# Patient Record
Sex: Female | Born: 1940 | Race: White | Hispanic: No | Smoking: Former smoker
Health system: Southern US, Community
[De-identification: ages and names within clinical notes are randomized; demographics above are authoritative.]

## PROBLEM LIST (undated history)

## (undated) DIAGNOSIS — M1990 Unspecified osteoarthritis, unspecified site: Secondary | ICD-10-CM

## (undated) DIAGNOSIS — E119 Type 2 diabetes mellitus without complications: Secondary | ICD-10-CM

## (undated) DIAGNOSIS — I071 Rheumatic tricuspid insufficiency: Secondary | ICD-10-CM

## (undated) DIAGNOSIS — I509 Heart failure, unspecified: Secondary | ICD-10-CM

## (undated) DIAGNOSIS — I272 Pulmonary hypertension, unspecified: Secondary | ICD-10-CM

## (undated) DIAGNOSIS — J449 Chronic obstructive pulmonary disease, unspecified: Secondary | ICD-10-CM

## (undated) HISTORY — PX: CARDIAC SURGERY: SHX584

---

## 2019-08-10 ENCOUNTER — Other Ambulatory Visit: Payer: Self-pay

## 2019-08-10 ENCOUNTER — Inpatient Hospital Stay: Payer: Medicare Other

## 2019-08-10 ENCOUNTER — Emergency Department: Payer: Medicare Other

## 2019-08-10 ENCOUNTER — Inpatient Hospital Stay
Admission: EM | Admit: 2019-08-10 | Discharge: 2019-08-14 | DRG: 481 | Disposition: A | Discharge status: Skilled Nursing Facility | Payer: Medicare Other | Attending: Internal Medicine | Admitting: Internal Medicine

## 2019-08-10 DIAGNOSIS — Z86718 Personal history of other venous thrombosis and embolism: Secondary | ICD-10-CM

## 2019-08-10 DIAGNOSIS — S72009A Fracture of unspecified part of neck of unspecified femur, initial encounter for closed fracture: Secondary | ICD-10-CM | POA: Diagnosis present

## 2019-08-10 DIAGNOSIS — I071 Rheumatic tricuspid insufficiency: Secondary | ICD-10-CM | POA: Diagnosis present

## 2019-08-10 DIAGNOSIS — R55 Syncope and collapse: Secondary | ICD-10-CM | POA: Diagnosis present

## 2019-08-10 DIAGNOSIS — I5032 Chronic diastolic (congestive) heart failure: Secondary | ICD-10-CM | POA: Diagnosis present

## 2019-08-10 DIAGNOSIS — E119 Type 2 diabetes mellitus without complications: Secondary | ICD-10-CM | POA: Diagnosis present

## 2019-08-10 DIAGNOSIS — Z885 Allergy status to narcotic agent status: Secondary | ICD-10-CM | POA: Diagnosis not present

## 2019-08-10 DIAGNOSIS — Z8249 Family history of ischemic heart disease and other diseases of the circulatory system: Secondary | ICD-10-CM

## 2019-08-10 DIAGNOSIS — T402X5A Adverse effect of other opioids, initial encounter: Secondary | ICD-10-CM | POA: Diagnosis present

## 2019-08-10 DIAGNOSIS — Z7902 Long term (current) use of antithrombotics/antiplatelets: Secondary | ICD-10-CM

## 2019-08-10 DIAGNOSIS — I110 Hypertensive heart disease with heart failure: Secondary | ICD-10-CM | POA: Diagnosis present

## 2019-08-10 DIAGNOSIS — J9610 Chronic respiratory failure, unspecified whether with hypoxia or hypercapnia: Secondary | ICD-10-CM | POA: Diagnosis present

## 2019-08-10 DIAGNOSIS — Z7951 Long term (current) use of inhaled steroids: Secondary | ICD-10-CM

## 2019-08-10 DIAGNOSIS — S72002A Fracture of unspecified part of neck of left femur, initial encounter for closed fracture: Secondary | ICD-10-CM

## 2019-08-10 DIAGNOSIS — Z87891 Personal history of nicotine dependence: Secondary | ICD-10-CM

## 2019-08-10 DIAGNOSIS — J449 Chronic obstructive pulmonary disease, unspecified: Secondary | ICD-10-CM | POA: Diagnosis present

## 2019-08-10 DIAGNOSIS — I272 Pulmonary hypertension, unspecified: Secondary | ICD-10-CM | POA: Diagnosis present

## 2019-08-10 DIAGNOSIS — Z888 Allergy status to other drugs, medicaments and biological substances status: Secondary | ICD-10-CM

## 2019-08-10 DIAGNOSIS — W010XXA Fall on same level from slipping, tripping and stumbling without subsequent striking against object, initial encounter: Secondary | ICD-10-CM | POA: Diagnosis present

## 2019-08-10 DIAGNOSIS — S72142A Displaced intertrochanteric fracture of left femur, initial encounter for closed fracture: Secondary | ICD-10-CM | POA: Diagnosis present

## 2019-08-10 DIAGNOSIS — S7222XA Displaced subtrochanteric fracture of left femur, initial encounter for closed fracture: Secondary | ICD-10-CM | POA: Diagnosis present

## 2019-08-10 DIAGNOSIS — Z6839 Body mass index (BMI) 39.0-39.9, adult: Secondary | ICD-10-CM | POA: Diagnosis not present

## 2019-08-10 DIAGNOSIS — Z20828 Contact with and (suspected) exposure to other viral communicable diseases: Secondary | ICD-10-CM | POA: Diagnosis present

## 2019-08-10 DIAGNOSIS — L299 Pruritus, unspecified: Secondary | ICD-10-CM | POA: Diagnosis present

## 2019-08-10 DIAGNOSIS — Z79899 Other long term (current) drug therapy: Secondary | ICD-10-CM

## 2019-08-10 DIAGNOSIS — Z794 Long term (current) use of insulin: Secondary | ICD-10-CM | POA: Diagnosis not present

## 2019-08-10 HISTORY — DX: Heart failure, unspecified: I50.9

## 2019-08-10 HISTORY — DX: Rheumatic tricuspid insufficiency: I07.1

## 2019-08-10 HISTORY — DX: Chronic obstructive pulmonary disease, unspecified: J44.9

## 2019-08-10 HISTORY — DX: Pulmonary hypertension, unspecified: I27.20

## 2019-08-10 HISTORY — DX: Unspecified osteoarthritis, unspecified site: M19.90

## 2019-08-10 HISTORY — DX: Type 2 diabetes mellitus without complications: E11.9

## 2019-08-10 LAB — CBC WITH DIFFERENTIAL/PLATELET
Abs Immature Granulocytes: 0.08 10*3/uL — ABNORMAL HIGH (ref 0.00–0.07)
Basophils Absolute: 0 10*3/uL (ref 0.0–0.1)
Basophils Relative: 0 %
Eosinophils Absolute: 0.2 10*3/uL (ref 0.0–0.5)
Eosinophils Relative: 2 %
HCT: 34.1 % — ABNORMAL LOW (ref 36.0–46.0)
Hemoglobin: 10.2 g/dL — ABNORMAL LOW (ref 12.0–15.0)
Immature Granulocytes: 1 %
Lymphocytes Relative: 15 %
Lymphs Abs: 1.6 10*3/uL (ref 0.7–4.0)
MCH: 24.8 pg — ABNORMAL LOW (ref 26.0–34.0)
MCHC: 29.9 g/dL — ABNORMAL LOW (ref 30.0–36.0)
MCV: 83 fL (ref 80.0–100.0)
Monocytes Absolute: 0.9 10*3/uL (ref 0.1–1.0)
Monocytes Relative: 8 %
Neutro Abs: 8 10*3/uL — ABNORMAL HIGH (ref 1.7–7.7)
Neutrophils Relative %: 74 %
Platelets: 220 10*3/uL (ref 150–400)
RBC: 4.11 MIL/uL (ref 3.87–5.11)
RDW: 16.9 % — ABNORMAL HIGH (ref 11.5–15.5)
WBC: 10.7 10*3/uL — ABNORMAL HIGH (ref 4.0–10.5)
nRBC: 0 % (ref 0.0–0.2)

## 2019-08-10 LAB — APTT: aPTT: 33 seconds (ref 24–36)

## 2019-08-10 LAB — BASIC METABOLIC PANEL
Anion gap: 11 (ref 5–15)
BUN: 20 mg/dL (ref 8–23)
CO2: 26 mmol/L (ref 22–32)
Calcium: 8.9 mg/dL (ref 8.9–10.3)
Chloride: 101 mmol/L (ref 98–111)
Creatinine, Ser: 1.35 mg/dL — ABNORMAL HIGH (ref 0.44–1.00)
GFR calc Af Amer: 43 mL/min — ABNORMAL LOW (ref >60)
GFR calc non Af Amer: 37 mL/min — ABNORMAL LOW (ref >60)
Glucose, Bld: 188 mg/dL — ABNORMAL HIGH (ref 70–99)
Potassium: 4.4 mmol/L (ref 3.5–5.1)
Sodium: 138 mmol/L (ref 135–145)

## 2019-08-10 LAB — SURGICAL PCR SCREEN
MRSA, PCR: NEGATIVE
Staphylococcus aureus: POSITIVE — AB

## 2019-08-10 LAB — TYPE AND SCREEN
ABO/RH(D): O POS
Antibody Screen: NEGATIVE

## 2019-08-10 LAB — TROPONIN I (HIGH SENSITIVITY): Troponin I (High Sensitivity): 14 ng/L (ref <18)

## 2019-08-10 LAB — SARS CORONAVIRUS 2 BY RT PCR (HOSPITAL ORDER, PERFORMED IN ~~LOC~~ HOSPITAL LAB): SARS Coronavirus 2: NEGATIVE

## 2019-08-10 LAB — PROTIME-INR
INR: 1.1 (ref 0.8–1.2)
Prothrombin Time: 14.4 seconds (ref 11.4–15.2)

## 2019-08-10 LAB — HEMOGLOBIN A1C
Hgb A1c MFr Bld: 8.3 % — ABNORMAL HIGH (ref 4.8–5.6)
Mean Plasma Glucose: 191.51 mg/dL

## 2019-08-10 LAB — GLUCOSE, CAPILLARY: Glucose-Capillary: 172 mg/dL — ABNORMAL HIGH (ref 70–99)

## 2019-08-10 MED ORDER — MORPHINE SULFATE (PF) 4 MG/ML IV SOLN
4.0000 mg | INTRAVENOUS | Status: DC | PRN
  Administered 2019-08-10: 4 mg via INTRAVENOUS
  Filled 2019-08-10: qty 1

## 2019-08-10 MED ORDER — CHLORHEXIDINE GLUCONATE CLOTH 2 % EX PADS
6.0000 | MEDICATED_PAD | Freq: Every day | CUTANEOUS | Status: DC
  Administered 2019-08-11 – 2019-08-14 (×2): 6 via TOPICAL

## 2019-08-10 MED ORDER — ONDANSETRON HCL 4 MG PO TABS: 4.0000 mg | ORAL_TABLET | Freq: Four times a day (QID) | ORAL | Status: DC | PRN

## 2019-08-10 MED ORDER — MORPHINE SULFATE (PF) 2 MG/ML IV SOLN
2.0000 mg | INTRAVENOUS | Status: DC | PRN
  Administered 2019-08-10: 2 mg via INTRAVENOUS
  Filled 2019-08-10: qty 1

## 2019-08-10 MED ORDER — INSULIN GLARGINE 100 UNIT/ML ~~LOC~~ SOLN
20.0000 [IU] | Freq: Every day | SUBCUTANEOUS | Status: DC
  Administered 2019-08-11: 20 [IU] via SUBCUTANEOUS
  Filled 2019-08-10 (×3): qty 0.2

## 2019-08-10 MED ORDER — MORPHINE SULFATE (PF) 2 MG/ML IV SOLN
2.0000 mg | INTRAVENOUS | Status: DC | PRN
  Administered 2019-08-10: 4 mg via INTRAVENOUS
  Administered 2019-08-11: 2 mg via INTRAVENOUS
  Filled 2019-08-10: qty 1
  Filled 2019-08-10: qty 2

## 2019-08-10 MED ORDER — OXYCODONE HCL 5 MG PO TABS
10.0000 mg | ORAL_TABLET | ORAL | Status: DC | PRN
  Administered 2019-08-10 – 2019-08-11 (×2): 10 mg via ORAL
  Filled 2019-08-10 (×2): qty 2

## 2019-08-10 MED ORDER — MUPIROCIN 2 % EX OINT
1.0000 "application " | TOPICAL_OINTMENT | Freq: Two times a day (BID) | CUTANEOUS | Status: DC
  Administered 2019-08-11 – 2019-08-14 (×5): 1 via NASAL
  Filled 2019-08-10: qty 22

## 2019-08-10 MED ORDER — BISACODYL 10 MG RE SUPP
10.0000 mg | Freq: Every day | RECTAL | Status: DC | PRN
  Administered 2019-08-14: 10 mg via RECTAL
  Filled 2019-08-10 (×2): qty 1

## 2019-08-10 MED ORDER — ACETAMINOPHEN 325 MG PO TABS
650.0000 mg | ORAL_TABLET | Freq: Four times a day (QID) | ORAL | Status: DC | PRN
  Administered 2019-08-12 – 2019-08-14 (×4): 650 mg via ORAL
  Filled 2019-08-10 (×4): qty 2

## 2019-08-10 MED ORDER — FENTANYL CITRATE (PF) 100 MCG/2ML IJ SOLN
50.0000 ug | INTRAMUSCULAR | Status: DC | PRN
  Administered 2019-08-10: 50 ug via INTRAVENOUS
  Filled 2019-08-10: qty 2

## 2019-08-10 MED ORDER — ACETAMINOPHEN 650 MG RE SUPP: 650.0000 mg | Freq: Four times a day (QID) | RECTAL | Status: DC | PRN

## 2019-08-10 MED ORDER — CEFAZOLIN SODIUM-DEXTROSE 2-4 GM/100ML-% IV SOLN
2.0000 g | Freq: Once | INTRAVENOUS | Status: AC
  Administered 2019-08-11: 08:00:00 2 g via INTRAVENOUS
  Filled 2019-08-10 (×2): qty 100

## 2019-08-10 MED ORDER — ONDANSETRON HCL 4 MG/2ML IJ SOLN
4.0000 mg | Freq: Four times a day (QID) | INTRAMUSCULAR | Status: DC | PRN
  Administered 2019-08-14: 4 mg via INTRAVENOUS

## 2019-08-10 MED ORDER — DOCUSATE SODIUM 100 MG PO CAPS
100.0000 mg | ORAL_CAPSULE | Freq: Two times a day (BID) | ORAL | Status: DC
  Administered 2019-08-10: 100 mg via ORAL
  Filled 2019-08-10 (×3): qty 1

## 2019-08-10 MED ORDER — ALBUTEROL SULFATE (2.5 MG/3ML) 0.083% IN NEBU: 2.5000 mg | INHALATION_SOLUTION | RESPIRATORY_TRACT | Status: DC | PRN

## 2019-08-10 MED ORDER — POLYETHYLENE GLYCOL 3350 17 G PO PACK
17.0000 g | PACK | Freq: Every day | ORAL | Status: DC | PRN
  Administered 2019-08-14: 17 g via ORAL
  Filled 2019-08-10: qty 1

## 2019-08-10 MED ORDER — INSULIN ASPART 100 UNIT/ML ~~LOC~~ SOLN
0.0000 [IU] | Freq: Three times a day (TID) | SUBCUTANEOUS | Status: DC
  Administered 2019-08-11: 3 [IU] via SUBCUTANEOUS
  Administered 2019-08-11: 7 [IU] via SUBCUTANEOUS
  Administered 2019-08-11: 5 [IU] via SUBCUTANEOUS
  Administered 2019-08-12 (×2): 7 [IU] via SUBCUTANEOUS
  Administered 2019-08-12: 5 [IU] via SUBCUTANEOUS
  Administered 2019-08-13 (×3): 2 [IU] via SUBCUTANEOUS
  Administered 2019-08-14: 3 [IU] via SUBCUTANEOUS
  Filled 2019-08-10 (×9): qty 1

## 2019-08-10 MED ORDER — ONDANSETRON HCL 4 MG/2ML IJ SOLN
4.0000 mg | INTRAMUSCULAR | Status: DC | PRN
  Administered 2019-08-10: 4 mg via INTRAVENOUS
  Filled 2019-08-10 (×2): qty 2

## 2019-08-10 MED ORDER — OXYCODONE HCL 5 MG PO TABS
5.0000 mg | ORAL_TABLET | ORAL | Status: DC | PRN
  Filled 2019-08-10: qty 1

## 2019-08-10 MED ORDER — INSULIN ASPART 100 UNIT/ML ~~LOC~~ SOLN
0.0000 [IU] | Freq: Every day | SUBCUTANEOUS | Status: DC
  Administered 2019-08-12: 3 [IU] via SUBCUTANEOUS
  Filled 2019-08-10 (×2): qty 1

## 2019-08-10 NOTE — ED Notes (Signed)
Pharmacy tech at bedside 

## 2019-08-10 NOTE — ED Notes (Signed)
Pt made aware bed available upstairs. Pt given graham crackers at bedside. Pt states "I got to sleep for a little bit" and made comfortable with blankets.

## 2019-08-10 NOTE — ED Notes (Signed)
Spoke with pt significant other on phone to confirm he is coming to hospital to visit pt per pt request.

## 2019-08-10 NOTE — ED Notes (Signed)
Rolling call given to 1A.

## 2019-08-10 NOTE — Consult Note (Addendum)
Reason for Consult: Left comminuted hip fracture Referring Physician: ER staff  Destiny Zuniga is an 78 y.o. female.  HPI: Patient is a 78 year old with multiple medical problems who was traveling from [facility] meeting up with daughter who had recent surgery.  She went to the bathroom and had a syncopal episode and was unable to bear weight.  She brought to the emergency room and found to have comminuted left reverse obliquity subtrochanteric hip fracture.  She reports that she normally is a minimal community ambulator she does not use a walker or cane but when she goes encouraged her she leans on the cart to ambulate.  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  She is on Plavix as well.  Past Medical History:  Diagnosis Date  . Arthritis   . CHF (congestive heart failure) ([place])   . COPD (chronic obstructive pulmonary disease) ([place])   . Diabetes mellitus without complication [facility])     Past Surgical History:  Procedure Laterality Date  . CARDIAC SURGERY      No family history on file.  Social History:  reports that she has quit smoking. She has never used smokeless tobacco. She reports previous alcohol use. She reports that she does not use drugs.  Allergies: Not on File  Medications: I have reviewed the patient's current medications.  Results for orders placed or performed during the hospital encounter of 08/10/19 (from the past 48 hour(s))  Basic metabolic panel     Status: Abnormal   Collection Time: 08/10/19 11:40 AM  Result Value Ref Range   Sodium 138 135 - 145 mmol/L   Potassium 4.4 3.5 - 5.1 mmol/L   Chloride 101 98 - 111 mmol/L   CO2 26 22 - 32 mmol/L   Glucose, Bld 188 (H) 70 - 99 mg/dL   BUN 20 8 - 23 mg/dL   Creatinine, Ser 1.35 (H) 0.44 - 1.00 mg/dL   Calcium 8.9 8.9 - 10.3 mg/dL   GFR calc non Af Amer 37 (L) >60 mL/min   GFR calc Af Amer 43 (L) >60 mL/min   Anion gap 11 5 - 15    Comment:  Performed at [facility], [address] [place], [place] [address]  CBC WITH DIFFERENTIAL     Status: Abnormal   Collection Time: 08/10/19 11:40 AM  Result Value Ref Range   WBC 10.7 (H) 4.0 - 10.5 K/uL   RBC 4.11 3.87 - 5.11 MIL/uL   Hemoglobin 10.2 (L) 12.0 - 15.0 g/dL   HCT 34.1 (L) 36.0 - 46.0 %   MCV 83.0 80.0 - 100.0 fL   MCH 24.8 (L) 26.0 - 34.0 pg   MCHC 29.9 (L) 30.0 - 36.0 g/dL   RDW 16.9 (H) 11.5 - 15.5 %   Platelets 220 150 - 400 K/uL   nRBC 0.0 0.0 - 0.2 %   Neutrophils Relative % 74 %   Neutro Abs 8.0 (H) 1.7 - 7.7 K/uL   Lymphocytes Relative 15 %   Lymphs Abs 1.6 0.7 - 4.0 K/uL   Monocytes Relative 8 %   Monocytes Absolute 0.9 0.1 - 1.0 K/uL   Eosinophils Relative 2 %   Eosinophils Absolute 0.2 0.0 - 0.5 K/uL   Basophils Relative 0 %   Basophils Absolute 0.0 0.0 - 0.1 K/uL   Immature Granulocytes 1 %   Abs Immature Granulocytes 0.08 (H) 0.00 - 0.07 K/uL    Comment: Performed  at [facility], [address] [place], [place] [address]  Protime-INR     Status: None   Collection Time: 08/10/19 11:40 AM  Result Value Ref Range   Prothrombin Time 14.4 11.4 - 15.2 seconds   INR 1.1 0.8 - 1.2    Comment: (NOTE) INR goal varies based on device and disease states. Performed at [facility], [address] [place], [place] [address]   Type and screen [facility]     Status: None   Collection Time: 08/10/19 11:40 AM  Result Value Ref Range   ABO/RH(D) O POS    Antibody Screen NEG    Sample Expiration      08/13/2019,2359 Performed at [facility], [address] [place], [place] [address]   APTT     Status: None   Collection Time: 08/10/19 11:40 AM  Result Value Ref Range   aPTT 33 24 - 36 seconds    Comment: Performed at [facility], [address] [place], [place] [address]    Dg Hip Unilat With Pelvis 2-3 Views Left  Result Date: 08/10/2019 CLINICAL DATA:  Left hip pain you a  fall while trying to open a door today. Initial encounter. EXAM: DG HIP (WITH OR WITHOUT PELVIS) 2-3V LEFT COMPARISON:  None. FINDINGS: The patient has an intertrochanteric fracture on the left with subtrochanteric extension. The left femoral head is located. No other acute abnormality seen. IMPRESSION: Acute left intertrochanteric fracture with subtrochanteric extension. Electronically Signed   By: Drusilla Kanner M.D.   On: 08/10/2019 12:28    ROS Blood pressure (!) 143/68, pulse (!) 54, temperature (!) 97.5 F (36.4 C), temperature source Oral, resp. rate 14, height 5\' 2"  (1.575 m), weight 98 kg, SpO2 99 %. Physical Exam  Left leg is shortened and externally rotated with 2+ pitting edema in both lower extremities.  Trace dorsalis pedis pulse nonpalpable posterior tib pulse.  Skin is intact around the hip.  No ecchymosis noted.  She is able to flex extend the toes and has intact sensation to the foot. Radiographs show comminuted reverse obliquity subtrochanteric troches fracture with lesser trochanter also fractured and displaced.  Assessment/Plan: Unstable fracture hip fracture pattern.  I reviewed the x-rays with the patient and discussed surgical intervention that this is a fracture that is slow in healing and has potential for displacement will plan on ORIF tomorrow morning with intramedullary device and probably cerclage wire.  Risk of surgery including blood clot were discussed and she does have a history of blood clots in the past. Site marked, left proximal femur.  08/10/2019, 12:55 PM

## 2019-08-10 NOTE — H&P (Signed)
[place] [facility] [place] NAME: Destiny Zuniga    MR#:  [id number]  DATE OF BIRTH:  02-May-1941  DATE OF ADMISSION:  08/10/2019  PRIMARY CARE PHYSICIAN: System, Pcp Not In   REQUESTING/REFERRING PHYSICIAN: Dr. Charna Archer  CHIEF COMPLAINT:   Chief Complaint  Patient presents with  . Fall    HISTORY OF PRESENT ILLNESS:  Destiny Zuniga  is a 78 y.o. female with a known history of diabetes mellitus, COPD, chronic respiratory failure, diastolic CHF, tricuspid regurgitation, moderate to severe pulmonary hypertension, chronic shortness of breath and dizziness presents to the emergency room after she fell landing on her left hip.  Patient was stepping over the curb and reaching for the door got lightheaded and fell.  Loss consciousness.  Unclear if she hit her head.  Presently feels back to normal.  Found to have left hip fracture.  Surgery scheduled for tomorrow with orthopedics.  Patient has had chronic shortness of breath on exertion and lightheadedness.  She follows at [facility] with cardiology and pulmonary.  Symptoms were thought to be due to tricuspid regurgitation and pulmonary hypertension with chronic shortness of breath.  PAST MEDICAL HISTORY:   Past Medical History:  Diagnosis Date  . Arthritis   . CHF (congestive heart failure) ([place])   . COPD (chronic obstructive pulmonary disease) ([place])   . Diabetes mellitus without complication ([place])   . Pulmonary hypertension ([place])   . Tricuspid regurgitation     PAST SURGICAL HISTORY:   Past Surgical History:  Procedure Laterality Date  . CARDIAC SURGERY      SOCIAL HISTORY:   Social History   Tobacco Use  . Smoking status: Former Research scientist (life sciences)  . Smokeless tobacco: Never Used  Substance Use Topics  . Alcohol use: Not Currently    FAMILY HISTORY:  History reviewed. No pertinent family history. Mother - heart disease DRUG ALLERGIES:  Not on File  REVIEW OF SYSTEMS:   Review of Systems   Constitutional: Positive for malaise/fatigue. Negative for chills, fever and weight loss.  HENT: Negative for hearing loss and nosebleeds.   Eyes: Negative for blurred vision, double vision and pain.  Respiratory: Positive for shortness of breath (chronic). Negative for cough, hemoptysis, sputum production and wheezing.   Cardiovascular: Negative for chest pain, palpitations, orthopnea and leg swelling.  Gastrointestinal: Negative for abdominal pain, constipation, diarrhea, nausea and vomiting.  Genitourinary: Negative for dysuria and hematuria.  Musculoskeletal: Positive for falls and joint pain. Negative for back pain and myalgias.  Skin: Negative for rash.  Neurological: Negative for dizziness, tremors, sensory change, speech change, focal weakness, seizures and headaches.  Endo/Heme/Allergies: Does not bruise/bleed easily.  Psychiatric/Behavioral: Negative for depression and memory loss. The patient is not nervous/anxious.     MEDICATIONS AT HOME:   Prior to Admission medications   Not on File     VITAL SIGNS:  Blood pressure (!) 143/68, pulse (!) 54, temperature (!) 97.5 F (36.4 C), temperature source Oral, resp. rate 14, height 5\' 2"  (1.575 m), weight 98 kg, SpO2 99 %.  PHYSICAL EXAMINATION:  Physical Exam  GENERAL:  78 y.o.-year-old patient lying in the bed with no acute distress.  Obese EYES: Pupils equal, round, reactive to light and accommodation. No scleral icterus. Extraocular muscles intact.  HEENT: Head atraumatic, normocephalic. Oropharynx and nasopharynx clear. No oropharyngeal erythema, moist oral mucosa  NECK:  Supple, no jugular venous distention. No thyroid enlargement, no tenderness.  LUNGS: Normal breath sounds bilaterally, no wheezing, rales,  rhonchi. No use of accessory muscles of respiration.  CARDIOVASCULAR: S1, S2 normal. No murmurs, rubs, or gallops.  ABDOMEN: Soft, nontender, nondistended. Bowel sounds present. No organomegaly or mass.  EXTREMITIES:  No pedal edema, cyanosis, or clubbing. + 2 pedal & radial pulses b/l.   NEUROLOGIC: Cranial nerves II through XII are intact. No focal Motor or sensory deficits appreciated b/l PSYCHIATRIC: The patient is alert and oriented x 3. Good affect.  SKIN: No obvious rash, lesion, or ulcer.   LABORATORY PANEL:   CBC Recent Labs  Lab 08/10/19 1140  WBC 10.7*  HGB 10.2*  HCT 34.1*  PLT 220   ------------------------------------------------------------------------------------------------------------------  Chemistries  Recent Labs  Lab 08/10/19 1140  NA 138  K 4.4  CL 101  CO2 26  GLUCOSE 188*  BUN 20  CREATININE 1.35*  CALCIUM 8.9   ------------------------------------------------------------------------------------------------------------------  Cardiac Enzymes No results for input(s): TROPONINI in the last 168 hours. ------------------------------------------------------------------------------------------------------------------  RADIOLOGY:  Dg Hip Unilat With Pelvis 2-3 Views Left  Result Date: 08/10/2019 CLINICAL DATA:  Left hip pain you a fall while trying to open a door today. Initial encounter. EXAM: DG HIP (WITH OR WITHOUT PELVIS) 2-3V LEFT COMPARISON:  None. FINDINGS: The patient has an intertrochanteric fracture on the left with subtrochanteric extension. The left femoral head is located. No other acute abnormality seen. IMPRESSION: Acute left intertrochanteric fracture with subtrochanteric extension. Electronically Signed   By: Drusilla Kanner M.D.   On: 08/10/2019 12:28     IMPRESSION AND PLAN:   * Acute left intertrochanteric fracture with subtrochanteric extension. Discussed with Dr. Rosita Kea of orthopedics.  Surgery scheduled for tomorrow 8 AM. No contraindications for surgery.  Would be moderate risk secondary to her moderate to severe pulmonary hypertension and chronic respiratory failure with COPD. Pain medications as needed Physical therapy after surgery SNF  at discharge SCDs for DVT prophylaxis at this time.  Lovenox or heparin per orthopedic team after surgery  *Syncope.  Patient has chronic lightheadedness and presyncope.  Today she fell and lost consciousness.  Will check CT head as it is unclear if she hit her head or not.  Will need outpatient follow-up with cardiology to finish work-up.  *Diabetes mellitus.  Sliding scale insulin added.  Diabetic diet.  We will continue Lantus from home.  *Hypertension.  Continue home medications  *Chronic diastolic CHF.  No signs of fluid overload.  Continue home medications when available  *COPD with chronic respiratory failure.  Continue oxygen, nebulizers and inhalers  All the records are reviewed and case discussed with ED provider. Management plans discussed with the patient, family and they are in agreement.  CODE STATUS: Full code  TOTAL TIME TAKING CARE OF THIS PATIENT: 40 minutes.   Molinda Bailiff Tereasa Yilmaz M.D on 08/10/2019 at 1:13 PM  Between 7am to 6pm - Pager - [phone]  After 6pm go to [email or web address] - password [email or web address]  [facility]  Office  [phone]  CC: Primary care physician; System, Pcp Not In  Note: This dictation was prepared with Dragon dictation along with smaller phrase technology. Any transcriptional errors that result from this process are unintentional.

## 2019-08-10 NOTE — ED Notes (Signed)
Attempted to call floor to give report. Nurse went in to change PICC dressing per secretary Helene Kelp and will call back when done. This RN's phone number given to [place].

## 2019-08-10 NOTE — ED Notes (Signed)
Admitting MD and Orthopedic surgeon at bedside at this time.

## 2019-08-10 NOTE — Progress Notes (Signed)
Advance care planning  Purpose of Encounter Left hip fracture  Parties in Attendance Patient  Patients Decisional capacity Alert and oriented.  Able to make medical decisions.  Documented healthcare power of attorney is her significant other Destiny Zuniga.  Discussed in detail regarding left hip fracture, complications.  Treatment plan , prognosis discussed.  All questions answered  CODE STATUS discussed.  Patient wants to wait and discuss discuss further with her significant other and think about it prior to making a decision.  Orders entered and full CODE STATUS ordered.  FULL CODE  Time spent - 17 minutes

## 2019-08-10 NOTE — ED Notes (Signed)
Patient transported to X-ray 

## 2019-08-10 NOTE — ED Triage Notes (Signed)
Pt arrives via EMS with cc of fall. Witnessed by boyfriend. Pt reports falling while trying to open the door but "doesn't remember between just came to on the ground". Witness reports pt fell while stepping over curb and opening door.  BP 145/69  BS 133  O2 98% on 2L pt uses oxygen at home P 66 T 98.6 oral  Pt reports taking 100mg  tramadol at 0800. Pt takes this medication morning and night.

## 2019-08-10 NOTE — ED Notes (Signed)
Admitting MD made aware of pt c/o 10/10 pain after IV morphine. This RN requested possible change in dose/frequency of IV medication to treat severe pain. Pt made aware of reason for delay in receiving pain meds.

## 2019-08-10 NOTE — ED Provider Notes (Signed)
[facility] Emergency Department Provider Note   ____________________________________________   First MD Initiated Contact with Patient 08/10/19 1202     (approximate)  I have reviewed the triage vital signs and the nursing notes.   HISTORY  Chief Complaint Fall    HPI Destiny Zuniga is a 78 y.o. female with past medical history of CHF, COPD on 2 L nasal cannula, and diabetes presents to the ED following syncopal episode and fall.  Patient reports she has had intermittent episodes of lightheadedness and near syncope for about the past 2 months.  She again had an episode today [facility], attempted to steady herself on a door but slipped and fell to the ground.  She reports hitting her left hip and complains of severe pain there, but denies hitting her head.  She does not believe she fully lost consciousness.  She denies any headache, neck pain, upper extremity pain, chest pain, or abdominal pain.  She has been unable to bear weight on her left leg since the fall and was transported to the ED via EMS.        Past Medical History:  Diagnosis Date  . Arthritis   . CHF (congestive heart failure) (HCC)   . COPD (chronic obstructive pulmonary disease) (HCC)   . Diabetes mellitus without complication (HCC)   . Pulmonary hypertension (HCC)   . Tricuspid regurgitation     Patient Active Problem List   Diagnosis Date Noted  . Hip fracture (HCC) 08/10/2019    Past Surgical History:  Procedure Laterality Date  . CARDIAC SURGERY      Prior to Admission medications   Medication Sig Start Date End Date Taking? Authorizing Provider  albuterol (VENTOLIN HFA) 108 (90 Base) MCG/ACT inhaler Inhale 2 puffs into the lungs every 6 (six) hours as needed for wheezing or shortness of breath. 05/15/19  Yes [provider]  carvedilol (COREG) 3.125 MG tablet Take 3.125 mg by mouth 2 (two) times daily. 12/25/18 12/25/19 Yes [provider]   cetirizine (ZYRTEC) 10 MG tablet Take 10 mg by mouth daily as needed for allergies.    Yes [provider]  clopidogrel (PLAVIX) 75 MG tablet Take 75 mg by mouth daily. 02/10/19  Yes [provider]  diclofenac sodium (VOLTAREN) 1 % GEL Apply 4 g topically 3 (three) times daily as needed (joint pain). (apply to hands and knees) 04/04/19  Yes [provider]  fluticasone (FLONASE) 50 MCG/ACT nasal spray Place 2 sprays into both nostrils daily. 12/06/18  Yes [provider]  Fluticasone-Umeclidin-Vilant (TRELEGY ELLIPTA) 100-62.5-25 MCG/INH AEPB Inhale 1 puff into the lungs daily.   Yes [provider]  furosemide (LASIX) 20 MG tablet Take 40 mg by mouth 2 (two) times daily.  01/08/19  Yes [provider]  gabapentin (NEURONTIN) 600 MG tablet Take 1,200-1,800 mg by mouth See admin instructions. Take 2 tablets (1200mg ) by mouth every morning and take 3 tablets (1800mg ) by mouth every night 11/14/18  Yes [provider]  insulin aspart (NOVOLOG) 100 UNIT/ML injection Inject 20-30 Units into the skin 3 (three) times daily with meals. (plus adjustments as directed) 03/23/19  Yes [provider]  Insulin Detemir (LEVEMIR) 100 UNIT/ML Pen Inject 25 Units into the skin daily with supper. 07/12/19  Yes [provider]  isosorbide mononitrate (IMDUR) 30 MG 24 hr tablet Take 30 mg by mouth 2 (two) times daily.  03/06/19  Yes [provider]  levothyroxine (SYNTHROID) 125 MCG  tablet Take 125 mcg by mouth daily. 02/19/19  Yes [provider]  losartan (COZAAR) 100 MG tablet Take 100 mg by mouth daily. 12/15/18  Yes [provider]  nitroGLYCERIN (NITROLINGUAL) 0.4 MG/SPRAY spray Place 1 spray under the tongue every 5 (five) minutes as needed for chest pain. 12/20/18  Yes [provider]  omeprazole (PRILOSEC) 20 MG capsule Take 20 mg by mouth daily. 12/15/18  Yes [provider]  potassium chloride  (KLOR-CON) 10 MEQ tablet Take 10 mEq by mouth daily. 04/03/19  Yes [provider]  simvastatin (ZOCOR) 20 MG tablet Take 20 mg by mouth daily. 01/21/19  Yes [provider]  traMADol (ULTRAM) 50 MG tablet Take 50-100 mg by mouth every 6 (six) hours as needed for moderate pain.  07/27/19 08/26/19 Yes [provider]    Allergies Codeine, Celecoxib, Midazolam, and Lisinopril  Family History  Problem Relation Age of Onset  . Heart disease Mother     Social History Social History   Tobacco Use  . Smoking status: Former Games developer  . Smokeless tobacco: Never Used  Substance Use Topics  . Alcohol use: Not Currently  . Drug use: Never    Review of Systems  Constitutional: No fever/chills Eyes: No visual changes. ENT: No sore throat. Cardiovascular: Denies chest pain.  Positive for syncope. Respiratory: Denies shortness of breath. Gastrointestinal: No abdominal pain.  No nausea, no vomiting.  No diarrhea.  No constipation. Genitourinary: Negative for dysuria. Musculoskeletal: Negative for back pain.  Positive for left hip pain. Skin: Negative for rash. Neurological: Negative for headaches, focal weakness or numbness.  ____________________________________________   PHYSICAL EXAM:  VITAL SIGNS: ED Triage Vitals  Enc Vitals Group     BP 08/10/19 1138 (!) 143/68     Pulse Rate 08/10/19 1138 (!) 54     Resp 08/10/19 1138 14     Temp 08/10/19 1138 (!) 97.5 F (36.4 C)     Temp Source 08/10/19 1138 Oral     SpO2 08/10/19 1138 99 %     Weight 08/10/19 1140 216 lb (98 kg)     Height 08/10/19 1140 5\' 2"  (1.575 m)     Head Circumference --      Peak Flow --      Pain Score 08/10/19 1139 10     Pain Loc --      Pain Edu? --      Excl. in GC? --     Constitutional: Alert and oriented. Eyes: Conjunctivae are normal. Head: Atraumatic. Nose: No congestion/rhinnorhea. Mouth/Throat: Mucous membranes are moist. Neck: Normal ROM, no midline cervical spine  tenderness. Cardiovascular: Normal rate, regular rhythm. Grossly normal heart sounds. Respiratory: Normal respiratory effort.  No retractions. Lungs CTAB. Gastrointestinal: Soft and nontender. No distention. Genitourinary: deferred Musculoskeletal: Range of motion at left hip limited secondary to pain.  Left lower extremity is shortened and externally rotated.  2+ DP pulses bilaterally with sensation intact to bilateral lower extremities. Neurologic:  Normal speech and language. No gross focal neurologic deficits are appreciated. Skin:  Skin is warm, dry and intact. No rash noted. Psychiatric: Mood and affect are normal. Speech and behavior are normal.  ____________________________________________   LABS (all labs ordered are listed, but only abnormal results are displayed)  Labs Reviewed  BASIC METABOLIC PANEL - Abnormal; Notable for the following components:      Result Value   Glucose, Bld 188 (*)    Creatinine, Ser 1.35 (*)    GFR calc non  Af Amer 37 (*)    GFR calc Af Amer 43 (*)    All other components within normal limits  CBC WITH DIFFERENTIAL/PLATELET - Abnormal; Notable for the following components:   WBC 10.7 (*)    Hemoglobin 10.2 (*)    HCT 34.1 (*)    MCH 24.8 (*)    MCHC 29.9 (*)    RDW 16.9 (*)    Neutro Abs 8.0 (*)    Abs Immature Granulocytes 0.08 (*)    All other components within normal limits  SARS CORONAVIRUS 2 BY RT PCR (HOSPITAL ORDER, [facility])  PROTIME-INR  APTT  HEMOGLOBIN A1C  TYPE AND SCREEN  TROPONIN I (HIGH SENSITIVITY)   ____________________________________________  EKG  ED ECG REPORT I, Blake Divine, the attending physician, personally viewed and interpreted this ECG.   Date: 08/10/2019  EKG Time: 11:33  Rate: 61  Rhythm: normal sinus rhythm  Axis: RAD  Intervals:none  ST&T Change: None   PROCEDURES  Procedure(s) performed (including Critical Care):  Procedures    ____________________________________________   INITIAL IMPRESSION / ASSESSMENT AND PLAN / ED COURSE       78 year old female with history of COPD on 2 L nasal cannula presents to the ED following near syncopal episode and fall to the ground.  She states she did not hit her head or fully lose consciousness and she has a benign and nonfocal neurologic exam here, doubt intracranial process.  No cervical spine tenderness or limitation in range of motion to suggest cervical spine injury.  She does have obvious deformity to left hip with x-ray showing intertrochanteric fracture.  She is neurovascularly intact to her left lower extremity.  Case discussed with Dr. Rudene Christians of orthopedic surgery, who will tentatively plan on operative repair tomorrow morning.  For her syncopal work-up, EKG is unremarkable and I am not able to appreciate a murmur on exam.  Troponin is within normal limits.  Patient reports she has had similar episodes intermittently over the past couple of months, no echo in our system or care everywhere to review.  Case discussed with hospitalist, who accepts patient for admission.      ____________________________________________   FINAL CLINICAL IMPRESSION(S) / ED DIAGNOSES  Final diagnoses:  Closed fracture of left hip, initial encounter ([place])  Syncope, unspecified syncope type     ED Discharge Orders    None       Note:  This document was prepared using Dragon voice recognition software and may include unintentional dictation errors.   Blake Divine, MD 08/10/19 1401

## 2019-08-10 NOTE — ED Notes (Signed)
This RN spoke with Dr. Darvin Neighbours via secure chat. Per Dr. Darvin Neighbours increase IV morphine to 4mg  q4hrs. Per Dr. Darvin Neighbours give repeat dose of Morphine now to get patient's pain under control.

## 2019-08-10 NOTE — ED Notes (Signed)
Per Dr. Charna Archer, okay to discontinue repeat troponin.

## 2019-08-10 NOTE — ED Notes (Signed)
This RN to room, lights remain dimmed for patient comfort. Pt resting in bed with NAD noted. VSS. Will continue to monitor for further patient needs.

## 2019-08-11 ENCOUNTER — Inpatient Hospital Stay: Payer: Medicare Other | Admitting: Anesthesiology

## 2019-08-11 ENCOUNTER — Encounter: Payer: Self-pay | Admitting: Anesthesiology

## 2019-08-11 ENCOUNTER — Encounter
Admission: EM | Disposition: A | Discharge status: Skilled Nursing Facility | Payer: Self-pay | Source: Home / Self Care | Attending: Internal Medicine

## 2019-08-11 ENCOUNTER — Inpatient Hospital Stay: Payer: Medicare Other

## 2019-08-11 HISTORY — PX: INTRAMEDULLARY (IM) NAIL INTERTROCHANTERIC: SHX5875

## 2019-08-11 LAB — BASIC METABOLIC PANEL
Anion gap: 12 (ref 5–15)
BUN: 20 mg/dL (ref 8–23)
CO2: 26 mmol/L (ref 22–32)
Calcium: 8.9 mg/dL (ref 8.9–10.3)
Chloride: 100 mmol/L (ref 98–111)
Creatinine, Ser: 1.24 mg/dL — ABNORMAL HIGH (ref 0.44–1.00)
GFR calc Af Amer: 48 mL/min — ABNORMAL LOW (ref >60)
GFR calc non Af Amer: 41 mL/min — ABNORMAL LOW (ref >60)
Glucose, Bld: 233 mg/dL — ABNORMAL HIGH (ref 70–99)
Potassium: 4.7 mmol/L (ref 3.5–5.1)
Sodium: 138 mmol/L (ref 135–145)

## 2019-08-11 LAB — GLUCOSE, CAPILLARY
Glucose-Capillary: 193 mg/dL — ABNORMAL HIGH (ref 70–99)
Glucose-Capillary: 224 mg/dL — ABNORMAL HIGH (ref 70–99)
Glucose-Capillary: 325 mg/dL — ABNORMAL HIGH (ref 70–99)
Glucose-Capillary: 397 mg/dL — ABNORMAL HIGH (ref 70–99)

## 2019-08-11 LAB — CBC
HCT: 33 % — ABNORMAL LOW (ref 36.0–46.0)
Hemoglobin: 9.9 g/dL — ABNORMAL LOW (ref 12.0–15.0)
MCH: 24.8 pg — ABNORMAL LOW (ref 26.0–34.0)
MCHC: 30 g/dL (ref 30.0–36.0)
MCV: 82.5 fL (ref 80.0–100.0)
Platelets: 215 10*3/uL (ref 150–400)
RBC: 4 MIL/uL (ref 3.87–5.11)
RDW: 16.8 % — ABNORMAL HIGH (ref 11.5–15.5)
WBC: 12.6 10*3/uL — ABNORMAL HIGH (ref 4.0–10.5)
nRBC: 0 % (ref 0.0–0.2)

## 2019-08-11 SURGERY — FIXATION, FRACTURE, INTERTROCHANTERIC, WITH INTRAMEDULLARY ROD
Anesthesia: General | Laterality: Left

## 2019-08-11 MED ORDER — ROCURONIUM BROMIDE 50 MG/5ML IV SOLN
INTRAVENOUS | Status: AC
  Filled 2019-08-11: qty 1

## 2019-08-11 MED ORDER — SIMVASTATIN 20 MG PO TABS
20.0000 mg | ORAL_TABLET | Freq: Every day | ORAL | Status: DC
  Administered 2019-08-11 – 2019-08-13 (×3): 20 mg via ORAL
  Filled 2019-08-11 (×3): qty 1

## 2019-08-11 MED ORDER — SUCCINYLCHOLINE CHLORIDE 20 MG/ML IJ SOLN
INTRAMUSCULAR | Status: DC | PRN
  Administered 2019-08-11: 100 mg via INTRAVENOUS

## 2019-08-11 MED ORDER — NEOMYCIN-POLYMYXIN B GU 40-200000 IR SOLN
Status: AC
  Filled 2019-08-11: qty 2

## 2019-08-11 MED ORDER — POTASSIUM CHLORIDE CRYS ER 10 MEQ PO TBCR: 10.0000 meq | EXTENDED_RELEASE_TABLET | Freq: Every day | ORAL | Status: DC

## 2019-08-11 MED ORDER — DOCUSATE SODIUM 100 MG PO CAPS
100.0000 mg | ORAL_CAPSULE | Freq: Two times a day (BID) | ORAL | Status: DC
  Administered 2019-08-12 – 2019-08-14 (×4): 100 mg via ORAL
  Filled 2019-08-11 (×5): qty 1

## 2019-08-11 MED ORDER — PHENYLEPHRINE HCL (PRESSORS) 10 MG/ML IV SOLN
INTRAVENOUS | Status: DC | PRN
  Administered 2019-08-11: 100 ug via INTRAVENOUS
  Administered 2019-08-11: 50 ug via INTRAVENOUS
  Administered 2019-08-11 (×2): 100 ug via INTRAVENOUS

## 2019-08-11 MED ORDER — HYDROCODONE-ACETAMINOPHEN 7.5-325 MG PO TABS: 1.0000 | ORAL_TABLET | Freq: Four times a day (QID) | ORAL | Status: DC | PRN

## 2019-08-11 MED ORDER — ACETAMINOPHEN 10 MG/ML IV SOLN
INTRAVENOUS | Status: AC
  Filled 2019-08-11: qty 100

## 2019-08-11 MED ORDER — EPHEDRINE SULFATE 50 MG/ML IJ SOLN
INTRAMUSCULAR | Status: DC | PRN
  Administered 2019-08-11 (×2): 10 mg via INTRAVENOUS
  Administered 2019-08-11: 15 mg via INTRAVENOUS

## 2019-08-11 MED ORDER — SUGAMMADEX SODIUM 200 MG/2ML IV SOLN
INTRAVENOUS | Status: DC | PRN
  Administered 2019-08-11 (×2): 200 mg via INTRAVENOUS

## 2019-08-11 MED ORDER — ASPIRIN EC 325 MG PO TBEC
325.0000 mg | DELAYED_RELEASE_TABLET | Freq: Every day | ORAL | Status: DC
  Administered 2019-08-12 – 2019-08-14 (×3): 325 mg via ORAL
  Filled 2019-08-11 (×3): qty 1

## 2019-08-11 MED ORDER — ZOLPIDEM TARTRATE 5 MG PO TABS: 5.0000 mg | ORAL_TABLET | Freq: Every evening | ORAL | Status: DC | PRN

## 2019-08-11 MED ORDER — MORPHINE SULFATE (PF) 2 MG/ML IV SOLN: 2.0000 mg | INTRAVENOUS | Status: DC | PRN

## 2019-08-11 MED ORDER — LEVOTHYROXINE SODIUM 125 MCG PO TABS
125.0000 ug | ORAL_TABLET | Freq: Every day | ORAL | Status: DC
  Administered 2019-08-12 – 2019-08-14 (×3): 125 ug via ORAL
  Filled 2019-08-11 (×3): qty 1

## 2019-08-11 MED ORDER — MAGNESIUM CITRATE PO SOLN
1.0000 | Freq: Once | ORAL | Status: DC | PRN
  Filled 2019-08-11: qty 296

## 2019-08-11 MED ORDER — FENTANYL CITRATE (PF) 100 MCG/2ML IJ SOLN
25.0000 ug | INTRAMUSCULAR | Status: DC | PRN
  Administered 2019-08-11 (×3): 25 ug via INTRAVENOUS

## 2019-08-11 MED ORDER — FUROSEMIDE 40 MG PO TABS
40.0000 mg | ORAL_TABLET | Freq: Two times a day (BID) | ORAL | Status: DC
  Administered 2019-08-11 – 2019-08-14 (×6): 40 mg via ORAL
  Filled 2019-08-11 (×6): qty 1

## 2019-08-11 MED ORDER — PANTOPRAZOLE SODIUM 40 MG PO TBEC
40.0000 mg | DELAYED_RELEASE_TABLET | Freq: Every day | ORAL | Status: DC
  Administered 2019-08-11 – 2019-08-14 (×4): 40 mg via ORAL
  Filled 2019-08-11 (×4): qty 1

## 2019-08-11 MED ORDER — MORPHINE SULFATE (PF) 2 MG/ML IV SOLN
2.0000 mg | INTRAVENOUS | Status: DC | PRN
  Administered 2019-08-14: 2 mg via INTRAVENOUS
  Filled 2019-08-11: qty 1

## 2019-08-11 MED ORDER — PROPOFOL 10 MG/ML IV BOLUS
INTRAVENOUS | Status: DC | PRN
  Administered 2019-08-11: 140 mg via INTRAVENOUS

## 2019-08-11 MED ORDER — SUGAMMADEX SODIUM 200 MG/2ML IV SOLN
INTRAVENOUS | Status: AC
  Filled 2019-08-11: qty 2

## 2019-08-11 MED ORDER — GABAPENTIN 600 MG PO TABS
1800.0000 mg | ORAL_TABLET | Freq: Every day | ORAL | Status: DC
  Administered 2019-08-11 – 2019-08-13 (×3): 1800 mg via ORAL
  Filled 2019-08-11 (×4): qty 3

## 2019-08-11 MED ORDER — TRAMADOL HCL 50 MG PO TABS
100.0000 mg | ORAL_TABLET | Freq: Four times a day (QID) | ORAL | Status: DC | PRN
  Administered 2019-08-11 – 2019-08-13 (×5): 100 mg via ORAL
  Filled 2019-08-11 (×5): qty 2

## 2019-08-11 MED ORDER — PHENYLEPHRINE HCL (PRESSORS) 10 MG/ML IV SOLN
INTRAVENOUS | Status: AC
  Filled 2019-08-11: qty 1

## 2019-08-11 MED ORDER — ACETAMINOPHEN 10 MG/ML IV SOLN
INTRAVENOUS | Status: DC | PRN
  Administered 2019-08-11: 1000 mg via INTRAVENOUS

## 2019-08-11 MED ORDER — FENTANYL CITRATE (PF) 100 MCG/2ML IJ SOLN
INTRAMUSCULAR | Status: AC
  Filled 2019-08-11: qty 2

## 2019-08-11 MED ORDER — DEXAMETHASONE SODIUM PHOSPHATE 10 MG/ML IJ SOLN
INTRAMUSCULAR | Status: AC
  Filled 2019-08-11: qty 1

## 2019-08-11 MED ORDER — DEXAMETHASONE SODIUM PHOSPHATE 10 MG/ML IJ SOLN
INTRAMUSCULAR | Status: DC | PRN
  Administered 2019-08-11: 10 mg via INTRAVENOUS

## 2019-08-11 MED ORDER — MENTHOL 3 MG MT LOZG
1.0000 | LOZENGE | OROMUCOSAL | Status: DC | PRN
  Filled 2019-08-11: qty 9

## 2019-08-11 MED ORDER — LIDOCAINE HCL (CARDIAC) PF 100 MG/5ML IV SOSY
PREFILLED_SYRINGE | INTRAVENOUS | Status: DC | PRN
  Administered 2019-08-11: 100 mg via INTRAVENOUS

## 2019-08-11 MED ORDER — FLUTICASONE-UMECLIDIN-VILANT 100-62.5-25 MCG/INH IN AEPB: 1.0000 | INHALATION_SPRAY | Freq: Every day | RESPIRATORY_TRACT | Status: DC

## 2019-08-11 MED ORDER — UMECLIDINIUM BROMIDE 62.5 MCG/INH IN AEPB
1.0000 | INHALATION_SPRAY | Freq: Every day | RESPIRATORY_TRACT | Status: DC
  Filled 2019-08-11: qty 7

## 2019-08-11 MED ORDER — ISOSORBIDE MONONITRATE ER 30 MG PO TB24
30.0000 mg | ORAL_TABLET | Freq: Two times a day (BID) | ORAL | Status: DC
  Administered 2019-08-11 – 2019-08-14 (×7): 30 mg via ORAL
  Filled 2019-08-11 (×7): qty 1

## 2019-08-11 MED ORDER — ONDANSETRON HCL 4 MG/2ML IJ SOLN
INTRAMUSCULAR | Status: AC
  Filled 2019-08-11: qty 2

## 2019-08-11 MED ORDER — MAGNESIUM HYDROXIDE 400 MG/5ML PO SUSP
30.0000 mL | Freq: Every day | ORAL | Status: DC | PRN
  Administered 2019-08-14: 30 mL via ORAL
  Filled 2019-08-11: qty 30

## 2019-08-11 MED ORDER — GABAPENTIN 600 MG PO TABS
1200.0000 mg | ORAL_TABLET | Freq: Every morning | ORAL | Status: DC
  Administered 2019-08-11 – 2019-08-14 (×4): 1200 mg via ORAL
  Filled 2019-08-11 (×4): qty 2

## 2019-08-11 MED ORDER — CARVEDILOL 3.125 MG PO TABS
3.1250 mg | ORAL_TABLET | Freq: Two times a day (BID) | ORAL | Status: DC
  Administered 2019-08-11 – 2019-08-14 (×6): 3.125 mg via ORAL
  Filled 2019-08-11 (×8): qty 1

## 2019-08-11 MED ORDER — METOCLOPRAMIDE HCL 10 MG PO TABS
5.0000 mg | ORAL_TABLET | Freq: Three times a day (TID) | ORAL | Status: DC | PRN
  Administered 2019-08-14: 10 mg via ORAL
  Filled 2019-08-11: qty 1

## 2019-08-11 MED ORDER — FENTANYL CITRATE (PF) 100 MCG/2ML IJ SOLN
INTRAMUSCULAR | Status: DC | PRN
  Administered 2019-08-11: 50 ug via INTRAVENOUS

## 2019-08-11 MED ORDER — LACTATED RINGERS IV SOLN
INTRAVENOUS | Status: DC | PRN
  Administered 2019-08-11: 08:00:00 via INTRAVENOUS

## 2019-08-11 MED ORDER — METOCLOPRAMIDE HCL 5 MG/ML IJ SOLN: 5.0000 mg | Freq: Three times a day (TID) | INTRAMUSCULAR | Status: DC | PRN

## 2019-08-11 MED ORDER — FLUTICASONE PROPIONATE 50 MCG/ACT NA SUSP
2.0000 | Freq: Every day | NASAL | Status: DC
  Administered 2019-08-12 – 2019-08-14 (×2): 2 via NASAL
  Filled 2019-08-11: qty 16

## 2019-08-11 MED ORDER — GABAPENTIN 600 MG PO TABS: 1200.0000 mg | ORAL_TABLET | ORAL | Status: DC

## 2019-08-11 MED ORDER — ROCURONIUM BROMIDE 100 MG/10ML IV SOLN
INTRAVENOUS | Status: DC | PRN
  Administered 2019-08-11: 40 mg via INTRAVENOUS
  Administered 2019-08-11 (×2): 10 mg via INTRAVENOUS

## 2019-08-11 MED ORDER — KETAMINE HCL 50 MG/ML IJ SOLN
INTRAMUSCULAR | Status: AC
  Filled 2019-08-11: qty 10

## 2019-08-11 MED ORDER — ONDANSETRON HCL 4 MG/2ML IJ SOLN: 4.0000 mg | Freq: Once | INTRAMUSCULAR | Status: DC | PRN

## 2019-08-11 MED ORDER — CLOPIDOGREL BISULFATE 75 MG PO TABS
75.0000 mg | ORAL_TABLET | Freq: Every day | ORAL | Status: DC
  Administered 2019-08-11 – 2019-08-14 (×4): 75 mg via ORAL
  Filled 2019-08-11 (×4): qty 1

## 2019-08-11 MED ORDER — LIDOCAINE HCL (PF) 2 % IJ SOLN
INTRAMUSCULAR | Status: AC
  Filled 2019-08-11: qty 10

## 2019-08-11 MED ORDER — NITROGLYCERIN 0.4 MG SL SUBL: 0.4000 mg | SUBLINGUAL_TABLET | SUBLINGUAL | Status: DC | PRN

## 2019-08-11 MED ORDER — ALUM & MAG HYDROXIDE-SIMETH 200-200-20 MG/5ML PO SUSP: 30.0000 mL | ORAL | Status: DC | PRN

## 2019-08-11 MED ORDER — KETAMINE HCL 50 MG/ML IJ SOLN
INTRAMUSCULAR | Status: DC | PRN
  Administered 2019-08-11 (×4): 12.5 mg via INTRAMUSCULAR

## 2019-08-11 MED ORDER — SUCCINYLCHOLINE CHLORIDE 20 MG/ML IJ SOLN
INTRAMUSCULAR | Status: AC
  Filled 2019-08-11: qty 1

## 2019-08-11 MED ORDER — CEFAZOLIN SODIUM-DEXTROSE 2-4 GM/100ML-% IV SOLN
2.0000 g | Freq: Four times a day (QID) | INTRAVENOUS | Status: AC
  Administered 2019-08-11 – 2019-08-12 (×3): 2 g via INTRAVENOUS
  Filled 2019-08-11 (×3): qty 100

## 2019-08-11 MED ORDER — ONDANSETRON HCL 4 MG/2ML IJ SOLN
INTRAMUSCULAR | Status: DC | PRN
  Administered 2019-08-11: 4 mg via INTRAVENOUS

## 2019-08-11 MED ORDER — PROPOFOL 10 MG/ML IV BOLUS
INTRAVENOUS | Status: AC
  Filled 2019-08-11: qty 20

## 2019-08-11 MED ORDER — PHENOL 1.4 % MT LIQD
1.0000 | OROMUCOSAL | Status: DC | PRN
  Filled 2019-08-11: qty 177

## 2019-08-11 MED ORDER — EPHEDRINE SULFATE 50 MG/ML IJ SOLN
INTRAMUSCULAR | Status: AC
  Filled 2019-08-11: qty 1

## 2019-08-11 MED ORDER — FLUTICASONE FUROATE-VILANTEROL 100-25 MCG/INH IN AEPB
1.0000 | INHALATION_SPRAY | Freq: Every day | RESPIRATORY_TRACT | Status: DC
  Filled 2019-08-11: qty 28

## 2019-08-11 MED ORDER — SODIUM CHLORIDE 0.9 % IV SOLN: INTRAVENOUS | Status: DC

## 2019-08-11 SURGICAL SUPPLY — 43 items
BIT DRILL 4.3MMS DISTAL GRDTED (BIT) ×1 IMPLANT
BIT DRILL CANN LG 4.3MM (BIT) ×1 IMPLANT
CANISTER SUCT 1200ML W/VALVE (MISCELLANEOUS) ×3 IMPLANT
CANISTER WOUND CARE 500ML ATS (WOUND CARE) ×3 IMPLANT
CHLORAPREP W/TINT 26 (MISCELLANEOUS) ×3 IMPLANT
CORTICAL BONE SCR 5.0MM X 48MM (Screw) ×3 IMPLANT
COVER WAND RF STERILE (DRAPES) ×3 IMPLANT
DRAPE 3/4 80X56 (DRAPES) ×3 IMPLANT
DRAPE U-SHAPE 47X51 STRL (DRAPES) ×3 IMPLANT
DRILL 4.3MMS DISTAL GRADUATED (BIT) ×3
DRILL BIT CANN LG 4.3MM (BIT) ×3
DRSG OPSITE POSTOP 3X4 (GAUZE/BANDAGES/DRESSINGS) ×6 IMPLANT
ELECT REM PT RETURN 9FT ADLT (ELECTROSURGICAL) ×3
ELECTRODE REM PT RTRN 9FT ADLT (ELECTROSURGICAL) ×1 IMPLANT
GLOVE BIO SURGEON STRL SZ7 (GLOVE) ×9 IMPLANT
GLOVE BIOGEL PI IND STRL 9 (GLOVE) ×1 IMPLANT
GLOVE BIOGEL PI INDICATOR 9 (GLOVE) ×2
GLOVE SURG SYN 9.0  PF PI (GLOVE) ×2
GLOVE SURG SYN 9.0 PF PI (GLOVE) ×1 IMPLANT
GOWN SRG 2XL LVL 4 RGLN SLV (GOWNS) ×1 IMPLANT
GOWN STRL NON-REIN 2XL LVL4 (GOWNS) ×2
GOWN STRL REUS W/ TWL LRG LVL3 (GOWN DISPOSABLE) ×1 IMPLANT
GOWN STRL REUS W/TWL LRG LVL3 (GOWN DISPOSABLE) ×2
GUIDEPIN VERSANAIL DSP 3.2X444 (ORTHOPEDIC DISPOSABLE SUPPLIES) ×3 IMPLANT
GUIDEWIRE BALL NOSE 100CM (WIRE) ×6 IMPLANT
HFN LAG SCREW 10.5MM X 115MM (Orthopedic Implant) ×3 IMPLANT
HFN LH 130 DEG 9MM X 360MM (Nail) ×3 IMPLANT
KIT PREVENA INCISION MGT 13 (CANNISTER) ×3 IMPLANT
KIT TURNOVER KIT A (KITS) ×3 IMPLANT
MAT ABSORB  FLUID 56X50 GRAY (MISCELLANEOUS) ×2
MAT ABSORB FLUID 56X50 GRAY (MISCELLANEOUS) ×1 IMPLANT
NEEDLE FILTER BLUNT 18X 1/2SAF (NEEDLE) ×2
NEEDLE FILTER BLUNT 18X1 1/2 (NEEDLE) ×1 IMPLANT
NS IRRIG 500ML POUR BTL (IV SOLUTION) ×3 IMPLANT
PACK HIP COMPR (MISCELLANEOUS) ×3 IMPLANT
SCALPEL PROTECTED #15 DISP (BLADE) ×6 IMPLANT
SCREW BONE CORTICAL 5.0X40 (Screw) ×3 IMPLANT
SCREW BONE CORTICAL 5.0X42 (Screw) ×3 IMPLANT
SCREW CORTICL BON 5.0MM X 48MM (Screw) ×1 IMPLANT
STAPLER SKIN PROX 35W (STAPLE) ×3 IMPLANT
SUT VIC AB 1 CT1 36 (SUTURE) ×3 IMPLANT
SUT VIC AB 2-0 CT1 (SUTURE) ×3 IMPLANT
SYR 10ML LL (SYRINGE) ×3 IMPLANT

## 2019-08-11 NOTE — Anesthesia Post-op Follow-up Note (Signed)
Anesthesia QCDR form completed.        

## 2019-08-11 NOTE — Anesthesia Preprocedure Evaluation (Addendum)
Anesthesia Evaluation  Patient identified by MRN, date of birth, ID band Patient awake    Reviewed: Allergy & Precautions, NPO status , Patient's Chart, lab work & pertinent test results, reviewed documented beta blocker date and time   Airway Mallampati: III  TM Distance: >3 FB     Dental  (+) Chipped   Pulmonary COPD, former smoker,           Cardiovascular +CHF       Neuro/Psych    GI/Hepatic   Endo/Other  diabetes, Type 2Morbid obesity  Renal/GU      Musculoskeletal  (+) Arthritis ,   Abdominal   Peds  Hematology   Anesthesia Other Findings On plavix. Will plan on GOT. Has 5 cardiac stents. Decreased GFR, Hb 9.9. EKG checked and ok. On O2 at nite.  Reproductive/Obstetrics                            Anesthesia Physical Anesthesia Plan  ASA: III  Anesthesia Plan: General   Post-op Pain Management:    Induction: Intravenous  PONV Risk Score and Plan:   Airway Management Planned: Oral ETT  Additional Equipment:   Intra-op Plan:   Post-operative Plan:   Informed Consent: I have reviewed the patients History and Physical, chart, labs and discussed the procedure including the risks, benefits and alternatives for the proposed anesthesia with the patient or authorized representative who has indicated his/her understanding and acceptance.       Plan Discussed with: CRNA  Anesthesia Plan Comments:         Anesthesia Quick Evaluation

## 2019-08-11 NOTE — Transfer of Care (Signed)
Immediate Anesthesia Transfer of Care Note  Patient: Destiny Zuniga  Procedure(s) Performed: INTRAMEDULLARY (IM) NAIL INTERTROCHANTRIC (Left )  Patient Location: PACU  Anesthesia Type:General  Level of Consciousness: drowsy and patient cooperative  Airway & Oxygen Therapy: Patient Spontanous Breathing and Patient connected to nasal cannula oxygen  Post-op Assessment: Report given to RN and Post -op Vital signs reviewed and stable  Post vital signs: Reviewed and stable  Last Vitals:  Vitals Value Taken Time  BP 146/67 08/11/19 1001  Temp 36.8 C 08/11/19 1000  Pulse 72 08/11/19 1003  Resp 14 08/11/19 1003  SpO2 94 % 08/11/19 1003  Vitals shown include unvalidated device data.  Last Pain:  Vitals:   08/11/19 1000  TempSrc:   PainSc: Asleep         Complications: No apparent anesthesia complications

## 2019-08-11 NOTE — Anesthesia Procedure Notes (Signed)
Procedure Name: Intubation Date/Time: 08/11/2019 8:21 AM Performed by: Sherrine Maples, CRNA Pre-anesthesia Checklist: Patient identified, Patient being monitored, Timeout performed, Emergency Drugs available and Suction available Patient Re-evaluated:Patient Re-evaluated prior to induction Oxygen Delivery Method: Circle system utilized Preoxygenation: Pre-oxygenation with 100% oxygen Induction Type: IV induction Laryngoscope Size: Miller and 2 Grade View: Grade II Tube type: Oral Tube size: 7.0 mm Number of attempts: 1 Airway Equipment and Method: Stylet Placement Confirmation: ETT inserted through vocal cords under direct vision,  positive ETCO2 and breath sounds checked- equal and bilateral Secured at: 21 cm Tube secured with: Tape Dental Injury: Teeth and Oropharynx as per pre-operative assessment

## 2019-08-11 NOTE — Progress Notes (Signed)
D: Pt alert and oriented. Pt experiences pain at surgical site, medication given as prescribed. Pain has been managed well. Skin assessed upon arrival to the unit from surgery with Ethelene Hal RN. Pt has honeycomb dressing and hemovac w/ no drainage noted. Pt has scattered brusing.   Pt is anxious about getting up w/PT, having to move (rolling for linen change), and with IV use.  A: Scheduled medications administered to pt, per MD orders. Support and encouragement provided. Frequent verbal contact made.   R: No adverse drug reactions noted. Pt complaint with medications and treatment plan. Pt interacts well with staff on the unit. Pt is stable, will continue to monitor and provide care as ordered.

## 2019-08-11 NOTE — Op Note (Signed)
08/10/2019 - 08/11/2019  9:52 AM  PATIENT:  Destiny Zuniga  78 y.o. female  PRE-OPERATIVE DIAGNOSIS:  Left hip Fracture comminuted subtrochanteric  POST-OPERATIVE DIAGNOSIS:  *Comminuted subtrochanteric left hip fracture  PROCEDURE:  Procedure(s): INTRAMEDULLARY (IM) NAIL INTERTROCHANTRIC (Left)  SURGEON: Laurene Footman, MD  ASSISTANTS: None  ANESTHESIA:   general  EBL:  Total I/O In: -  Out: 300 [Blood:300]  BLOOD ADMINISTERED:none  DRAINS: none   LOCAL MEDICATIONS USED:  NONE  SPECIMEN:  No Specimen  DISPOSITION OF SPECIMEN:  N/A  COUNTS:  YES  TOURNIQUET:  * No tourniquets in log *  IMPLANTS: Biomet affixes 9 x 360 left rod with 115 mm leg screw and 40 and 42 mm 5.0 distal interlocking screws  DICTATION: .Dragon Dictation patient was brought to the operating room and after adequate anesthesia was obtained patient was transferred to the fracture table.  After positioning the right leg in the well-leg holder and applying traction to the left foot acceptable alignment was obtained by closed means.  The hip was then prepped and draped using a barrier drape method.  After appropriate patient identification and timeout procedures were completed, incision was made over the proximal thigh with spreading the soft tissue to get down to the greater trochanter.  A guidewire was inserted into the tip of the trochanter and proximal reaming carried out followed by placement of the long guidewire.  Measurement made off of this and the 11 mm had a great deal chatter and so the 9 mm rod was chosen.  This was inserted down to the appropriate depth and near center center position for the guidewire into the femoral head.  This was done through the lateral sleeve attachment.  Measurements were made off of this followed by triple reaming then placed in the 115 mm leg screw.  The proximal locking mechanism was tightened and a quarter turn loosened to allow for compression if needed.  The  insertion handle was removed with permanent C-arm views obtained.  This point distal interlocking screws were placed through the distal portion of the rod with standard technique of drilling measuring and placing the 5.0 millimeter screws.  Permanent C-arm views of this was were obtained as well.  The wounds were thoroughly irrigated and the deep fascia proximally was closed with #1 Vicryl followed by 2-0 Vicryl subcutaneously and skin staples the remaining wounds closed with skin staples.  Xeroform and honeycomb dressings applied with a Praveena applied to the proximal wound  PLAN OF CARE: Continue as inpatient  PATIENT DISPOSITION:  PACU - hemodynamically stable.

## 2019-08-11 NOTE — Anesthesia Postprocedure Evaluation (Signed)
Anesthesia Post Note  Patient: Destiny Zuniga  Procedure(s) Performed: INTRAMEDULLARY (IM) NAIL INTERTROCHANTRIC with wound vac application (Left )  Patient location during evaluation: PACU Anesthesia Type: General Level of consciousness: awake and alert Pain management: pain level controlled Vital Signs Assessment: post-procedure vital signs reviewed and stable Respiratory status: spontaneous breathing, nonlabored ventilation, respiratory function stable and patient connected to nasal cannula oxygen Cardiovascular status: blood pressure returned to baseline and stable Postop Assessment: no apparent nausea or vomiting Anesthetic complications: no     Last Vitals:  Vitals:   08/11/19 1110 08/11/19 1200  BP: (!) 151/62 (!) 142/63  Pulse: 70 71  Resp: 18 13  Temp: 36.5 C 36.4 C  SpO2: 92% 91%    Last Pain:  Vitals:   08/11/19 1200  TempSrc: Oral  PainSc:                  Fuad Forget S

## 2019-08-11 NOTE — Progress Notes (Addendum)
[employer] - [place] [facility]   PATIENT NAME: Destiny Zuniga    MR#:  [id number]  DATE OF BIRTH:  18-Mar-1941  SUBJECTIVE:  CHIEF COMPLAINT:   Chief Complaint  Patient presents with  . Fall   Pain 8/10 at surgical site Chronic shortness of breath is the same Daughter at bedside  Had itching with oxycodone.  No rash  REVIEW OF SYSTEMS:    Review of Systems  Constitutional: Negative for chills and fever.  HENT: Negative for sore throat.   Eyes: Negative for blurred vision, double vision and pain.  Respiratory: Positive for shortness of breath (chronic). Negative for cough, hemoptysis and wheezing.   Cardiovascular: Negative for chest pain, palpitations, orthopnea and leg swelling.  Gastrointestinal: Negative for abdominal pain, constipation, diarrhea, heartburn, nausea and vomiting.  Genitourinary: Negative for dysuria and hematuria.  Musculoskeletal: Positive for joint pain. Negative for back pain.  Skin: Positive for itching. Negative for rash.  Neurological: Negative for sensory change, speech change, focal weakness and headaches.  Endo/Heme/Allergies: Does not bruise/bleed easily.  Psychiatric/Behavioral: Negative for depression. The patient is not nervous/anxious.     DRUG ALLERGIES:   Allergies  Allergen Reactions  . Codeine Anaphylaxis  . Midazolam Other (See Comments)    Altered mental status    . Celecoxib Other (See Comments)    Excessive bleeding   . Lisinopril Cough    VITALS:  Blood pressure (!) 142/63, pulse 71, temperature 97.6 F (36.4 C), temperature source Oral, resp. rate 13, height 5\' 2"  (1.575 m), weight 98 kg, SpO2 91 %.  PHYSICAL EXAMINATION:   Physical Exam  GENERAL:  78 y.o.-year-old patient lying in the bed with no acute distress.  Obese EYES: Pupils equal, round, reactive to light and accommodation. No scleral icterus. Extraocular muscles intact.  HEENT: Head atraumatic, normocephalic. Oropharynx and  nasopharynx clear.  NECK:  Supple, no jugular venous distention. No thyroid enlargement, no tenderness.  LUNGS: Normal breath sounds bilaterally, no wheezing, rales, rhonchi. No use of accessory muscles of respiration.  CARDIOVASCULAR: S1, S2 normal. No murmurs, rubs, or gallops.  ABDOMEN: Soft, nontender, nondistended. Bowel sounds present. No organomegaly or mass.  EXTREMITIES: No cyanosis, clubbing or edema b/l.    NEUROLOGIC: Cranial nerves II through XII are intact. No focal Motor or sensory deficits b/l.   PSYCHIATRIC: The patient is alert and oriented x 3.  SKIN: Dressing over surgical site  LABORATORY PANEL:   CBC Recent Labs  Lab 08/11/19 0446  WBC 12.6*  HGB 9.9*  HCT 33.0*  PLT 215   ------------------------------------------------------------------------------------------------------------------ Chemistries  Recent Labs  Lab 08/11/19 0446  NA 138  K 4.7  CL 100  CO2 26  GLUCOSE 233*  BUN 20  CREATININE 1.24*  CALCIUM 8.9   ------------------------------------------------------------------------------------------------------------------  Cardiac Enzymes No results for input(s): TROPONINI in the last 168 hours. ------------------------------------------------------------------------------------------------------------------  RADIOLOGY:  Ct Head Wo Contrast  Result Date: 08/10/2019 CLINICAL DATA:  78 year old female with acute headache following fall and head injury. EXAM: CT HEAD WITHOUT CONTRAST TECHNIQUE: Contiguous axial images were obtained from the base of the skull through the vertex without intravenous contrast. COMPARISON:  None. FINDINGS: Brain: No evidence of acute infarction, hemorrhage, hydrocephalus, extra-axial collection or mass lesion/mass effect. Mild chronic small-vessel white matter ischemic changes are noted. Vascular: Carotid atherosclerotic calcifications noted. Skull: Normal. Negative for fracture or focal lesion. Sinuses/Orbits: No acute  finding. Other: None. IMPRESSION: 1. No evidence of acute intracranial abnormality. 2. Mild chronic small-vessel white matter ischemic  changes. Electronically Signed   By: Margarette Canada M.D.   On: 08/10/2019 14:49   Dg Hip Unilat With Pelvis 2-3 Views Left  Result Date: 08/10/2019 CLINICAL DATA:  Left hip pain you a fall while trying to open a door today. Initial encounter. EXAM: DG HIP (WITH OR WITHOUT PELVIS) 2-3V LEFT COMPARISON:  None. FINDINGS: The patient has an intertrochanteric fracture on the left with subtrochanteric extension. The left femoral head is located. No other acute abnormality seen. IMPRESSION: Acute left intertrochanteric fracture with subtrochanteric extension. Electronically Signed   By: Inge Rise M.D.   On: 08/10/2019 12:28     ASSESSMENT AND PLAN:   * Acute left intertrochanteric fracture with subtrochanteric extension. S/p repair POD - 0 Pain medications as needed Physical therapy to see SNF at discharge SCDs for DVT prophylaxis at this time.  Lovenox or heparin per orthopedic team.  *Syncope.  Patient has chronic lightheadedness and presyncope.  she fell and lost consciousness.    CT head with nothing acute.  Will need outpatient follow-up with cardiology to finish work-up.  *Diabetes mellitus.  Sliding scale insulin added.  Diabetic diet.  We will continue Lantus from home.  *Hypertension.  Continue home medications  *Chronic diastolic CHF.  No signs of fluid overload.   Stop IV fluids  *COPD with chronic respiratory failure.  Continue oxygen, nebulizers and inhalers  All the records are reviewed and case discussed with Care Management/Social Worker Management plans discussed with the patient, family and they are in agreement.  CODE STATUS: FULL CODE  DVT Prophylaxis: SCDs  TOTAL TIME TAKING CARE OF THIS PATIENT: 35 minutes.   POSSIBLE D/C IN 1-2 DAYS, DEPENDING ON CLINICAL CONDITION.  Leia Alf Jasmene Goswami M.D on 08/11/2019 at 12:29  PM  Between 7am to 6pm - Pager - [phone]  After 6pm go to [email or web address] - password [email or web address] [place]  Office  [phone]  CC: Primary care physician; System, Pcp Not In  Note: This dictation was prepared with Dragon dictation along with smaller phrase technology. Any transcriptional errors that result from this process are unintentional.

## 2019-08-12 LAB — GLUCOSE, CAPILLARY
Glucose-Capillary: 315 mg/dL — ABNORMAL HIGH (ref 70–99)
Glucose-Capillary: 290 mg/dL — ABNORMAL HIGH (ref 70–99)
Glucose-Capillary: 319 mg/dL — ABNORMAL HIGH (ref 70–99)
Glucose-Capillary: 288 mg/dL — ABNORMAL HIGH (ref 70–99)

## 2019-08-12 LAB — BASIC METABOLIC PANEL
Anion gap: 11 (ref 5–15)
BUN: 25 mg/dL — ABNORMAL HIGH (ref 8–23)
CO2: 27 mmol/L (ref 22–32)
Calcium: 8.7 mg/dL — ABNORMAL LOW (ref 8.9–10.3)
Chloride: 97 mmol/L — ABNORMAL LOW (ref 98–111)
Creatinine, Ser: 1.27 mg/dL — ABNORMAL HIGH (ref 0.44–1.00)
GFR calc Af Amer: 46 mL/min — ABNORMAL LOW (ref >60)
GFR calc non Af Amer: 40 mL/min — ABNORMAL LOW (ref >60)
Glucose, Bld: 368 mg/dL — ABNORMAL HIGH (ref 70–99)
Potassium: 5 mmol/L (ref 3.5–5.1)
Sodium: 135 mmol/L (ref 135–145)

## 2019-08-12 LAB — CBC
HCT: 28.4 % — ABNORMAL LOW (ref 36.0–46.0)
Hemoglobin: 8.6 g/dL — ABNORMAL LOW (ref 12.0–15.0)
MCH: 25.1 pg — ABNORMAL LOW (ref 26.0–34.0)
MCHC: 30.3 g/dL (ref 30.0–36.0)
MCV: 82.8 fL (ref 80.0–100.0)
Platelets: 185 10*3/uL (ref 150–400)
RBC: 3.43 MIL/uL — ABNORMAL LOW (ref 3.87–5.11)
RDW: 16.6 % — ABNORMAL HIGH (ref 11.5–15.5)
WBC: 16.1 10*3/uL — ABNORMAL HIGH (ref 4.0–10.5)
nRBC: 0 % (ref 0.0–0.2)

## 2019-08-12 MED ORDER — INSULIN ASPART 100 UNIT/ML ~~LOC~~ SOLN
5.0000 [IU] | Freq: Three times a day (TID) | SUBCUTANEOUS | Status: DC
  Administered 2019-08-12 – 2019-08-14 (×8): 5 [IU] via SUBCUTANEOUS
  Filled 2019-08-12 (×7): qty 1

## 2019-08-12 MED ORDER — INSULIN GLARGINE 100 UNIT/ML ~~LOC~~ SOLN
25.0000 [IU] | Freq: Every day | SUBCUTANEOUS | Status: DC
  Administered 2019-08-12 – 2019-08-13 (×2): 25 [IU] via SUBCUTANEOUS
  Filled 2019-08-12 (×3): qty 0.25

## 2019-08-12 NOTE — Progress Notes (Signed)
  Subjective: 1 Day Post-Op Procedure(s) (LRB): INTRAMEDULLARY (IM) NAIL INTERTROCHANTRIC with wound vac application (Left) Patient reports pain as 7 on 0-10 scale.   Patient is well, and has had no acute complaints or problems PT and care management to assist with discharge planning. Negative for chest pain and shortness of breath Fever: no Gastrointestinal:Negative for nausea and vomiting  Objective: Vital signs in last 24 hours: Temp:  [97.4 F (36.3 C)-98.3 F (36.8 C)] 98.1 F (36.7 C) (10/25 0736) Pulse Rate:  [59-79] 59 (10/25 0736) Resp:  [13-28] 18 (10/25 0309) BP: (134-156)/(58-71) 144/64 (10/25 0736) SpO2:  [90 %-99 %] 99 % (10/25 0736)  Intake/Output from previous day:  Intake/Output Summary (Last 24 hours) at 08/12/2019 0745 Last data filed at 08/12/2019 0311 Gross per 24 hour  Intake 1150 ml  Output 1200 ml  Net -50 ml    Intake/Output this shift: No intake/output data recorded.  Labs: Recent Labs    08/10/19 1140 08/11/19 0446 08/12/19 0635  HGB 10.2* 9.9* 8.6*   Recent Labs    08/11/19 0446 08/12/19 0635  WBC 12.6* 16.1*  RBC 4.00 3.43*  HCT 33.0* 28.4*  PLT 215 185   Recent Labs    08/11/19 0446 08/12/19 0635  NA 138 135  K 4.7 5.0  CL 100 97*  CO2 26 27  BUN 20 25*  CREATININE 1.24* 1.27*  GLUCOSE 233* 368*  CALCIUM 8.9 8.7*   Recent Labs    08/10/19 1140  INR 1.1     EXAM General - Patient is Alert, Appropriate and Oriented Extremity - ABD soft Sensation intact distally Intact pulses distally Dorsiflexion/Plantar flexion intact No cellulitis present Compartment soft Dressing/Incision - Two more distal incisions without any drainage.  Woundvac intact to the most proximal incision without any significant drainage. Motor Function - intact, moving foot and toes well on exam.   Past Medical History:  Diagnosis Date  . Arthritis   . CHF (congestive heart failure) ([place])   . COPD (chronic obstructive pulmonary disease)  ([place])   . Diabetes mellitus without complication ([place])   . Pulmonary hypertension ([place])   . Tricuspid regurgitation     Assessment/Plan: 1 Day Post-Op Procedure(s) (LRB): INTRAMEDULLARY (IM) NAIL INTERTROCHANTRIC with wound vac application (Left) Active Problems:   Hip fracture ([facility])  Estimated body mass index is 39.51 kg/m as calculated from the following:   Height as of this encounter: 5\' 2"  (1.575 m).   Weight as of this encounter: 98 kg. Advance diet Up with therapy D/C IV fluids when tolerating po intake.  Labs reviewed this AM, WBC 16.1.  No SOB, chest pain or urinary symptoms. Encouraged incentive spirometer. Hg 8.6 this AM, continue to monitor. CBC ordered for tomorrow morning. Begin working on [employer] Up with therapy today.  DVT Prophylaxis - TED hose and Plavix 50% weightbearing to the left leg with therapy.  Raquel Drevion Offord, PA-C [facility] 08/12/2019, 7:45 AM

## 2019-08-12 NOTE — Progress Notes (Signed)
D: Pt alert and oriented. Pt denies experiencing any pain at this time. PT worked with pt and got them to the chair. Pt did struggle w/transfering. PT assisted w/getting pt back to bed after lunch. Pt has c/o stiffness in surgical leg. Pt's anxiety increased while working with PT and at the thought of standing/transfering to chair. Once back in to bed and settled pt's anxiety has deescalated.   A: Scheduled medications administered to pt, per MD orders. Support and encouragement provided. Frequent verbal contact made.   R: No adverse drug reactions noted. Pt complaint with medications and treatment plan. Pt interacts well with staff on the unit. Pt remains stable at this time, will continue to monitor and provide care as ordered.

## 2019-08-12 NOTE — Progress Notes (Signed)
PT Cancellation Note  Patient Details Name: Laneisha Mino MRN: [id number] DOB: October 02, 1941   Cancelled Treatment:    Reason Eval/Treat Not Completed: Patient at procedure or test/unavailable.  Order received and chart reviewed.  Nursing currently in room with pt.  Will return shortly.  Roxanne Gates, PT, DPT  Roxanne Gates 08/12/2019, 10:12 AM

## 2019-08-12 NOTE — Evaluation (Signed)
Physical Therapy Evaluation Patient Details Name: Destiny Zuniga MRN: [id number] DOB: [birth year] Today's Date: 08/12/2019   History of Present Illness  Pt admitted s/p L I.M. nailing following a community fall.  PMH includes COPD, pulm Htn, CHF, arthritis.  Clinical Impression  Pt is a 78 year old female who lives in a one story home with her significant other.  She is a limited community ambulator at baseline with need for AD.  Pt in bed and appearing anxious when PT arrived.  Additional time taken to reassure, educate and direct through deep breathing exercises throughout session.  Mod-Max A +2 needed to get to bedside and perform standing pivot to chair.  Pt's vitals WNL in sitting without pt becoming symptomatic.  She was able to manage a standing pivot transfer with Max A +2 to rise from bed, manage RW and VC's for hand placement, upright posture and sequencing steps to manage PWB status.  Pt better able to manage transfer once cues to stand upright.  She was able to follow directions for all there ex, manual assist needed for all but ankle pumps, and presented with fair overall strength.  Pt will continue to benefit from skilled PT with focus on strength, tolerance to activity, pain management and safe functional mobility.  Pt appropriate for SNF placement at discharge due to decline in mobility.    Follow Up Recommendations SNF    Equipment Recommendations  None recommended by PT    Recommendations for Other Services       Precautions / Restrictions Precautions Precautions: Fall Restrictions Weight Bearing Restrictions: Yes LLE Weight Bearing: Partial weight bearing LLE Partial Weight Bearing Percentage or Pounds: 50%      Mobility  Bed Mobility Overal bed mobility: Needs Assistance Bed Mobility: Supine to Sit     Supine to sit: Max assist;+2 for physical assistance     General bed mobility comments: Assistance to bring R LE over EOB and to elevate trunk.  Heavy  VC's for body mechanics and breathing exercises to manage pain and anxiety.  Transfers Overall transfer level: Needs assistance Equipment used: Rolling walker (2 wheeled) Transfers: Stand Pivot Transfers   Stand pivot transfers: Max assist;+2 physical assistance       General transfer comment: Assistance to rise from bedside, manage RW and coordinating gait pattern to manage WB status.  Pt needs reminders about hand placement during STS.  Ambulation/Gait                Stairs            Wheelchair Mobility    Modified Rankin (Stroke Patients Only)       Balance                                             Pertinent Vitals/Pain Pain Assessment: Faces Faces Pain Scale: Hurts whole lot Pain Location: R hip after transfer. Pain Descriptors / Indicators: Aching;Grimacing;Guarding Pain Intervention(s): Monitored during session;Premedicated before session;Limited activity within patient's tolerance    Home Living Family/patient expects to be discharged to:: Skilled nursing facility Living Arrangements: Spouse/significant other Available Help at Discharge: Family;Available PRN/intermittently Type of Home: House Home Access: Stairs to enter   Entrance Stairs-Number of Steps: 4 Home Layout: One level        Prior Function Level of Independence: Independent         Comments:  Occasional use of a SPC.     Hand Dominance        Extremity/Trunk Assessment   Upper Extremity Assessment Upper Extremity Assessment: Overall WFL for tasks assessed    Lower Extremity Assessment Lower Extremity Assessment: Overall WFL for tasks assessed(L LE: ankle DF/PF, knee flex/ext:  4/5; sensation fully returned.)    Cervical / Trunk Assessment Cervical / Trunk Assessment: Kyphotic  Communication   Communication: No difficulties  Cognition Arousal/Alertness: Awake/alert Behavior During Therapy: WFL for tasks assessed/performed;Anxious Overall  Cognitive Status: Within Functional Limits for tasks assessed                                 General Comments: Pt apologizes throughout, stating that we need "masking tape" in case she calls out in pain.      General Comments      Exercises General Exercises - Lower Extremity Ankle Circles/Pumps: 10 reps;Supine;AROM;Both Quad Sets: Right;10 reps;Supine Hip ABduction/ADduction: AAROM;Left;5 reps;Supine Straight Leg Raises: AAROM;Left;5 reps;Supine Other Exercises Other Exercises: Increased time to perform all tasks with rest breaks and deep breathing to manage pain and anxiety.  x4 min   Assessment/Plan    PT Assessment Patient needs continued PT services  PT Problem List Decreased strength;Decreased mobility;Decreased activity tolerance;Decreased balance;Decreased knowledge of use of DME;Pain;Decreased range of motion;Decreased coordination       PT Treatment Interventions DME instruction;Therapeutic activities;Gait training;Therapeutic exercise;Patient/family education;Stair training;Balance training;Functional mobility training    PT Goals (Current goals can be found in the Care Plan section)  Acute Rehab PT Goals Patient Stated Goal: To return to being a community ambulator. PT Goal Formulation: With patient Time For Goal Achievement: 09/02/19 Potential to Achieve Goals: Good    Frequency BID   Barriers to discharge        Co-evaluation               AM-PAC PT "6 Clicks" Mobility  Outcome Measure Help needed turning from your back to your side while in a flat bed without using bedrails?: A Lot Help needed moving from lying on your back to sitting on the side of a flat bed without using bedrails?: A Lot Help needed moving to and from a bed to a chair (including a wheelchair)?: A Lot Help needed standing up from a chair using your arms (e.g., wheelchair or bedside chair)?: A Lot Help needed to walk in hospital room?: A Lot Help needed climbing  3-5 steps with a railing? : A Lot 6 Click Score: 12    End of Session Equipment Utilized During Treatment: Gait belt;Oxygen Activity Tolerance: Patient limited by pain Patient left: in chair;with chair alarm set;with call bell/phone within reach Nurse Communication: Mobility status PT Visit Diagnosis: Unsteadiness on feet (R26.81);Muscle weakness (generalized) (M62.81);Pain Pain - Right/Left: Left Pain - part of body: Hip    Time: 7793-9030 PT Time Calculation (min) (ACUTE ONLY): 34 min   Charges:   PT Evaluation $PT Eval Low Complexity: 1 Low PT Treatments $Therapeutic Activity: 8-22 mins        Glenetta Hew, PT, DPT   Glenetta Hew 08/12/2019, 2:10 PM

## 2019-08-12 NOTE — Progress Notes (Signed)
[facility] [place] [facility]   PATIENT NAME: Destiny Zuniga    MR#:  [id number]  DATE OF BIRTH:  13-May-1941  SUBJECTIVE:  CHIEF COMPLAINT:   Chief Complaint  Patient presents with  . Fall   Chronic shortness of breath is the same Pain present  REVIEW OF SYSTEMS:    Review of Systems  Constitutional: Negative for chills and fever.  HENT: Negative for sore throat.   Eyes: Negative for blurred vision, double vision and pain.  Respiratory: Positive for shortness of breath (chronic). Negative for cough, hemoptysis and wheezing.   Cardiovascular: Negative for chest pain, palpitations, orthopnea and leg swelling.  Gastrointestinal: Negative for abdominal pain, constipation, diarrhea, heartburn, nausea and vomiting.  Genitourinary: Negative for dysuria and hematuria.  Musculoskeletal: Positive for joint pain. Negative for back pain.  Skin: Positive for itching. Negative for rash.  Neurological: Negative for sensory change, speech change, focal weakness and headaches.  Endo/Heme/Allergies: Does not bruise/bleed easily.  Psychiatric/Behavioral: Negative for depression. The patient is not nervous/anxious.    DRUG ALLERGIES:   Allergies  Allergen Reactions  . Codeine Anaphylaxis  . Midazolam Other (See Comments)    Altered mental status    . Celecoxib Other (See Comments)    Excessive bleeding   . Lisinopril Cough    VITALS:  Blood pressure (!) 142/63, pulse 65, temperature 98.1 F (36.7 C), temperature source Oral, resp. rate 18, height 5\' 2"  (1.575 m), weight 98 kg, SpO2 100 %.  PHYSICAL EXAMINATION:   Physical Exam  GENERAL:  78 y.o.-year-old patient lying in the bed with no acute distress.  Obese EYES: Pupils equal, round, reactive to light and accommodation. No scleral icterus. Extraocular muscles intact.  HEENT: Head atraumatic, normocephalic. Oropharynx and nasopharynx clear.  NECK:  Supple, no jugular venous distention. No thyroid  enlargement, no tenderness.  LUNGS: Normal breath sounds bilaterally, no wheezing, rales, rhonchi. No use of accessory muscles of respiration.  CARDIOVASCULAR: S1, S2 normal. No murmurs, rubs, or gallops.  ABDOMEN: Soft, nontender, nondistended. Bowel sounds present. No organomegaly or mass.  EXTREMITIES: No cyanosis, clubbing or edema b/l.    NEUROLOGIC: Cranial nerves II through XII are intact. No focal Motor or sensory deficits b/l.   PSYCHIATRIC: The patient is alert and oriented x 3.  SKIN: Dressing over surgical site.  Wound VAC in place  LABORATORY PANEL:   CBC Recent Labs  Lab 08/12/19 0635  WBC 16.1*  HGB 8.6*  HCT 28.4*  PLT 185   ------------------------------------------------------------------------------------------------------------------ Chemistries  Recent Labs  Lab 08/12/19 0635  NA 135  K 5.0  CL 97*  CO2 27  GLUCOSE 368*  BUN 25*  CREATININE 1.27*  CALCIUM 8.7*   ------------------------------------------------------------------------------------------------------------------  Cardiac Enzymes No results for input(s): TROPONINI in the last 168 hours. ------------------------------------------------------------------------------------------------------------------  RADIOLOGY:  Ct Head Wo Contrast  Result Date: 08/10/2019 CLINICAL DATA:  78 year old female with acute headache following fall and head injury. EXAM: CT HEAD WITHOUT CONTRAST TECHNIQUE: Contiguous axial images were obtained from the base of the skull through the vertex without intravenous contrast. COMPARISON:  None. FINDINGS: Brain: No evidence of acute infarction, hemorrhage, hydrocephalus, extra-axial collection or mass lesion/mass effect. Mild chronic small-vessel white matter ischemic changes are noted. Vascular: Carotid atherosclerotic calcifications noted. Skull: Normal. Negative for fracture or focal lesion. Sinuses/Orbits: No acute finding. Other: None. IMPRESSION: 1. No evidence of  acute intracranial abnormality. 2. Mild chronic small-vessel white matter ischemic changes. Electronically Signed   By: 76.D.  On: 08/10/2019 14:49   Dg Hip Operative Unilat W Or W/o Pelvis Left  Result Date: 08/11/2019 CLINICAL DATA:  Intramedullary nailing for fracture EXAM: OPERATIVE LEFT HIP  2 VIEWS TECHNIQUE: Fluoroscopic spot image(s) were submitted for interpretation post-operatively. FLUOROSCOPY TIME:  1 minutes 53 seconds; 6 acquired images COMPARISON:  August 10, 2019 FINDINGS: Frontal and lateral views obtained. There is screw and rod fixation through a comminuted intertrochanteric femur fracture. Alignment at the fracture site appears near anatomic. Note that there remains medial avulsion of the lesser trochanter. Tip of the screw is in the proximal femoral head. No dislocation. IMPRESSION: Screw and rod fixation through comminuted intertrochanteric femur fracture on the left with alignment at the fracture site near anatomic. There is medial displacement of the lesser trochanter. No dislocation evident. Tip of screw and proximal left femoral head. Electronically Signed   By: Lowella Grip III M.D.   On: 08/11/2019 13:04   Dg Hip Unilat With Pelvis 2-3 Views Left  Result Date: 08/10/2019 CLINICAL DATA:  Left hip pain you a fall while trying to open a door today. Initial encounter. EXAM: DG HIP (WITH OR WITHOUT PELVIS) 2-3V LEFT COMPARISON:  None. FINDINGS: The patient has an intertrochanteric fracture on the left with subtrochanteric extension. The left femoral head is located. No other acute abnormality seen. IMPRESSION: Acute left intertrochanteric fracture with subtrochanteric extension. Electronically Signed   By: Inge Rise M.D.   On: 08/10/2019 12:28     ASSESSMENT AND PLAN:   * Acute left intertrochanteric fracture with subtrochanteric extension. S/p repair POD - 1 Pain medications as needed Physical therapy to see SNF at discharge SCDs for DVT  prophylaxis at this time.  Lovenox or heparin per orthopedic team.  *Syncope.  Patient has chronic lightheadedness and presyncope.  she fell and lost consciousness.    CT head with nothing acute.  Will need outpatient follow-up with cardiology to finish work-up.  *Diabetes mellitus.  Sliding scale insulin added.  Diabetic diet.  We will continue Lantus from home.  *Hypertension.  Continue home medications  *Chronic diastolic CHF.  No signs of fluid overload.   Continue home Lasix  *COPD with chronic respiratory failure.  Continue oxygen, nebulizers and inhalers  All the records are reviewed and case discussed with Care Management/Social Worker Management plans discussed with the patient, family and they are in agreement.  CODE STATUS: FULL CODE  DVT Prophylaxis: SCDs  TOTAL TIME TAKING CARE OF THIS PATIENT: 35 minutes.   POSSIBLE D/C IN 1-2 DAYS, DEPENDING ON CLINICAL CONDITION.  Leia Alf Gwendlyn Hanback M.D on 08/12/2019 at 10:58 AM  Between 7am to 6pm - Pager - [phone]  After 6pm go to [email or web address] - password [email or web address] [facility]  Office  [phone]  CC: Primary care physician; System, Pcp Not In  Note: This dictation was prepared with Dragon dictation along with smaller phrase technology. Any transcriptional errors that result from this process are unintentional.

## 2019-08-12 NOTE — Progress Notes (Signed)
Physical Therapy Treatment Patient Details Name: Destiny Zuniga MRN: [id number] DOB: 1941/02/29 Today's Date: 08/12/2019    History of Present Illness Pt admitted s/p L I.M. nailing following a community fall.  PMH includes COPD, pulm Htn, CHF, arthritis.    PT Comments    Pt ready to transfer to bed and nursing requested assistance.  Pt required multiple attempts to stand from chair, relying on UE's and cues from PT for hand placement and management of WB status.  Pt hesitant to stand upright but better able to manage RW once posture corrected.  She was able to complete approximately half of the transfer before stating that she felt "dizzy" and like she was "going to fall".   Pt assisted to bed and vitals monitored (WNL).  Pt apologetic throughout and required deep breathing exercises frequently to manage anxiety and pain.  She reported high pain level following mobility.  Pt able to demonstrate ability to perform there ex in chair prior to transfer with VC's and min manual cues.  Pt will continue to benefit from skilled PT with focus on strength, safe functional mobility, pain management.  Follow Up Recommendations  SNF     Equipment Recommendations  None recommended by PT    Recommendations for Other Services       Precautions / Restrictions Precautions Precautions: Fall Restrictions Weight Bearing Restrictions: Yes LLE Weight Bearing: Partial weight bearing LLE Partial Weight Bearing Percentage or Pounds: 50%    Mobility  Bed Mobility Overal bed mobility: Needs Assistance Bed Mobility: Sit to Supine     Supine to sit: Max assist;+2 for physical assistance;Total assist     General bed mobility comments: total assist for sit to supine due to pt feeling "dizzy" and like she is "going to pass out".  Able to assist in scooting up in bed minimally with R LE.  Transfers Overall transfer level: Needs assistance Equipment used: Rolling walker (2 wheeled) Transfers:  Stand Pivot Transfers   Stand pivot transfers: Max assist;+2 physical assistance       General transfer comment: 2 attempts to stand from chair with consistent VC's of encouragement, for wt shift and upright posture.  Pt did need reminder for hand placement and use of RW.  She was able to turn 45 degrees of pivot transfer before stating the she felt "dizzy" and like she was "going to fall", hanging her head forward and closing her eyes.  Pt knees began to buckle and PT encouraged pt to stand upright and take one more step back.  Unable to unlock low bed to move toward pt so PT assisted pt in moving back to sit on bed.  Pt apologetic and requiring guidance through breathing exercises throughout.  Vitals checked and BP WNL in supine.  RN administered pain medication and pt back to baseline before PT left.  Ambulation/Gait                 Stairs             Wheelchair Mobility    Modified Rankin (Stroke Patients Only)       Balance Overall balance assessment: Needs assistance   Sitting balance-Leahy Scale: Good Sitting balance - Comments: R lean away from surgical site.   Standing balance support: Bilateral upper extremity supported Standing balance-Leahy Scale: Poor Standing balance comment: New reliance on RW, flexed posture which requires multiple VC's to correct.  Cognition Arousal/Alertness: Awake/alert Behavior During Therapy: WFL for tasks assessed/performed;Anxious Overall Cognitive Status: Within Functional Limits for tasks assessed                                 General Comments: Pt apologizes throughout, stating that we need "masking tape" in case she calls out in pain.      Exercises General Exercises - Lower Extremity Ankle Circles/Pumps: 10 reps;Supine;AROM;Both Quad Sets: Right;10 reps;Supine Hip ABduction/ADduction: AAROM;Left;5 reps;Supine Straight Leg Raises: AAROM;Left;5 reps;Supine Other  Exercises Other Exercises: Breathing exercises for anxiety and pain management x4 min    General Comments        Pertinent Vitals/Pain Pain Assessment: Faces Faces Pain Scale: Hurts whole lot Pain Location: L hip during all mobility. Pain Descriptors / Indicators: Grimacing;Guarding Pain Intervention(s): Monitored during session;RN gave pain meds during session;Limited activity within patient's tolerance    Home Living Family/patient expects to be discharged to:: Skilled nursing facility Living Arrangements: Spouse/significant other Available Help at Discharge: Family;Available PRN/intermittently Type of Home: House Home Access: Stairs to enter   Home Layout: One level        Prior Function Level of Independence: Independent      Comments: Occasional use of a SPC.   PT Goals (current goals can now be found in the care plan section) Acute Rehab PT Goals Patient Stated Goal: To return to being a community ambulator. PT Goal Formulation: With patient Time For Goal Achievement: 09/02/19 Potential to Achieve Goals: Good Progress towards PT goals: Progressing toward goals    Frequency    BID      PT Plan Current plan remains appropriate    Co-evaluation              AM-PAC PT "6 Clicks" Mobility   Outcome Measure  Help needed turning from your back to your side while in a flat bed without using bedrails?: A Lot Help needed moving from lying on your back to sitting on the side of a flat bed without using bedrails?: A Lot Help needed moving to and from a bed to a chair (including a wheelchair)?: A Lot Help needed standing up from a chair using your arms (e.g., wheelchair or bedside chair)?: A Lot Help needed to walk in hospital room?: A Lot Help needed climbing 3-5 steps with a railing? : A Lot 6 Click Score: 12    End of Session Equipment Utilized During Treatment: Gait belt;Oxygen Activity Tolerance: Patient limited by pain Patient left: in bed;with  nursing/sitter in room Nurse Communication: Mobility status PT Visit Diagnosis: Unsteadiness on feet (R26.81);Muscle weakness (generalized) (M62.81);Pain Pain - Right/Left: Left Pain - part of body: Hip     Time: 0258-5277 PT Time Calculation (min) (ACUTE ONLY): 15 min  Charges:  $Therapeutic Activity: 8-22 mins                    Roxanne Gates, PT, DPT    Roxanne Gates 08/12/2019, 3:26 PM

## 2019-08-13 ENCOUNTER — Encounter: Payer: Self-pay | Admitting: Orthopedic Surgery

## 2019-08-13 LAB — GLUCOSE, CAPILLARY
Glucose-Capillary: 194 mg/dL — ABNORMAL HIGH (ref 70–99)
Glucose-Capillary: 186 mg/dL — ABNORMAL HIGH (ref 70–99)
Glucose-Capillary: 189 mg/dL — ABNORMAL HIGH (ref 70–99)
Glucose-Capillary: 147 mg/dL — ABNORMAL HIGH (ref 70–99)

## 2019-08-13 LAB — BASIC METABOLIC PANEL
Anion gap: 9 (ref 5–15)
BUN: 29 mg/dL — ABNORMAL HIGH (ref 8–23)
CO2: 30 mmol/L (ref 22–32)
Calcium: 8.6 mg/dL — ABNORMAL LOW (ref 8.9–10.3)
Chloride: 99 mmol/L (ref 98–111)
Creatinine, Ser: 1.15 mg/dL — ABNORMAL HIGH (ref 0.44–1.00)
GFR calc Af Amer: 52 mL/min — ABNORMAL LOW (ref >60)
GFR calc non Af Amer: 45 mL/min — ABNORMAL LOW (ref >60)
Glucose, Bld: 238 mg/dL — ABNORMAL HIGH (ref 70–99)
Potassium: 4.1 mmol/L (ref 3.5–5.1)
Sodium: 138 mmol/L (ref 135–145)

## 2019-08-13 LAB — CBC WITH DIFFERENTIAL/PLATELET
Abs Immature Granulocytes: 0.07 10*3/uL (ref 0.00–0.07)
Basophils Absolute: 0 10*3/uL (ref 0.0–0.1)
Basophils Relative: 0 %
Eosinophils Absolute: 0.4 10*3/uL (ref 0.0–0.5)
Eosinophils Relative: 3 %
HCT: 28.4 % — ABNORMAL LOW (ref 36.0–46.0)
Hemoglobin: 8.5 g/dL — ABNORMAL LOW (ref 12.0–15.0)
Immature Granulocytes: 1 %
Lymphocytes Relative: 12 %
Lymphs Abs: 1.7 10*3/uL (ref 0.7–4.0)
MCH: 24.8 pg — ABNORMAL LOW (ref 26.0–34.0)
MCHC: 29.9 g/dL — ABNORMAL LOW (ref 30.0–36.0)
MCV: 82.8 fL (ref 80.0–100.0)
Monocytes Absolute: 1 10*3/uL (ref 0.1–1.0)
Monocytes Relative: 7 %
Neutro Abs: 11.4 10*3/uL — ABNORMAL HIGH (ref 1.7–7.7)
Neutrophils Relative %: 77 %
Platelets: 168 10*3/uL (ref 150–400)
RBC: 3.43 MIL/uL — ABNORMAL LOW (ref 3.87–5.11)
RDW: 16.8 % — ABNORMAL HIGH (ref 11.5–15.5)
WBC: 14.6 10*3/uL — ABNORMAL HIGH (ref 4.0–10.5)
nRBC: 0 % (ref 0.0–0.2)

## 2019-08-13 MED ORDER — OXYCODONE-ACETAMINOPHEN 7.5-325 MG PO TABS
1.0000 | ORAL_TABLET | ORAL | Status: DC | PRN
  Administered 2019-08-13 – 2019-08-14 (×4): 1 via ORAL
  Filled 2019-08-13 (×5): qty 1

## 2019-08-13 MED ORDER — FE FUMARATE-B12-VIT C-FA-IFC PO CAPS
1.0000 | ORAL_CAPSULE | Freq: Two times a day (BID) | ORAL | Status: DC
  Administered 2019-08-13 – 2019-08-14 (×3): 1 via ORAL
  Filled 2019-08-13 (×4): qty 1

## 2019-08-13 NOTE — Care Management Important Message (Signed)
Important Message  Patient Details  Name: Destiny Zuniga MRN: [id number] Date of Birth: 05-Sep-1941   Medicare Important Message Given:  Yes     [name] 08/13/2019, 12:26 PM

## 2019-08-13 NOTE — Plan of Care (Signed)

## 2019-08-13 NOTE — [place] FL2 (Signed)
[place] MEDICAID FL2 LEVEL OF CARE SCREENING TOOL     IDENTIFICATION  Patient Name: Destiny Zuniga Birthdate: Jan 19, 1941 Sex: female Admission Date (Current Location): 08/10/2019  [place] and [place] Number:  Chiropodist and Address:  [facility], [address] [place], [place] [address]      Provider Number: [id number]  Attending Physician Name and Address:  Milagros Loll, MD  Relative Name and Phone Number:       Current Level of Care: Hospital Recommended Level of Care: Skilled Nursing Facility Prior Approval Number:    Date Approved/Denied:   PASRR Number: [id number]  Discharge Plan: SNF    Current Diagnoses: Patient Active Problem List   Diagnosis Date Noted  . Hip fracture (HCC) 08/10/2019    Orientation RESPIRATION BLADDER Height & Weight     Self, Time, Situation, Place  O2(2 Liters Oxygen.) Continent Weight: 215 lb (97.5 kg) Height:  5\' 2"  (157.5 cm)  BEHAVIORAL SYMPTOMS/MOOD NEUROLOGICAL BOWEL NUTRITION STATUS      Continent Diet(Diet: Heart Healthy/ Carb Modified.)  AMBULATORY STATUS COMMUNICATION OF NEEDS Skin   Extensive Assist Verbally Surgical wounds, Wound Vac(Incision: Left Hip. Provena Wound Vac.)                       Personal Care Assistance Level of Assistance  Bathing, Feeding, Dressing Bathing Assistance: Limited assistance Feeding assistance: Independent Dressing Assistance: Limited assistance     Functional Limitations Info  Sight, Hearing, Speech Sight Info: Adequate Hearing Info: Adequate Speech Info: Adequate    SPECIAL CARE FACTORS FREQUENCY  PT (By licensed PT), OT (By licensed OT)     PT Frequency: 5 OT Frequency: 5            Contractures      Additional Factors Info  Code Status, Allergies Code Status Info: Full Code. Allergies Info: Codeine, Midazolam, Celecoxib, Lisinopril           Current Medications (08/13/2019):  This is the current hospital  active medication list Current Facility-Administered Medications  Medication Dose Route Frequency Provider Last Rate Last Dose  . acetaminophen (TYLENOL) tablet 650 mg  650 mg Oral Q6H PRN 08/15/2019, MD   650 mg at 08/13/19 0149   Or  . acetaminophen (TYLENOL) suppository 650 mg  650 mg Rectal Q6H PRN 08/15/19, MD      . albuterol (PROVENTIL) (2.5 MG/3ML) 0.083% nebulizer solution 2.5 mg  2.5 mg Nebulization Q2H PRN Kennedy Bucker, MD      . alum & mag hydroxide-simeth (MAALOX/MYLANTA) 200-200-20 MG/5ML suspension 30 mL  30 mL Oral Q4H PRN 04-26-2001, MD      . aspirin EC tablet 325 mg  325 mg Oral Q breakfast Kennedy Bucker, MD   325 mg at 08/12/19 0835  . bisacodyl (DULCOLAX) suppository 10 mg  10 mg Rectal Daily PRN 08/14/19, MD      . carvedilol (COREG) tablet 3.125 mg  3.125 mg Oral BID Kennedy Bucker, MD   3.125 mg at 08/12/19 2216  . Chlorhexidine Gluconate Cloth 2 % PADS 6 each  6 each Topical Daily 2217, MD   6 each at 08/11/19 0530  . clopidogrel (PLAVIX) tablet 75 mg  75 mg Oral Daily 08/13/19, MD   75 mg at 08/12/19 0836  . docusate sodium (COLACE) capsule 100 mg  100 mg Oral BID 08/14/19, MD   100 mg at 08/12/19 1044  . ferrous fumarate-b12-vitamic C-folic acid (  TRINSICON / FOLTRIN) capsule 1 capsule  1 capsule Oral BID PC Kennedy BuckerMenz, Michael, MD      . fluticasone [facility](FLONASE) 50 MCG/ACT nasal spray 2 spray  2 spray Each Nare Daily Milagros LollSudini, Srikar, MD   2 spray at 08/12/19 1050  . fluticasone furoate-vilanterol (BREO ELLIPTA) 100-25 MCG/INH 1 puff  1 puff Inhalation Daily Sudini, Srikar, MD       And  . umeclidinium bromide (INCRUSE ELLIPTA) 62.5 MCG/INH 1 puff  1 puff Inhalation Daily Sudini, Srikar, MD      . furosemide (LASIX) tablet 40 mg  40 mg Oral BID Milagros LollSudini, Srikar, MD   40 mg at 08/12/19 1736  . gabapentin (NEURONTIN) tablet 1,200 mg  1,200 mg Oral q morning - 10a Milagros LollSudini, Srikar, MD   1,200 mg at 08/12/19 0837  . gabapentin (NEURONTIN) tablet 1,800  mg  1,800 mg Oral QHS Milagros LollSudini, Srikar, MD   1,800 mg at 08/12/19 2216  . insulin aspart (novoLOG) injection 0-5 Units  0-5 Units Subcutaneous QHS Kennedy BuckerMenz, Michael, MD   3 Units at 08/12/19 2216  . insulin aspart (novoLOG) injection 0-9 Units  0-9 Units Subcutaneous TID WC Kennedy BuckerMenz, Michael, MD   7 Units at 08/12/19 1735  . insulin aspart (novoLOG) injection 5 Units  5 Units Subcutaneous TID WC Milagros LollSudini, Srikar, MD   5 Units at 08/12/19 1736  . insulin glargine (LANTUS) injection 25 Units  25 Units Subcutaneous QHS Milagros LollSudini, Srikar, MD   25 Units at 08/12/19 2217  . isosorbide mononitrate (IMDUR) 24 hr tablet 30 mg  30 mg Oral BID Milagros LollSudini, Srikar, MD   30 mg at 08/12/19 1737  . levothyroxine (SYNTHROID) tablet 125 mcg  125 mcg Oral Daily Milagros LollSudini, Srikar, MD   125 mcg at 08/13/19 0506  . magnesium citrate solution 1 Bottle  1 Bottle Oral Once PRN Kennedy BuckerMenz, Michael, MD      . magnesium hydroxide (MILK OF MAGNESIA) suspension 30 mL  30 mL Oral Daily PRN Kennedy BuckerMenz, Michael, MD      . menthol-cetylpyridinium (CEPACOL) lozenge 3 mg  1 lozenge Oral PRN Kennedy BuckerMenz, Michael, MD       Or  . phenol (CHLORASEPTIC) mouth spray 1 spray  1 spray Mouth/Throat PRN Kennedy BuckerMenz, Michael, MD      . metoCLOPramide (REGLAN) tablet 5-10 mg  5-10 mg Oral Q8H PRN Kennedy BuckerMenz, Michael, MD       Or  . metoCLOPramide (REGLAN) injection 5-10 mg  5-10 mg Intravenous Q8H PRN Kennedy BuckerMenz, Michael, MD      . morphine 2 MG/ML injection 2 mg  2 mg Intravenous Q2H PRN Bertram SavinSimpson, Michael L, RPH      . mupirocin ointment (BACTROBAN) 2 % 1 application  1 application Nasal BID Kennedy BuckerMenz, Michael, MD   1 application at 08/12/19 2217  . nitroGLYCERIN (NITROSTAT) SL tablet 0.4 mg  0.4 mg Sublingual Q5 min PRN Milagros LollSudini, Srikar, MD      . ondansetron [facility](ZOFRAN) injection 4 mg  4 mg Intravenous PRN Kennedy BuckerMenz, Michael, MD   4 mg at 08/10/19 1150  . ondansetron (ZOFRAN) tablet 4 mg  4 mg Oral Q6H PRN Kennedy BuckerMenz, Michael, MD       Or  . ondansetron [facility](ZOFRAN) injection 4 mg  4 mg Intravenous Q6H PRN Kennedy BuckerMenz, Michael, MD       . oxyCODONE-acetaminophen (PERCOCET) 7.5-325 MG per tablet 1 tablet  1 tablet Oral Q4H PRN Sudini, Srikar, MD      . pantoprazole (PROTONIX) EC tablet 40 mg  40 mg Oral  Daily Hillary Bow, MD   40 mg at 08/12/19 0836  . polyethylene glycol (MIRALAX / GLYCOLAX) packet 17 g  17 g Oral Daily PRN Hessie Knows, MD      . simvastatin (ZOCOR) tablet 20 mg  20 mg Oral Daily Sudini, Alveta Heimlich, MD   20 mg at 08/12/19 1737  . zolpidem (AMBIEN) tablet 5 mg  5 mg Oral QHS PRN Hessie Knows, MD         Discharge Medications: Please see discharge summary for a list of discharge medications.  Relevant Imaging Results:  Relevant Lab Results:   Additional Information SSN: [id number]  Una Yeomans, Veronia Beets, LCSW

## 2019-08-13 NOTE — Progress Notes (Signed)
[employer] - [place] at [facility]   PATIENT NAME: Destiny Zuniga    MR#:  [id number]  DATE OF BIRTH:  [birth year]  SUBJECTIVE:  CHIEF COMPLAINT:   Chief Complaint  Patient presents with  . Fall   Chronic shortness of breath is the same Pain present  REVIEW OF SYSTEMS:    Review of Systems  Constitutional: Negative for chills and fever.  HENT: Negative for sore throat.   Eyes: Negative for blurred vision, double vision and pain.  Respiratory: Positive for shortness of breath (chronic). Negative for cough, hemoptysis and wheezing.   Cardiovascular: Negative for chest pain, palpitations, orthopnea and leg swelling.  Gastrointestinal: Negative for abdominal pain, constipation, diarrhea, heartburn, nausea and vomiting.  Genitourinary: Negative for dysuria and hematuria.  Musculoskeletal: Positive for joint pain. Negative for back pain.  Skin: Positive for itching. Negative for rash.  Neurological: Negative for sensory change, speech change, focal weakness and headaches.  Endo/Heme/Allergies: Does not bruise/bleed easily.  Psychiatric/Behavioral: Negative for depression. The patient is not nervous/anxious.    DRUG ALLERGIES:   Allergies  Allergen Reactions  . Codeine Anaphylaxis  . Midazolam Other (See Comments)    Altered mental status    . Celecoxib Other (See Comments)    Excessive bleeding   . Lisinopril Cough    VITALS:  Blood pressure (!) 124/55, pulse 63, temperature (!) 97.5 F (36.4 C), temperature source Oral, resp. rate 18, height 5\' 2"  (1.575 m), weight 97.5 kg, SpO2 97 %.  PHYSICAL EXAMINATION:   Physical Exam  GENERAL:  78 y.o.-year-old patient lying in the bed with no acute distress.  Obese EYES: Pupils equal, round, reactive to light and accommodation. No scleral icterus. Extraocular muscles intact.  HEENT: Head atraumatic, normocephalic. Oropharynx and nasopharynx clear.  NECK:  Supple, no jugular venous distention. No  thyroid enlargement, no tenderness.  LUNGS: Normal breath sounds bilaterally, no wheezing, rales, rhonchi. No use of accessory muscles of respiration.  CARDIOVASCULAR: S1, S2 normal. No murmurs, rubs, or gallops.  ABDOMEN: Soft, nontender, nondistended. Bowel sounds present. No organomegaly or mass.  EXTREMITIES: No cyanosis, clubbing or edema b/l.    NEUROLOGIC: Cranial nerves II through XII are intact. No focal Motor or sensory deficits b/l.   PSYCHIATRIC: The patient is alert and oriented x 3.  SKIN: Dressing over surgical site.  Wound VAC in place  LABORATORY PANEL:   CBC Recent Labs  Lab 08/13/19 0442  WBC 14.6*  HGB 8.5*  HCT 28.4*  PLT 168   ------------------------------------------------------------------------------------------------------------------ Chemistries  Recent Labs  Lab 08/13/19 0442  NA 138  K 4.1  CL 99  CO2 30  GLUCOSE 238*  BUN 29*  CREATININE 1.15*  CALCIUM 8.6*   ------------------------------------------------------------------------------------------------------------------  Cardiac Enzymes No results for input(s): TROPONINI in the last 168 hours. ------------------------------------------------------------------------------------------------------------------  RADIOLOGY:  No results found.   ASSESSMENT AND PLAN:   * Acute left intertrochanteric fracture with subtrochanteric extension. S/p repair POD - 2 Pain medications as needed. Added percocet Physical therapy to see SNF at discharge SCDs for DVT prophylaxis at this time.  Lovenox or heparin per orthopedic team. SNF when cleared by orthopedics  *Syncope.  Patient has chronic lightheadedness and presyncope.  she fell and lost consciousness.    CT head with nothing acute.  Will need outpatient follow-up with cardiology to finish work-up.  *Diabetes mellitus.  Sliding scale insulin added.  Diabetic diet.  We will continue Lantus from home.  *Hypertension.  Continue home  medications  *Chronic  diastolic CHF.  No signs of fluid overload.   Continue home Lasix  *COPD with chronic respiratory failure.  Continue oxygen, nebulizers and inhalers  All the records are reviewed and case discussed with Care Management/Social Worker Management plans discussed with the patient, family and they are in agreement.  CODE STATUS: FULL CODE  DVT Prophylaxis: SCDs  TOTAL TIME TAKING CARE OF THIS PATIENT: 35 minutes.   POSSIBLE D/C IN 1-2 DAYS, DEPENDING ON CLINICAL CONDITION.  Leia Alf Elian Gloster M.D on 08/13/2019 at 11:13 AM  Between 7am to 6pm - Pager - [phone]  After 6pm go to [email or web address] - password [email or web address] [facility] [place]  Office  [phone]  CC: Primary care physician; System, Pcp Not In  Note: This dictation was prepared with Dragon dictation along with smaller phrase technology. Any transcriptional errors that result from this process are unintentional.

## 2019-08-13 NOTE — Progress Notes (Signed)
Physical Therapy Treatment Patient Details Name: Destiny Zuniga MRN: [id number] DOB: [birth year] Today's Date: 08/13/2019    History of Present Illness Pt admitted s/p L I.M. nailing following a community fall.  PMH includes COPD, pulm Htn, CHF, arthritis.    PT Comments    Pt agreeable to PT for exercises only at this time. Pt notes her LLE pain has "finally calmed down" post medication. Pt also shares she did not sleep most of the night and is extremely tired. Pt reports current LLE pain is 6/10. Pt participates in and re educated on exercises with assist and rest as needed. Pt continues to have limited movement on L and demonstrates decreased isometric strength with exercises as well. Increased instruction/cues to initiate and improve L QS. Pt agreeable to attempt out of bed this afternoon post further pain medication and rest. Re attempt approximately 15:30 (pt due for pain medication at 15:00) to continue progression of strength, endurance and functional mobility.    Follow Up Recommendations  SNF     Equipment Recommendations  None recommended by PT    Recommendations for Other Services       Precautions / Restrictions Precautions Precautions: Fall Restrictions Weight Bearing Restrictions: Yes LLE Weight Bearing: Partial weight bearing LLE Partial Weight Bearing Percentage or Pounds: 50%    Mobility  Bed Mobility               General bed mobility comments: Not tested per pt request due to lethargy and finally getting pain to calm down  Transfers                    Ambulation/Gait                 Stairs             Wheelchair Mobility    Modified Rankin (Stroke Patients Only)       Balance                                            Cognition Arousal/Alertness: Lethargic Behavior During Therapy: WFL for tasks assessed/performed;Anxious Overall Cognitive Status: Within Functional Limits for tasks  assessed                                        Exercises General Exercises - Lower Extremity Ankle Circles/Pumps: AROM;Both;20 reps Quad Sets: Strengthening;Both;10 reps(2 sets; requires increased instruction. Poor on L) Gluteal Sets: Strengthening;Both;20 reps Short Arc Quad: AAROM;Left;20 reps(AROM R) Heel Slides: AAROM;Left;20 reps(limited range; AROM on R (limits range as well)) Hip ABduction/ADduction: AAROM;Both;20 reps Straight Leg Raises: PROM;Both;10 reps Other Exercises Other Exercises: Breathing during exercise    General Comments        Pertinent Vitals/Pain Pain Assessment: 0-10 Pain Score: 6 (pt notes it has calmed from 10+ post medication) Pain Location: L hip/LE Pain Descriptors / Indicators: Aching;Constant;Grimacing;Guarding Pain Intervention(s): Limited activity within patient's tolerance;Premedicated before session    Home Living                      Prior Function            PT Goals (current goals can now be found in the care plan section) Progress towards PT goals: Progressing toward goals(slowly)  Frequency    BID      PT Plan Current plan remains appropriate    Co-evaluation              AM-PAC PT "6 Clicks" Mobility   Outcome Measure  Help needed turning from your back to your side while in a flat bed without using bedrails?: A Lot Help needed moving from lying on your back to sitting on the side of a flat bed without using bedrails?: A Lot Help needed moving to and from a bed to a chair (including a wheelchair)?: A Lot Help needed standing up from a chair using your arms (e.g., wheelchair or bedside chair)?: A Lot Help needed to walk in hospital room?: A Lot Help needed climbing 3-5 steps with a railing? : Total 6 Click Score: 11    End of Session Equipment Utilized During Treatment: Oxygen Activity Tolerance: Patient limited by lethargy;Patient limited by pain Patient left: in bed;with call  bell/phone within reach;with bed alarm set   PT Visit Diagnosis: Unsteadiness on feet (R26.81);Muscle weakness (generalized) (M62.81);Pain Pain - Right/Left: Left Pain - part of body: Hip     Time: 9470-9628 PT Time Calculation (min) (ACUTE ONLY): 34 min  Charges:  $Therapeutic Exercise: 23-37 mins                      Scot Dock, PTA 08/13/2019, 11:50 AM

## 2019-08-13 NOTE — Progress Notes (Signed)
Subjective: 2 Days Post-Op Procedure(s) (LRB): INTRAMEDULLARY (IM) NAIL INTERTROCHANTRIC with wound vac application (Left) Patient reports pain as 7 on 0-10 scale.   Patient is well, and has had no acute complaints or problems Current plan is for d/c to SNF when medically appropriate. Negative for chest pain and shortness of breath Fever: no Gastrointestinal:Negative for nausea and vomiting  Objective: Vital signs in last 24 hours: Temp:  [97.7 F (36.5 C)-98.2 F (36.8 C)] 98.2 F (36.8 C) (10/26 0028) Pulse Rate:  [59-81] 81 (10/26 0028) Resp:  [18] 18 (10/26 0028) BP: (109-144)/(55-66) 118/55 (10/26 0028) SpO2:  [96 %-100 %] 96 % (10/26 0028) Weight:  [97.5 kg] 97.5 kg (10/26 0500)  Intake/Output from previous day:  Intake/Output Summary (Last 24 hours) at 08/13/2019 0734 Last data filed at 08/13/2019 0529 Gross per 24 hour  Intake 600 ml  Output 1700 ml  Net -1100 ml    Intake/Output this shift: No intake/output data recorded.  Labs: Recent Labs    08/10/19 1140 08/11/19 0446 08/12/19 0635 08/13/19 0442  HGB 10.2* 9.9* 8.6* 8.5*   Recent Labs    08/12/19 0635 08/13/19 0442  WBC 16.1* 14.6*  RBC 3.43* 3.43*  HCT 28.4* 28.4*  PLT 185 168   Recent Labs    08/12/19 0635 08/13/19 0442  NA 135 138  K 5.0 4.1  CL 97* 99  CO2 27 30  BUN 25* 29*  CREATININE 1.27* 1.15*  GLUCOSE 368* 238*  CALCIUM 8.7* 8.6*   Recent Labs    08/10/19 1140  INR 1.1     EXAM General - Patient is Alert, Appropriate and Oriented Extremity - ABD soft Sensation intact distally Intact pulses distally Dorsiflexion/Plantar flexion intact No cellulitis present Compartment soft Dressing/Incision - Two more distal incisions without any drainage.  Woundvac intact to the most proximal incision without any drainage. Motor Function - intact, moving foot and toes well on exam.   Past Medical History:  Diagnosis Date  . Arthritis   . CHF (congestive heart failure) ([place])    . COPD (chronic obstructive pulmonary disease) ([place])   . Diabetes mellitus without complication ([place])   . Pulmonary hypertension ([place])   . Tricuspid regurgitation    Assessment/Plan: 2 Days Post-Op Procedure(s) (LRB): INTRAMEDULLARY (IM) NAIL INTERTROCHANTRIC with wound vac application (Left) Active Problems:   Hip fracture (HCC)  Estimated body mass index is 39.32 kg/m as calculated from the following:   Height as of this encounter: 5\' 2"  (1.575 m).   Weight as of this encounter: 97.5 kg. Up with therapy  Labs reviewed this AM, WBC is 14.6, down from 16.1.  No SOB, chest pain or urinary symptoms. Encouraged incentive spirometer. Hg 8.5 this AM, continue to monitor. Patient has had a BM. Continue with PT today, current plan is for d/c to SNF.  Will monitor how patient performs today.  DVT Prophylaxis - TED hose and Plavix 50% weightbearing to the left leg with therapy.  Raquel Maxene [place]ington, PA-C [facility] Orthopaedic Surgery 08/13/2019, 7:34 AM

## 2019-08-13 NOTE — TOC Initial Note (Addendum)
Transition of Care [facility]) - Initial/Assessment Note    Patient Details  Name: Destiny Zuniga MRN: [id number] Date of Birth: 1941-06-02  Transition of Care [facility]) CM/SW Contact:    Mory Herrman, Lenice Llamas Phone Number: [phone]  08/13/2019, 2:33 PM  Clinical Narrative: PT is recommending SNF. Clinical Education officer, museum (CSW) met with patient alone at bedside to discuss D/C plan. Patient was alert and oriented X4 and was laying in the bed. CSW introduced self and explained role of CSW department. Per patient she did not sleep last night and had issues with pain this morning. CSW provided emotional support. Patient reported that she lives in [place] with her significant other Don. Patient reported that she has 2 adult daughters Lattie Haw [phone] and Claiborne Billings. Patient reported that Lattie Haw and Timmothy Sours will help her make decisions about her D/C plan. Patient gave verbal permission for CSW to call Timmothy Sours and Lattie Haw. Patient reported that she got hurt in [facility] because she was on her way back home from [place] with 2 dogs. Patient reported that the dogs have been picked up by a family member and are safe. CSW explained SNF process and that medicare requires a 3 night qualifying inpatient hospital stay in order to pay for SNF. Patient was admitted to inpatient 10/23. Patient reported that she is considering going to SNF in [facility] and is open to going to SNF in [place]. CSW explained that if patient goes to SNF outside of 50 miles then EMS will charge patient private pay outside of the 50 miles. CSW explained that if patient goes to a SNF in [facility] they can provide transport for patient to come back to her ortho follow up appointment at Dr. Theodore Demark office. Patient reported that she likes Dr. Rudene Christians and wants him to continue to oversee her care. Patient reported that she will go to a SNF in [facility]. FL2 complete and faxed out.   CSW presented SNF bed offers to patient and  provided her with CMS SNF list. CSW contacted patient's significant other Timmothy Sours and made him aware of above. Timmothy Sours is in agreement with patient going to a SNF in [place]. CSW also contacted patient's daughter Lattie Haw and made her aware of above. CSW presented SNF bed offers to [place]. Per Lattie Haw she will accept bed offer from [facility] if [facility] does not have a bed. Per [facility] admissions coordinator at [facility] she will review referral and get back to CSW. CSW will continue to follow and assist as needed.      4 pm: [facility] made a bed offer. Patient and her daughter Lattie Haw accepted bed offer from [facility]. [facility] admissions coordinator at [facility] is aware of above.              Expected Discharge Plan: [place] Barriers to Discharge: Continued Medical Work up   Patient Goals and CMS Choice Patient states their goals for this hospitalization and ongoing recovery are:: To get a better night sleep and pain control. CMS Medicare.gov Compare Post Acute Care list provided to:: Patient Choice offered to / list presented to : Patient  Expected Discharge Plan and Services Expected Discharge Plan: [place] In-house Referral: Clinical Social Work   Post Acute Care Choice: [place] Living arrangements for the past 2 months: [place]                 DME Arranged: N/A  HH Arranged: NA          Prior Living Arrangements/Services Living arrangements for the past 2 months: Single Family Home Lives with:: Significant Other Patient language and need for interpreter reviewed:: No Do you feel safe going back to the place where you live?: Yes      Need for Family Participation in Patient Care: Yes (Comment) Care giver support system in place?: Yes (comment)   Criminal Activity/Legal Involvement Pertinent to Current Situation/Hospitalization: No - Comment as needed  Activities of Daily  Living Home Assistive Devices/Equipment: Cane (specify quad or straight) ADL Screening (condition at time of admission) Patient's cognitive ability adequate to safely complete daily activities?: Yes Is the patient deaf or have difficulty hearing?: Yes Does the patient have difficulty seeing, even when wearing glasses/contacts?: No Does the patient have difficulty concentrating, remembering, or making decisions?: No Patient able to express need for assistance with ADLs?: No Does the patient have difficulty dressing or bathing?: No Independently performs ADLs?: Yes (appropriate for developmental age) Does the patient have difficulty walking or climbing stairs?: No Weakness of Legs: None Weakness of Arms/Hands: None  Permission Sought/Granted Permission sought to share information with : Chartered certified accountant granted to share information with : Yes, Verbal Permission Granted              Emotional Assessment Appearance:: Appears stated age Attitude/Demeanor/Rapport: Engaged Affect (typically observed): Pleasant, Calm Orientation: : Oriented to Self, Oriented to Place, Oriented to  Time, Oriented to Situation Alcohol / Substance Use: Not Applicable Psych Involvement: No (comment)  Admission diagnosis:  Closed fracture of left hip, initial encounter ([place]) [S72.002A] Syncope, unspecified syncope type [R55] Patient Active Problem List   Diagnosis Date Noted  . Hip fracture ([place]) 08/10/2019   PCP:  System, Pcp Not In Pharmacy:   [id number] [place] - [id number] [id number]: [phone]: [phone]    Social Determinants of Health (SDOH) Interventions    Readmission Risk Interventions No flowsheet data found.

## 2019-08-13 NOTE — Progress Notes (Signed)
Physical Therapy Treatment Patient Details Name: Destiny Zuniga MRN: [id number] DOB: 07/28/1941 Today's Date: 08/13/2019    History of Present Illness Pt admitted s/p L I.M. nailing following a community fall.  PMH includes COPD, pulm Htn, CHF, arthritis.    PT Comments    Pt continues feeling very lethargic noting she has only "cat napped" today. Pt reports 7/10 pain and has not received pain medicine as planned this morning, but agreeable to try. Pt participates in LLE exercises with assist. Mild improvement in ability to perform QS on L; pt states she has been practicing today. Endorsed continued work on [employer]. Pt educated on self assist for L heel slide using hand or towel. Continue PT to progress strength, endurance, balance to improve functional mobility with proper weight bearing restrictions.    Follow Up Recommendations  SNF     Equipment Recommendations  None recommended by PT    Recommendations for Other Services       Precautions / Restrictions Precautions Precautions: Fall Restrictions Weight Bearing Restrictions: Yes LLE Weight Bearing: Partial weight bearing LLE Partial Weight Bearing Percentage or Pounds: 50%    Mobility  Bed Mobility               General bed mobility comments: Not tested; pt reports too much pain and continues very lethargic  Transfers                    Ambulation/Gait                 Stairs             Wheelchair Mobility    Modified Rankin (Stroke Patients Only)       Balance                                            Cognition Arousal/Alertness: Lethargic Behavior During Therapy: WFL for tasks assessed/performed;Anxious Overall Cognitive Status: Within Functional Limits for tasks assessed                                        Exercises General Exercises - Lower Extremity Ankle Circles/Pumps: AROM;Both;20 reps Quad Sets:  Strengthening;Both;10 reps Gluteal Sets: Strengthening;Both;10 reps Short Arc QuadSinclair Ship;Left;20 reps Heel Slides: AAROM;Left;20 reps(limited range) Hip ABduction/ADduction: AAROM;Left;20 reps Straight Leg Raises: PROM;Left;10 reps    General Comments        Pertinent Vitals/Pain Pain Assessment: 0-10 Pain Score: 7 (With movement) Pain Location: L hip/LE Pain Descriptors / Indicators: Aching;Constant;Grimacing;Guarding Pain Intervention(s): Limited activity within patient's tolerance    Home Living                      Prior Function            PT Goals (current goals can now be found in the care plan section) Progress towards PT goals: Progressing toward goals(slowly)    Frequency    BID      PT Plan Current plan remains appropriate    Co-evaluation              AM-PAC PT "6 Clicks" Mobility   Outcome Measure  Help needed turning from your back to your side while in a flat bed without using bedrails?: A Lot Help  needed moving from lying on your back to sitting on the side of a flat bed without using bedrails?: A Lot Help needed moving to and from a bed to a chair (including a wheelchair)?: A Lot Help needed standing up from a chair using your arms (e.g., wheelchair or bedside chair)?: A Lot Help needed to walk in hospital room?: A Lot Help needed climbing 3-5 steps with a railing? : Total 6 Click Score: 11    End of Session Equipment Utilized During Treatment: Oxygen Activity Tolerance: Patient limited by lethargy;Patient limited by pain Patient left: in bed;with call bell/phone within reach;with bed alarm set   PT Visit Diagnosis: Unsteadiness on feet (R26.81);Muscle weakness (generalized) (M62.81);Pain Pain - Right/Left: Left Pain - part of body: Hip     Time: 1530-1550 PT Time Calculation (min) (ACUTE ONLY): 20 min  Charges:  $Therapeutic Exercise: 8-22 mins                      Scot Dock, PTA 08/13/2019, 4:41 PM

## 2019-08-14 LAB — CBC
HCT: 34.6 % — ABNORMAL LOW (ref 36.0–46.0)
Hemoglobin: 10.1 g/dL — ABNORMAL LOW (ref 12.0–15.0)
MCH: 24.8 pg — ABNORMAL LOW (ref 26.0–34.0)
MCHC: 29.2 g/dL — ABNORMAL LOW (ref 30.0–36.0)
MCV: 85 fL (ref 80.0–100.0)
Platelets: 216 10*3/uL (ref 150–400)
RBC: 4.07 MIL/uL (ref 3.87–5.11)
RDW: 17.4 % — ABNORMAL HIGH (ref 11.5–15.5)
WBC: 13.5 10*3/uL — ABNORMAL HIGH (ref 4.0–10.5)
nRBC: 0 % (ref 0.0–0.2)

## 2019-08-14 LAB — GLUCOSE, CAPILLARY
Glucose-Capillary: 105 mg/dL — ABNORMAL HIGH (ref 70–99)
Glucose-Capillary: 227 mg/dL — ABNORMAL HIGH (ref 70–99)

## 2019-08-14 MED ORDER — FE FUMARATE-B12-VIT C-FA-IFC PO CAPS: 1.0000 | ORAL_CAPSULE | Freq: Two times a day (BID) | ORAL | 0 refills | Status: AC

## 2019-08-14 MED ORDER — OXYCODONE HCL 5 MG PO TABS: 5.0000 mg | ORAL_TABLET | ORAL | 0 refills | Status: DC | PRN

## 2019-08-14 MED ORDER — TRAMADOL HCL 50 MG PO TABS: 50.0000 mg | ORAL_TABLET | Freq: Four times a day (QID) | ORAL | 0 refills | Status: DC | PRN

## 2019-08-14 MED ORDER — FLEET ENEMA 7-19 GM/118ML RE ENEM
1.0000 | ENEMA | Freq: Once | RECTAL | Status: AC
  Administered 2019-08-14: 1 via RECTAL

## 2019-08-14 NOTE — Progress Notes (Signed)
PT Cancellation Note  Patient Details Name: Destiny Zuniga MRN: [id number] DOB: [birth year]   Cancelled Treatment:     Pt in bed on bedpan with emesis basin.  Nausea and general malaise.  RN in addressing issues. Unable to participate at this time.    Chesley Noon 08/14/2019, 10:55 AM

## 2019-08-14 NOTE — Progress Notes (Signed)
RN called report to [facility]. Report given Early Osmond LPN.

## 2019-08-14 NOTE — Progress Notes (Signed)
Destiny Zuniga  A and O x 4. VSS. Pt tolerating diet well. No complaints of pain. IV removed intact, prescriptions given. Pt voiced understanding of discharge instructions with no further questions. Pt discharged via EMS to [facility].     Allergies as of 08/14/2019      Reactions   Codeine Anaphylaxis   Midazolam Other (See Comments)   Altered mental status    Celecoxib Other (See Comments)   Excessive bleeding   Lisinopril Cough      Medication List    TAKE these medications   albuterol 108 (90 Base) MCG/ACT inhaler Commonly known as: VENTOLIN HFA Inhale 2 puffs into the lungs every 6 (six) hours as needed for wheezing or shortness of breath.   carvedilol 3.125 MG tablet Commonly known as: COREG Take 3.125 mg by mouth 2 (two) times daily.   cetirizine 10 MG tablet Commonly known as: ZYRTEC Take 10 mg by mouth daily as needed for allergies.   clopidogrel 75 MG tablet Commonly known as: PLAVIX Take 75 mg by mouth daily.   diclofenac sodium 1 % Gel Commonly known as: VOLTAREN Apply 4 g topically 3 (three) times daily as needed (joint pain). (apply to hands and knees)   ferrous fumarate-b12-vitamic C-folic acid capsule Commonly known as: TRINSICON / FOLTRIN Take 1 capsule by mouth 2 (two) times daily after a meal.   fluticasone 50 MCG/ACT nasal spray Commonly known as: FLONASE Place 2 sprays into both nostrils daily.   furosemide 20 MG tablet Commonly known as: LASIX Take 40 mg by mouth 2 (two) times daily.   gabapentin 600 MG tablet Commonly known as: NEURONTIN Take 1,200-1,800 mg by mouth See admin instructions. Take 2 tablets (1200mg ) by mouth every morning and take 3 tablets (1800mg ) by mouth every night   Insulin Detemir 100 UNIT/ML Pen Commonly known as: LEVEMIR Inject 25 Units into the skin daily with supper.   isosorbide mononitrate 30 MG 24 hr tablet Commonly known as: IMDUR Take 30 mg by mouth 2 (two) times daily.   losartan 100 MG  tablet Commonly known as: COZAAR Take 100 mg by mouth daily.   nitroGLYCERIN 0.4 MG/SPRAY spray Commonly known as: NITROLINGUAL Place 1 spray under the tongue every 5 (five) minutes as needed for chest pain.   NovoLOG 100 UNIT/ML injection Generic drug: insulin aspart Inject 20-30 Units into the skin 3 (three) times daily with meals. (plus adjustments as directed)   omeprazole 20 MG capsule Commonly known as: PRILOSEC Take 20 mg by mouth daily.   oxyCODONE 5 MG immediate release tablet Commonly known as: Roxicodone Take 1-2 tablets (5-10 mg total) by mouth every 4 (four) hours as needed for severe pain.   potassium chloride 10 MEQ tablet Commonly known as: KLOR-CON Take 10 mEq by mouth daily.   simvastatin 20 MG tablet Commonly known as: ZOCOR Take 20 mg by mouth daily.   Synthroid 125 MCG tablet Generic drug: levothyroxine Take 125 mcg by mouth daily.   traMADol 50 MG tablet Commonly known as: ULTRAM Take 1 tablet (50 mg total) by mouth every 6 (six) hours as needed for moderate pain. What changed: how much to take   Trelegy Ellipta 100-62.5-25 MCG/INH Aepb Generic drug: Fluticasone-Umeclidin-Vilant Inhale 1 puff into the lungs daily.       Vitals:   08/13/19 2330 08/14/19 0745  BP: 112/62 136/73  Pulse: (!) 52 92  Resp: 17 18  Temp: 97.8 F (36.6 C) 98.6 F (37 C)  SpO2: 94%  99%    Francesco Sor

## 2019-08-14 NOTE — TOC Transition Note (Addendum)
Transition of Care [facility]) - CM/SW Discharge Note   Patient Details  Name: Destiny Zuniga MRN: [id number] Date of Birth: 1941-10-04  Transition of Care [facility]) CM/SW Contact:  Shaquill Iseman, Lenice Llamas Phone Number: [phone]  08/14/2019, 12:01 PM   Clinical Narrative: Patient is medically stable for D/C to [facility] today. Per [facility] admissions coordinator at [facility] patient can come today to room 403. Magda Paganini is aware that patient's most recent negative covid test was on 10/23. RN will call report and arrange EMS for transport. Clinical Education officer, museum (CSW) sent D/C orders to [facility] via [place]. Patient is aware of above. Patient's daughter Lattie Haw is aware of above. Patient's significant other Timmothy Sours is aware of above. Please reconsult if future social work needs arise. CSW signing off.     Final next level of care: Skilled Nursing Facility Barriers to Discharge: Barriers Resolved   Patient Goals and CMS Choice Patient states their goals for this hospitalization and ongoing recovery are:: To feel better. CMS Medicare.gov Compare Post Acute Care list provided to:: Patient Choice offered to / list presented to : Patient  Discharge Placement PASRR number recieved: 08/13/19            Patient chooses bed at: [facility] Patient to be transferred to facility by: [facility] Name of family member notified: Patient's daughter Lattie Haw and significant other Timmothy Sours are aware of D/C today. Patient and family notified of of transfer: 08/14/19  Discharge Plan and Services In-house Referral: Clinical Social Work   Post Acute Care Choice: [place]          DME Arranged: N/A         HH Arranged: NA          Social Determinants of Health (SDOH) Interventions     Readmission Risk Interventions No flowsheet data found.

## 2019-08-14 NOTE — Discharge Instructions (Signed)
-  Woundvac can be removed and a dry dressing applied on 08/20/19 by SNF facility.  Patient to be followed by primary care physician in [facility] after she is discharged from the rehab.

## 2019-08-14 NOTE — Discharge Summary (Addendum)
[facility] [place] [facility]   PATIENT NAME: Destiny Zuniga    MR#:  [id number]  DATE OF BIRTH:  May 17, 1941  DATE OF ADMISSION:  08/10/2019 ADMITTING PHYSICIAN: Milagros Loll, MD  DATE OF DISCHARGE: 08/14/2019  PRIMARY CARE PHYSICIAN: System, Pcp Not In    ADMISSION DIAGNOSIS:  Closed fracture of left hip, initial encounter (HCC) [S72.002A] Syncope, unspecified syncope type [R55]  DISCHARGE DIAGNOSIS:  left inter-trochanteric fracture with sub trochanteric extension status post repair postop day three  SECONDARY DIAGNOSIS:   Past Medical History:  Diagnosis Date  . Arthritis   . CHF (congestive heart failure) (HCC)   . COPD (chronic obstructive pulmonary disease) (HCC)   . Diabetes mellitus without complication (HCC)   . Pulmonary hypertension (HCC)   . Tricuspid regurgitation     HOSPITAL COURSE:  Destiny Zuniga  is a 78 y.o. female with a known history of diabetes mellitus, COPD, chronic respiratory failure, diastolic CHF, tricuspid regurgitation, moderate to severe pulmonary hypertension, chronic shortness of breath and dizziness presents to the emergency room after she fell landing on her left hip.  Patient was stepping over the curb and reaching for the door got lightheaded and fell.  *Acute left intertrochanteric fracture with subtrochanteric extension. S/p repair POD - 3 Pain medications as needed. Added percocet Physical therapy input appreciated SNF at discharge SCDs for DVT prophylaxis at this time. Lovenox or heparin per orthopedic team-- message d ortho for DVT prophylaxis.  *Syncope. Patient has chronic lightheadedness and presyncope. she fell and lost consciousness.   CT head with nothing acute.  -Will need outpatient follow-up with cardiology to finish work-up-- and sees cardiology in [place]. Her daughter Misty Stanley has rescheduled the appointment.  *Diabetes mellitus. Sliding scale insulin added. Diabetic  diet. We will continue Lantus from home.  *Hypertension. Continue home medications  *Chronic diastolic CHF. No signs of fluid overload.  Continue home Lasix  *COPD with chronic respiratory failure. Continue oxygen, nebulizers and inhalers  Overall patient is hemodynamically stable. She does have some pain from her hip surgery. Encouraged her to continue rehab. Patient will discharged to [facility] and  okay with orthopedic Dr Rosita Kea  *DVT prophylaxis per ortho aspirin and Plavix should suffice--d/w dr Rosita Kea CONSULTS OBTAINED:  Treatment Team:  Kennedy Bucker, MD  DRUG ALLERGIES:   Allergies  Allergen Reactions  . Codeine Anaphylaxis  . Midazolam Other (See Comments)    Altered mental status    . Celecoxib Other (See Comments)    Excessive bleeding   . Lisinopril Cough    DISCHARGE MEDICATIONS:   Allergies as of 08/14/2019      Reactions   Codeine Anaphylaxis   Midazolam Other (See Comments)   Altered mental status    Celecoxib Other (See Comments)   Excessive bleeding   Lisinopril Cough      Medication List    TAKE these medications   albuterol 108 (90 Base) MCG/ACT inhaler Commonly known as: VENTOLIN HFA Inhale 2 puffs into the lungs every 6 (six) hours as needed for wheezing or shortness of breath.   carvedilol 3.125 MG tablet Commonly known as: COREG Take 3.125 mg by mouth 2 (two) times daily.   cetirizine 10 MG tablet Commonly known as: ZYRTEC Take 10 mg by mouth daily as needed for allergies.   clopidogrel 75 MG tablet Commonly known as: PLAVIX Take 75 mg by mouth daily.   diclofenac sodium 1 % Gel Commonly known as: VOLTAREN Apply 4 g topically 3 (  three) times daily as needed (joint pain). (apply to hands and knees)   ferrous fumarate-b12-vitamic C-folic acid capsule Commonly known as: TRINSICON / FOLTRIN Take 1 capsule by mouth 2 (two) times daily after a meal.   fluticasone 50 MCG/ACT nasal spray Commonly known as: FLONASE Place  2 sprays into both nostrils daily.   furosemide 20 MG tablet Commonly known as: LASIX Take 40 mg by mouth 2 (two) times daily.   gabapentin 600 MG tablet Commonly known as: NEURONTIN Take 1,200-1,800 mg by mouth See admin instructions. Take 2 tablets (1200mg ) by mouth every morning and take 3 tablets (1800mg ) by mouth every night   Insulin Detemir 100 UNIT/ML Pen Commonly known as: LEVEMIR Inject 25 Units into the skin daily with supper.   isosorbide mononitrate 30 MG 24 hr tablet Commonly known as: IMDUR Take 30 mg by mouth 2 (two) times daily.   losartan 100 MG tablet Commonly known as: COZAAR Take 100 mg by mouth daily.   nitroGLYCERIN 0.4 MG/SPRAY spray Commonly known as: NITROLINGUAL Place 1 spray under the tongue every 5 (five) minutes as needed for chest pain.   NovoLOG 100 UNIT/ML injection Generic drug: insulin aspart Inject 20-30 Units into the skin 3 (three) times daily with meals. (plus adjustments as directed)   omeprazole 20 MG capsule Commonly known as: PRILOSEC Take 20 mg by mouth daily.   oxyCODONE 5 MG immediate release tablet Commonly known as: Roxicodone Take 1-2 tablets (5-10 mg total) by mouth every 4 (four) hours as needed for severe pain.   potassium chloride 10 MEQ tablet Commonly known as: KLOR-CON Take 10 mEq by mouth daily.   simvastatin 20 MG tablet Commonly known as: ZOCOR Take 20 mg by mouth daily.   Synthroid 125 MCG tablet Generic drug: levothyroxine Take 125 mcg by mouth daily.   traMADol 50 MG tablet Commonly known as: ULTRAM Take 1 tablet (50 mg total) by mouth every 6 (six) hours as needed for moderate pain. What changed: how much to take   Trelegy Ellipta 100-62.5-25 MCG/INH Aepb Generic drug: Fluticasone-Umeclidin-Vilant Inhale 1 puff into the lungs daily.       If you experience worsening of your admission symptoms, develop shortness of breath, life threatening emergency, suicidal or homicidal thoughts you must seek  medical attention immediately by calling 911 or calling your MD immediately  if symptoms less severe.  You Must read complete instructions/literature along with all the possible adverse reactions/side effects for all the Medicines you take and that have been prescribed to you. Take any new Medicines after you have completely understood and accept all the possible adverse reactions/side effects.   Please note  You were cared for by a hospitalist during your hospital stay. If you have any questions about your discharge medications or the care you received while you were in the hospital after you are discharged, you can call the unit and asked to speak with the hospitalist on call if the hospitalist that took care of you is not available. Once you are discharged, your primary care physician will handle any further medical issues. Please note that NO REFILLS for any discharge medications will be authorized once you are discharged, as it is imperative that you return to your primary care physician (or establish a relationship with a primary care physician if you do not have one) for your aftercare needs so that they can reassess your need for medications and monitor your lab values. Today   SUBJECTIVE  Hip pain emotional about  going to rehab since she will be able to see her family.   VITAL SIGNS:  Blood pressure 136/73, pulse 92, temperature 98.6 F (37 C), resp. rate 18, height 5\' 2"  (1.575 m), weight 97.5 kg, SpO2 99 %.  I/O:    Intake/Output Summary (Last 24 hours) at 08/14/2019 0815 Last data filed at 08/14/2019 0500 Gross per 24 hour  Intake 480 ml  Output 1300 ml  Net -820 ml    PHYSICAL EXAMINATION:  GENERAL:  78 y.o.-year-old patient lying in the bed with no acute distress. Obese EYES: Pupils equal, round, reactive to light and accommodation. No scleral icterus. Extraocular muscles intact.  HEENT: Head atraumatic, normocephalic. Oropharynx and nasopharynx clear.  NECK:  Supple,  no jugular venous distention. No thyroid enlargement, no tenderness.  LUNGS: Normal breath sounds bilaterally, no wheezing, rales,rhonchi or crepitation. No use of accessory muscles of respiration.  CARDIOVASCULAR: S1, S2 normal. No murmurs, rubs, or gallops.  ABDOMEN: Soft, non-tender, non-distended. Bowel sounds present. No organomegaly or mass.  EXTREMITIES: No pedal edema, cyanosis, or clubbing. Left hip surgical dressing present NEUROLOGIC: Cranial nerves II through XII are intact. Muscle strength 5/5 in all extremities. Sensation intact. Gait not checked.  PSYCHIATRIC: The patient is alert and oriented x 3.  SKIN: No obvious rash, lesion, or ulcer.   DATA REVIEW:   CBC  Recent Labs  Lab 08/14/19 0434  WBC 13.5*  HGB 10.1*  HCT 34.6*  PLT 216    Chemistries  Recent Labs  Lab 08/13/19 0442  NA 138  K 4.1  CL 99  CO2 30  GLUCOSE 238*  BUN 29*  CREATININE 1.15*  CALCIUM 8.6*    Microbiology Results   Recent Results (from the past 240 hour(s))  SARS Coronavirus 2 by RT PCR (hospital order, performed in [facility] hospital lab) Nasopharyngeal Nasopharyngeal Swab     Status: None   Collection Time: 08/10/19 12:57 PM   Specimen: Nasopharyngeal Swab  Result Value Ref Range Status   SARS Coronavirus 2 NEGATIVE NEGATIVE Final    Comment: (NOTE) If result is NEGATIVE SARS-CoV-2 target nucleic acids are NOT DETECTED. The SARS-CoV-2 RNA is generally detectable in upper and lower  respiratory specimens during the acute phase of infection. The lowest  concentration of SARS-CoV-2 viral copies this assay can detect is 250  copies / mL. A negative result does not preclude SARS-CoV-2 infection  and should not be used as the sole basis for treatment or other  patient management decisions.  A negative result may occur with  improper specimen collection / handling, submission of specimen other  than nasopharyngeal swab, presence of viral mutation(s) within the  areas targeted by  this assay, and inadequate number of viral copies  (<250 copies / mL). A negative result must be combined with clinical  observations, patient history, and epidemiological information. If result is POSITIVE SARS-CoV-2 target nucleic acids are DETECTED. The SARS-CoV-2 RNA is generally detectable in upper and lower  respiratory specimens dur ing the acute phase of infection.  Positive  results are indicative of active infection with SARS-CoV-2.  Clinical  correlation with patient history and other diagnostic information is  necessary to determine patient infection status.  Positive results do  not rule out bacterial infection or co-infection with other viruses. If result is PRESUMPTIVE POSTIVE SARS-CoV-2 nucleic acids MAY BE PRESENT.   A presumptive positive result was obtained on the submitted specimen  and confirmed on repeat testing.  While 2019 novel coronavirus  (SARS-CoV-2) nucleic  acids may be present in the submitted sample  additional confirmatory testing may be necessary for epidemiological  and / or clinical management purposes  to differentiate between  SARS-CoV-2 and other Sarbecovirus currently known to infect humans.  If clinically indicated additional testing with an alternate test  methodology [phone]) is advised. The SARS-CoV-2 RNA is generally  detectable in upper and lower respiratory sp ecimens during the acute  phase of infection. The expected result is Negative. Fact Sheet for Patients:  StrictlyIdeas.no Fact Sheet for Healthcare Providers: BankingDealers.co.za This test is not yet approved or cleared by the [place] FDA and has been authorized for detection and/or diagnosis of SARS-CoV-2 by FDA under an Emergency Use Authorization (EUA).  This EUA will remain in effect (meaning this test can be used) for the duration of the COVID-19 declaration under Section 564(b)(1) of the Act, 21 U.S.C. section  360bbb-3(b)(1), unless the authorization is terminated or revoked sooner. Performed at [facility], [address] [place], [place] [address]   Surgical pcr screen     Status: Abnormal   Collection Time: 08/10/19  5:26 PM   Specimen: Nasal Mucosa; Nasal Swab  Result Value Ref Range Status   MRSA, PCR NEGATIVE NEGATIVE Final   Staphylococcus aureus POSITIVE (A) NEGATIVE Final    Comment: (NOTE) The Xpert SA Assay (FDA approved for NASAL specimens in patients 71 years of age and older), is one component of a comprehensive surveillance program. It is not intended to diagnose infection nor to guide or monitor treatment. Performed at [facility], [address], [place], [place] [address]     RADIOLOGY:  No results found.   CODE STATUS:     Code Status Orders  (From admission, onward)         Start     Ordered   08/10/19 1251  Full code  Continuous     08/10/19 1252        Code Status History    This patient has a current code status but no historical code status.   Advance Care Planning Activity      TOTAL TIME TAKING CARE OF THIS PATIENT: *40* minutes.    Fritzi Mandes M.D on 08/14/2019 at 8:15 AM  Between 7am to 6pm - Pager - [phone] After 6pm go to [email or web address] - password [email or web address] [facility]  Office  [phone]  CC: Primary care physician; System, Pcp Not In

## 2019-08-14 NOTE — Progress Notes (Signed)
Per MD okay for RN to give fleet enema to pt.

## 2019-08-14 NOTE — Progress Notes (Signed)
Subjective: 3 Days Post-Op Procedure(s) (LRB): INTRAMEDULLARY (IM) NAIL INTERTROCHANTRIC with wound vac application (Left) Patient reports pain as 7 on 0-10 scale.  Reports increased pain this AM. Patient is well but is reporting increased pain and burning with urination. Current plan is for d/c to SNF when medically appropriate.  Patient has selected Radiation protection practitioner for discharge. Negative for chest pain and shortness of breath Fever: no Gastrointestinal:Negative for nausea and vomiting  Objective: Vital signs in last 24 hours: Temp:  [97.5 F (36.4 C)-97.8 F (36.6 C)] 97.8 F (36.6 C) (10/26 2330) Pulse Rate:  [52-63] 52 (10/26 2330) Resp:  [17-18] 17 (10/26 2330) BP: (98-124)/(53-63) 112/62 (10/26 2330) SpO2:  [94 %-100 %] 94 % (10/26 2330)  Intake/Output from previous day:  Intake/Output Summary (Last 24 hours) at 08/14/2019 0717 Last data filed at 08/14/2019 0500 Gross per 24 hour  Intake 480 ml  Output 1300 ml  Net -820 ml    Intake/Output this shift: No intake/output data recorded.  Labs: Recent Labs    08/12/19 0635 08/13/19 0442 08/14/19 0434  HGB 8.6* 8.5* 10.1*   Recent Labs    08/13/19 0442 08/14/19 0434  WBC 14.6* 13.5*  RBC 3.43* 4.07  HCT 28.4* 34.6*  PLT 168 216   Recent Labs    08/12/19 0635 08/13/19 0442  NA 135 138  K 5.0 4.1  CL 97* 99  CO2 27 30  BUN 25* 29*  CREATININE 1.27* 1.15*  GLUCOSE 368* 238*  CALCIUM 8.7* 8.6*   No results for input(s): LABPT, INR in the last 72 hours.   EXAM General - Patient is Alert, Appropriate and Oriented Extremity - ABD soft Sensation intact distally Intact pulses distally Dorsiflexion/Plantar flexion intact No cellulitis present Compartment soft Dressing/Incision - Two more distal incisions without any drainage.  Woundvac intact to the most proximal incision without any drainage. Motor Function - intact, moving foot and toes well on exam.   Past Medical History:  Diagnosis Date  .  Arthritis   . CHF (congestive heart failure) ([place])   . COPD (chronic obstructive pulmonary disease) ([place])   . Diabetes mellitus without complication ([place])   . Pulmonary hypertension ([place])   . Tricuspid regurgitation    Assessment/Plan: 3 Days Post-Op Procedure(s) (LRB): INTRAMEDULLARY (IM) NAIL INTERTROCHANTRIC with wound vac application (Left) Active Problems:   Hip fracture (HCC)  Estimated body mass index is 39.32 kg/m as calculated from the following:   Height as of this encounter: 5\' 2"  (1.575 m).   Weight as of this encounter: 97.5 kg. Up with therapy  Labs reviewed this AM, WBC is 13.5, trending down.  No SOB, chest pain or urinary symptoms. Patient is complaining of burning with urination, will check UA. Encouraged incentive spirometer. Hg 10.1 this morning. Patient has had a BM. Continue with PT today, plan for d/c to [facility].  Upon discharge, continue Plavix for DVT prophylaxis. Follow-up with [facility] in 10-14 days for staple removal.  DVT Prophylaxis - TED hose and Plavix 50% weightbearing to the left leg with therapy.  Raquel Qiara Minetti, PA-C [facility] Orthopa[facility] 08/14/2019, 7:17 AM

## 2019-08-20 ENCOUNTER — Other Ambulatory Visit: Payer: Self-pay

## 2019-08-20 ENCOUNTER — Emergency Department: Payer: Medicare Other

## 2019-08-20 ENCOUNTER — Inpatient Hospital Stay
Admission: EM | Admit: 2019-08-20 | Discharge: 2019-08-29 | DRG: 682 | Disposition: A | Discharge status: Skilled Nursing Facility | Payer: Medicare Other | Attending: Internal Medicine | Admitting: Internal Medicine

## 2019-08-20 DIAGNOSIS — E871 Hypo-osmolality and hyponatremia: Secondary | ICD-10-CM | POA: Diagnosis present

## 2019-08-20 DIAGNOSIS — I152 Hypertension secondary to endocrine disorders: Secondary | ICD-10-CM | POA: Diagnosis present

## 2019-08-20 DIAGNOSIS — W1830XD Fall on same level, unspecified, subsequent encounter: Secondary | ICD-10-CM | POA: Diagnosis not present

## 2019-08-20 DIAGNOSIS — S72009A Fracture of unspecified part of neck of unspecified femur, initial encounter for closed fracture: Secondary | ICD-10-CM | POA: Diagnosis present

## 2019-08-20 DIAGNOSIS — Z79890 Hormone replacement therapy (postmenopausal): Secondary | ICD-10-CM

## 2019-08-20 DIAGNOSIS — Z9981 Dependence on supplemental oxygen: Secondary | ICD-10-CM | POA: Diagnosis not present

## 2019-08-20 DIAGNOSIS — E785 Hyperlipidemia, unspecified: Secondary | ICD-10-CM | POA: Diagnosis present

## 2019-08-20 DIAGNOSIS — M797 Fibromyalgia: Secondary | ICD-10-CM | POA: Diagnosis present

## 2019-08-20 DIAGNOSIS — R4182 Altered mental status, unspecified: Secondary | ICD-10-CM | POA: Diagnosis present

## 2019-08-20 DIAGNOSIS — G9341 Metabolic encephalopathy: Secondary | ICD-10-CM | POA: Diagnosis present

## 2019-08-20 DIAGNOSIS — I4891 Unspecified atrial fibrillation: Secondary | ICD-10-CM | POA: Diagnosis not present

## 2019-08-20 DIAGNOSIS — I2510 Atherosclerotic heart disease of native coronary artery without angina pectoris: Secondary | ICD-10-CM | POA: Diagnosis present

## 2019-08-20 DIAGNOSIS — E039 Hypothyroidism, unspecified: Secondary | ICD-10-CM | POA: Diagnosis present

## 2019-08-20 DIAGNOSIS — I340 Nonrheumatic mitral (valve) insufficiency: Secondary | ICD-10-CM | POA: Diagnosis not present

## 2019-08-20 DIAGNOSIS — I5032 Chronic diastolic (congestive) heart failure: Secondary | ICD-10-CM | POA: Diagnosis present

## 2019-08-20 DIAGNOSIS — N1831 Chronic kidney disease, stage 3a: Secondary | ICD-10-CM | POA: Diagnosis present

## 2019-08-20 DIAGNOSIS — I272 Pulmonary hypertension, unspecified: Secondary | ICD-10-CM | POA: Diagnosis present

## 2019-08-20 DIAGNOSIS — Z7902 Long term (current) use of antithrombotics/antiplatelets: Secondary | ICD-10-CM

## 2019-08-20 DIAGNOSIS — I10 Essential (primary) hypertension: Secondary | ICD-10-CM | POA: Diagnosis not present

## 2019-08-20 DIAGNOSIS — E1122 Type 2 diabetes mellitus with diabetic chronic kidney disease: Secondary | ICD-10-CM | POA: Diagnosis present

## 2019-08-20 DIAGNOSIS — D62 Acute posthemorrhagic anemia: Secondary | ICD-10-CM | POA: Diagnosis present

## 2019-08-20 DIAGNOSIS — Z20828 Contact with and (suspected) exposure to other viral communicable diseases: Secondary | ICD-10-CM | POA: Diagnosis present

## 2019-08-20 DIAGNOSIS — R339 Retention of urine, unspecified: Secondary | ICD-10-CM | POA: Diagnosis not present

## 2019-08-20 DIAGNOSIS — J9611 Chronic respiratory failure with hypoxia: Secondary | ICD-10-CM | POA: Diagnosis present

## 2019-08-20 DIAGNOSIS — I4892 Unspecified atrial flutter: Secondary | ICD-10-CM | POA: Diagnosis not present

## 2019-08-20 DIAGNOSIS — I50813 Acute on chronic right heart failure: Secondary | ICD-10-CM

## 2019-08-20 DIAGNOSIS — Z794 Long term (current) use of insulin: Secondary | ICD-10-CM

## 2019-08-20 DIAGNOSIS — N179 Acute kidney failure, unspecified: Secondary | ICD-10-CM | POA: Diagnosis present

## 2019-08-20 DIAGNOSIS — R748 Abnormal levels of other serum enzymes: Secondary | ICD-10-CM | POA: Diagnosis not present

## 2019-08-20 DIAGNOSIS — Z79899 Other long term (current) drug therapy: Secondary | ICD-10-CM | POA: Diagnosis not present

## 2019-08-20 DIAGNOSIS — F419 Anxiety disorder, unspecified: Secondary | ICD-10-CM | POA: Diagnosis present

## 2019-08-20 DIAGNOSIS — E875 Hyperkalemia: Secondary | ICD-10-CM | POA: Diagnosis not present

## 2019-08-20 DIAGNOSIS — M1990 Unspecified osteoarthritis, unspecified site: Secondary | ICD-10-CM | POA: Diagnosis present

## 2019-08-20 DIAGNOSIS — E1159 Type 2 diabetes mellitus with other circulatory complications: Secondary | ICD-10-CM | POA: Diagnosis not present

## 2019-08-20 DIAGNOSIS — I130 Hypertensive heart and chronic kidney disease with heart failure and stage 1 through stage 4 chronic kidney disease, or unspecified chronic kidney disease: Secondary | ICD-10-CM | POA: Diagnosis present

## 2019-08-20 DIAGNOSIS — I959 Hypotension, unspecified: Secondary | ICD-10-CM | POA: Diagnosis present

## 2019-08-20 DIAGNOSIS — Z8249 Family history of ischemic heart disease and other diseases of the circulatory system: Secondary | ICD-10-CM

## 2019-08-20 DIAGNOSIS — D631 Anemia in chronic kidney disease: Secondary | ICD-10-CM | POA: Diagnosis present

## 2019-08-20 DIAGNOSIS — E861 Hypovolemia: Secondary | ICD-10-CM | POA: Diagnosis present

## 2019-08-20 DIAGNOSIS — E1169 Type 2 diabetes mellitus with other specified complication: Secondary | ICD-10-CM | POA: Diagnosis present

## 2019-08-20 DIAGNOSIS — R001 Bradycardia, unspecified: Secondary | ICD-10-CM | POA: Diagnosis present

## 2019-08-20 DIAGNOSIS — Z87891 Personal history of nicotine dependence: Secondary | ICD-10-CM

## 2019-08-20 DIAGNOSIS — Z7982 Long term (current) use of aspirin: Secondary | ICD-10-CM

## 2019-08-20 DIAGNOSIS — J449 Chronic obstructive pulmonary disease, unspecified: Secondary | ICD-10-CM | POA: Diagnosis present

## 2019-08-20 DIAGNOSIS — I071 Rheumatic tricuspid insufficiency: Secondary | ICD-10-CM | POA: Diagnosis present

## 2019-08-20 DIAGNOSIS — S72142D Displaced intertrochanteric fracture of left femur, subsequent encounter for closed fracture with routine healing: Secondary | ICD-10-CM

## 2019-08-20 DIAGNOSIS — E1129 Type 2 diabetes mellitus with other diabetic kidney complication: Secondary | ICD-10-CM | POA: Diagnosis present

## 2019-08-20 DIAGNOSIS — I361 Nonrheumatic tricuspid (valve) insufficiency: Secondary | ICD-10-CM | POA: Diagnosis not present

## 2019-08-20 DIAGNOSIS — I5082 Biventricular heart failure: Secondary | ICD-10-CM | POA: Diagnosis present

## 2019-08-20 DIAGNOSIS — Z885 Allergy status to narcotic agent status: Secondary | ICD-10-CM

## 2019-08-20 DIAGNOSIS — R7401 Elevation of levels of liver transaminase levels: Secondary | ICD-10-CM | POA: Diagnosis not present

## 2019-08-20 DIAGNOSIS — Z888 Allergy status to other drugs, medicaments and biological substances status: Secondary | ICD-10-CM

## 2019-08-20 DIAGNOSIS — D72829 Elevated white blood cell count, unspecified: Secondary | ICD-10-CM | POA: Diagnosis present

## 2019-08-20 LAB — COMPREHENSIVE METABOLIC PANEL
ALT: 424 U/L — ABNORMAL HIGH (ref 0–44)
AST: 233 U/L — ABNORMAL HIGH (ref 15–41)
Albumin: 2.8 g/dL — ABNORMAL LOW (ref 3.5–5.0)
Alkaline Phosphatase: 160 U/L — ABNORMAL HIGH (ref 38–126)
Anion gap: 12 (ref 5–15)
BUN: 97 mg/dL — ABNORMAL HIGH (ref 8–23)
CO2: 24 mmol/L (ref 22–32)
Calcium: 8.3 mg/dL — ABNORMAL LOW (ref 8.9–10.3)
Chloride: 90 mmol/L — ABNORMAL LOW (ref 98–111)
Creatinine, Ser: 5.21 mg/dL — ABNORMAL HIGH (ref 0.44–1.00)
GFR calc Af Amer: 8 mL/min — ABNORMAL LOW (ref >60)
GFR calc non Af Amer: 7 mL/min — ABNORMAL LOW (ref >60)
Glucose, Bld: 116 mg/dL — ABNORMAL HIGH (ref 70–99)
Potassium: 7 mmol/L (ref 3.5–5.1)
Sodium: 126 mmol/L — ABNORMAL LOW (ref 135–145)
Total Bilirubin: 1.8 mg/dL — ABNORMAL HIGH (ref 0.3–1.2)
Total Protein: 5.7 g/dL — ABNORMAL LOW (ref 6.5–8.1)

## 2019-08-20 LAB — CBC WITH DIFFERENTIAL/PLATELET
Abs Immature Granulocytes: 0.31 10*3/uL — ABNORMAL HIGH (ref 0.00–0.07)
Basophils Absolute: 0.1 10*3/uL (ref 0.0–0.1)
Basophils Relative: 0 %
Eosinophils Absolute: 0.3 10*3/uL (ref 0.0–0.5)
Eosinophils Relative: 2 %
HCT: 31.2 % — ABNORMAL LOW (ref 36.0–46.0)
Hemoglobin: 9.6 g/dL — ABNORMAL LOW (ref 12.0–15.0)
Immature Granulocytes: 2 %
Lymphocytes Relative: 14 %
Lymphs Abs: 2.2 10*3/uL (ref 0.7–4.0)
MCH: 25.4 pg — ABNORMAL LOW (ref 26.0–34.0)
MCHC: 30.8 g/dL (ref 30.0–36.0)
MCV: 82.5 fL (ref 80.0–100.0)
Monocytes Absolute: 1.3 10*3/uL — ABNORMAL HIGH (ref 0.1–1.0)
Monocytes Relative: 8 %
Neutro Abs: 12.2 10*3/uL — ABNORMAL HIGH (ref 1.7–7.7)
Neutrophils Relative %: 74 %
Platelets: 265 10*3/uL (ref 150–400)
RBC: 3.78 MIL/uL — ABNORMAL LOW (ref 3.87–5.11)
RDW: 19.3 % — ABNORMAL HIGH (ref 11.5–15.5)
WBC: 16.3 10*3/uL — ABNORMAL HIGH (ref 4.0–10.5)
nRBC: 0.7 % — ABNORMAL HIGH (ref 0.0–0.2)

## 2019-08-20 LAB — TROPONIN I (HIGH SENSITIVITY)
Troponin I (High Sensitivity): 110 ng/L (ref <18)
Troponin I (High Sensitivity): 85 ng/L — ABNORMAL HIGH (ref <18)

## 2019-08-20 LAB — URINALYSIS, COMPLETE (UACMP) WITH MICROSCOPIC
Bacteria, UA: NONE SEEN
Bilirubin Urine: NEGATIVE
Glucose, UA: NEGATIVE mg/dL
Hgb urine dipstick: NEGATIVE
Ketones, ur: NEGATIVE mg/dL
Leukocytes,Ua: NEGATIVE
Nitrite: NEGATIVE
Protein, ur: NEGATIVE mg/dL
Specific Gravity, Urine: 1.013 (ref 1.005–1.030)
Squamous Epithelial / HPF: NONE SEEN (ref 0–5)
pH: 5 (ref 5.0–8.0)

## 2019-08-20 LAB — RENAL FUNCTION PANEL
Albumin: 2.6 g/dL — ABNORMAL LOW (ref 3.5–5.0)
Anion gap: 14 (ref 5–15)
BUN: 94 mg/dL — ABNORMAL HIGH (ref 8–23)
CO2: 23 mmol/L (ref 22–32)
Calcium: 8.4 mg/dL — ABNORMAL LOW (ref 8.9–10.3)
Chloride: 91 mmol/L — ABNORMAL LOW (ref 98–111)
Creatinine, Ser: 4.56 mg/dL — ABNORMAL HIGH (ref 0.44–1.00)
GFR calc Af Amer: 10 mL/min — ABNORMAL LOW (ref >60)
GFR calc non Af Amer: 9 mL/min — ABNORMAL LOW (ref >60)
Glucose, Bld: 170 mg/dL — ABNORMAL HIGH (ref 70–99)
Phosphorus: 6.1 mg/dL — ABNORMAL HIGH (ref 2.5–4.6)
Potassium: 5.2 mmol/L — ABNORMAL HIGH (ref 3.5–5.1)
Sodium: 128 mmol/L — ABNORMAL LOW (ref 135–145)

## 2019-08-20 LAB — CREATININE, URINE, RANDOM: Creatinine, Urine: 116 mg/dL

## 2019-08-20 LAB — GLUCOSE, CAPILLARY: Glucose-Capillary: 135 mg/dL — ABNORMAL HIGH (ref 70–99)

## 2019-08-20 LAB — MAGNESIUM: Magnesium: 3.2 mg/dL — ABNORMAL HIGH (ref 1.7–2.4)

## 2019-08-20 LAB — SARS CORONAVIRUS 2 BY RT PCR (HOSPITAL ORDER, PERFORMED IN ~~LOC~~ HOSPITAL LAB): SARS Coronavirus 2: NEGATIVE

## 2019-08-20 LAB — OSMOLALITY, URINE: Osmolality, Ur: 305 mOsm/kg (ref 300–900)

## 2019-08-20 LAB — BRAIN NATRIURETIC PEPTIDE: B Natriuretic Peptide: 280 pg/mL — ABNORMAL HIGH (ref 0.0–100.0)

## 2019-08-20 LAB — TSH: TSH: 3.708 u[IU]/mL (ref 0.350–4.500)

## 2019-08-20 LAB — LACTIC ACID, PLASMA: Lactic Acid, Venous: 1.3 mmol/L (ref 0.5–1.9)

## 2019-08-20 LAB — OSMOLALITY: Osmolality: 309 mOsm/kg — ABNORMAL HIGH (ref 275–295)

## 2019-08-20 LAB — SODIUM, URINE, RANDOM: Sodium, Ur: <10 mmol/L

## 2019-08-20 MED ORDER — INSULIN ASPART 100 UNIT/ML IV SOLN
5.0000 [IU] | Freq: Once | INTRAVENOUS | Status: AC
  Administered 2019-08-20: 5 [IU] via INTRAVENOUS
  Filled 2019-08-20: qty 0.05

## 2019-08-20 MED ORDER — SODIUM CHLORIDE 0.9 % IV SOLN
INTRAVENOUS | Status: DC
  Administered 2019-08-20: 23:00:00 via INTRAVENOUS

## 2019-08-20 MED ORDER — HEPARIN SODIUM (PORCINE) 5000 UNIT/ML IJ SOLN
5000.0000 [IU] | Freq: Three times a day (TID) | INTRAMUSCULAR | Status: DC
  Administered 2019-08-20 – 2019-08-24 (×9): 5000 [IU] via SUBCUTANEOUS
  Filled 2019-08-20 (×10): qty 1

## 2019-08-20 MED ORDER — FLUTICASONE-UMECLIDIN-VILANT 100-62.5-25 MCG/INH IN AEPB: 1.0000 | INHALATION_SPRAY | Freq: Every day | RESPIRATORY_TRACT | Status: DC

## 2019-08-20 MED ORDER — INSULIN DETEMIR 100 UNIT/ML ~~LOC~~ SOLN
10.0000 [IU] | Freq: Every day | SUBCUTANEOUS | Status: DC
  Administered 2019-08-21: 10 [IU] via SUBCUTANEOUS
  Filled 2019-08-20 (×4): qty 0.1

## 2019-08-20 MED ORDER — CALCIUM GLUCONATE 10 % IV SOLN
1.0000 g | Freq: Once | INTRAVENOUS | Status: AC
  Administered 2019-08-20: 1 g via INTRAVENOUS
  Filled 2019-08-20: qty 10

## 2019-08-20 MED ORDER — DIPHENHYDRAMINE HCL 50 MG/ML IJ SOLN
25.0000 mg | Freq: Once | INTRAMUSCULAR | Status: AC
  Administered 2019-08-20: 25 mg via INTRAVENOUS
  Filled 2019-08-20: qty 1

## 2019-08-20 MED ORDER — ACETAMINOPHEN 650 MG RE SUPP
650.0000 mg | Freq: Four times a day (QID) | RECTAL | Status: DC | PRN
  Filled 2019-08-20: qty 1

## 2019-08-20 MED ORDER — SODIUM ZIRCONIUM CYCLOSILICATE 10 G PO PACK
10.0000 g | PACK | Freq: Three times a day (TID) | ORAL | Status: DC
  Administered 2019-08-20: 10 g via ORAL
  Filled 2019-08-20 (×2): qty 1

## 2019-08-20 MED ORDER — INSULIN ASPART 100 UNIT/ML ~~LOC~~ SOLN
0.0000 [IU] | Freq: Three times a day (TID) | SUBCUTANEOUS | Status: DC
  Administered 2019-08-21: 17:00:00 3 [IU] via SUBCUTANEOUS
  Administered 2019-08-21: 1 [IU] via SUBCUTANEOUS
  Administered 2019-08-21: 2 [IU] via SUBCUTANEOUS
  Administered 2019-08-22: 3 [IU] via SUBCUTANEOUS
  Administered 2019-08-22: 5 [IU] via SUBCUTANEOUS
  Administered 2019-08-22: 3 [IU] via SUBCUTANEOUS
  Administered 2019-08-23 (×3): 2 [IU] via SUBCUTANEOUS
  Administered 2019-08-24 (×2): 1 [IU] via SUBCUTANEOUS
  Administered 2019-08-25 – 2019-08-26 (×4): 2 [IU] via SUBCUTANEOUS
  Administered 2019-08-26: 1 [IU] via SUBCUTANEOUS
  Administered 2019-08-27 (×2): 5 [IU] via SUBCUTANEOUS
  Administered 2019-08-27: 1 [IU] via SUBCUTANEOUS
  Administered 2019-08-28 (×2): 2 [IU] via SUBCUTANEOUS
  Administered 2019-08-28: 5 [IU] via SUBCUTANEOUS
  Administered 2019-08-29: 7 [IU] via SUBCUTANEOUS
  Administered 2019-08-29: 1 [IU] via SUBCUTANEOUS
  Filled 2019-08-20 (×24): qty 1

## 2019-08-20 MED ORDER — VANCOMYCIN HCL 10 G IV SOLR
2000.0000 mg | Freq: Once | INTRAVENOUS | Status: DC
  Filled 2019-08-20: qty 2000

## 2019-08-20 MED ORDER — ONDANSETRON HCL 4 MG PO TABS
4.0000 mg | ORAL_TABLET | Freq: Four times a day (QID) | ORAL | Status: DC | PRN
  Filled 2019-08-20: qty 1

## 2019-08-20 MED ORDER — ASPIRIN 325 MG PO TABS
325.0000 mg | ORAL_TABLET | Freq: Two times a day (BID) | ORAL | Status: DC
  Filled 2019-08-20: qty 1

## 2019-08-20 MED ORDER — PROCHLORPERAZINE EDISYLATE 10 MG/2ML IJ SOLN
10.0000 mg | Freq: Once | INTRAMUSCULAR | Status: AC
  Administered 2019-08-20: 10 mg via INTRAVENOUS
  Filled 2019-08-20: qty 2

## 2019-08-20 MED ORDER — SODIUM CHLORIDE 0.9% FLUSH
3.0000 mL | Freq: Two times a day (BID) | INTRAVENOUS | Status: DC
  Administered 2019-08-20 – 2019-08-29 (×15): 3 mL via INTRAVENOUS

## 2019-08-20 MED ORDER — CLOPIDOGREL BISULFATE 75 MG PO TABS
75.0000 mg | ORAL_TABLET | Freq: Every day | ORAL | Status: DC
  Administered 2019-08-21 – 2019-08-24 (×4): 75 mg via ORAL
  Filled 2019-08-20 (×4): qty 1

## 2019-08-20 MED ORDER — ACETAMINOPHEN 325 MG PO TABS
650.0000 mg | ORAL_TABLET | Freq: Four times a day (QID) | ORAL | Status: DC | PRN
  Administered 2019-08-22 – 2019-08-23 (×4): 650 mg via ORAL
  Filled 2019-08-20 (×4): qty 2

## 2019-08-20 MED ORDER — ISOSORBIDE MONONITRATE ER 30 MG PO TB24
30.0000 mg | ORAL_TABLET | Freq: Two times a day (BID) | ORAL | Status: DC
  Administered 2019-08-20 – 2019-08-29 (×18): 30 mg via ORAL
  Filled 2019-08-20 (×19): qty 1

## 2019-08-20 MED ORDER — VANCOMYCIN HCL IN DEXTROSE 1-5 GM/200ML-% IV SOLN: 1000.0000 mg | Freq: Once | INTRAVENOUS | Status: DC

## 2019-08-20 MED ORDER — METRONIDAZOLE IN NACL 5-0.79 MG/ML-% IV SOLN
500.0000 mg | Freq: Once | INTRAVENOUS | Status: AC
  Administered 2019-08-20: 500 mg via INTRAVENOUS
  Filled 2019-08-20: qty 100

## 2019-08-20 MED ORDER — LACTATED RINGERS IV BOLUS
1000.0000 mL | Freq: Once | INTRAVENOUS | Status: AC
  Administered 2019-08-20: 1000 mL via INTRAVENOUS

## 2019-08-20 MED ORDER — SODIUM CHLORIDE 0.9 % IV SOLN
2.0000 g | Freq: Once | INTRAVENOUS | Status: DC
  Filled 2019-08-20: qty 20

## 2019-08-20 MED ORDER — LEVOTHYROXINE SODIUM 25 MCG PO TABS
125.0000 ug | ORAL_TABLET | Freq: Every day | ORAL | Status: DC
  Administered 2019-08-21 – 2019-08-29 (×8): 125 ug via ORAL
  Filled 2019-08-20 (×4): qty 1
  Filled 2019-08-20: qty 3
  Filled 2019-08-20 (×3): qty 1

## 2019-08-20 MED ORDER — UMECLIDINIUM-VILANTEROL 62.5-25 MCG/INH IN AEPB
1.0000 | INHALATION_SPRAY | Freq: Every day | RESPIRATORY_TRACT | Status: DC
  Administered 2019-08-23 – 2019-08-29 (×7): 1 via RESPIRATORY_TRACT
  Filled 2019-08-20 (×2): qty 14

## 2019-08-20 MED ORDER — SIMVASTATIN 20 MG PO TABS
20.0000 mg | ORAL_TABLET | Freq: Every day | ORAL | Status: DC
  Administered 2019-08-20: 20 mg via ORAL
  Filled 2019-08-20: qty 1
  Filled 2019-08-20: qty 2

## 2019-08-20 MED ORDER — SODIUM CHLORIDE 0.9 % IV SOLN
2.0000 g | Freq: Once | INTRAVENOUS | Status: AC
  Administered 2019-08-20: 2 g via INTRAVENOUS
  Filled 2019-08-20: qty 2

## 2019-08-20 MED ORDER — ALBUTEROL SULFATE (2.5 MG/3ML) 0.083% IN NEBU: 2.5000 mg | INHALATION_SOLUTION | Freq: Four times a day (QID) | RESPIRATORY_TRACT | Status: DC | PRN

## 2019-08-20 MED ORDER — ALBUTEROL SULFATE (2.5 MG/3ML) 0.083% IN NEBU
10.0000 mg | INHALATION_SOLUTION | Freq: Once | RESPIRATORY_TRACT | Status: AC
  Administered 2019-08-20: 10 mg via RESPIRATORY_TRACT
  Filled 2019-08-20: qty 12

## 2019-08-20 MED ORDER — ONDANSETRON HCL 4 MG/2ML IJ SOLN: 4.0000 mg | Freq: Four times a day (QID) | INTRAMUSCULAR | Status: DC | PRN

## 2019-08-20 MED ORDER — DEXTROSE 50 % IV SOLN
1.0000 | Freq: Once | INTRAVENOUS | Status: AC
  Administered 2019-08-20: 50 mL via INTRAVENOUS
  Filled 2019-08-20: qty 50

## 2019-08-20 MED ORDER — SODIUM BICARBONATE 8.4 % IV SOLN
50.0000 meq | Freq: Once | INTRAVENOUS | Status: AC
  Administered 2019-08-20: 50 meq via INTRAVENOUS
  Filled 2019-08-20: qty 50

## 2019-08-20 NOTE — ED Notes (Signed)
MD states he is okay with only one set of cultures being obtained due to multiple failed attempts to collect blood sample.

## 2019-08-20 NOTE — ED Triage Notes (Signed)
Pt arrives via [facility] from [facility] for weakness, AMS and abnormal kidney lab results. PT is A&Ox4, c/o pain in the left hip. PT has dressing to the left hip and honeycomb dressing to the left knee area.

## 2019-08-20 NOTE — ED Notes (Signed)
Pt given bedpan but was unable to urinate

## 2019-08-20 NOTE — ED Notes (Signed)
Pt otf for imaging 

## 2019-08-20 NOTE — ED Provider Notes (Addendum)
[facility] Emergency Department Provider Note   ____________________________________________   First MD Initiated Contact with Patient 08/20/19 1530     (approximate)  I have reviewed the triage vital signs and the nursing notes.   HISTORY  Chief Complaint Altered Mental Status   HPI Destiny Zuniga is a 78 y.o. female with past medical history of COPD on 3 L nasal cannula, CHF, diabetes, and pulmonary hypertension who presents to the ED for altered mental status.  Patient currently resides at rehab facility following admission for hip fracture.  She reportedly has been increasingly weak and not acting like herself, with slurred speech.  Labs were checked at the facility today and were significant for elevated BUN and creatinine.  Patient reports that she feels malaised and "not well", but denies any specific areas of pain or weakness.  She denies any fevers, cough, chest pain, shortness of breath, dysuria, or hematuria.        Past Medical History:  Diagnosis Date   Arthritis    CHF (congestive heart failure) (HCC)    COPD (chronic obstructive pulmonary disease) (HCC)    Diabetes mellitus without complication (HCC)    Pulmonary hypertension (HCC)    Tricuspid regurgitation     Patient Active Problem List   Diagnosis Date Noted   Acute renal failure (ARF) (HCC) 08/20/2019   COPD (chronic obstructive pulmonary disease) (HCC)    Diabetes (HCC)    Hyperlipidemia    Hyperkalemia    Hyponatremia    Bradycardia    Coronary artery disease    Chronic diastolic CHF (congestive heart failure) (HCC)    Hypothyroidism    Hypertension associated with diabetes (HCC)    Hip fracture (HCC) 08/10/2019    Past Surgical History:  Procedure Laterality Date   CARDIAC SURGERY     INTRAMEDULLARY (IM) NAIL INTERTROCHANTERIC Left 08/11/2019   Procedure: INTRAMEDULLARY (IM) NAIL INTERTROCHANTRIC with wound vac application;  Surgeon:  Kennedy Bucker, MD;  Location: [facility] ORS;  Service: Orthopedics;  Laterality: Left;    Prior to Admission medications   Medication Sig Start Date End Date Taking? Authorizing Provider  albuterol (VENTOLIN HFA) 108 (90 Base) MCG/ACT inhaler Inhale 2 puffs into the lungs every 6 (six) hours as needed for wheezing or shortness of breath. 05/15/19  Yes [provider]  aspirin 325 MG tablet Take 325 mg by mouth 2 (two) times daily.   Yes [provider]  carvedilol (COREG) 3.125 MG tablet Take 3.125 mg by mouth 2 (two) times daily. 12/25/18 12/25/19 Yes [provider]  cetirizine (ZYRTEC) 10 MG tablet Take 10 mg by mouth daily as needed for allergies.    Yes [provider]  cholecalciferol (VITAMIN D3) 25 MCG (1000 UT) tablet Take 2,000 Units by mouth daily.   Yes [provider]  clopidogrel (PLAVIX) 75 MG tablet Take 75 mg by mouth daily. 02/10/19  Yes [provider]  diclofenac sodium (VOLTAREN) 1 % GEL Apply 4 g topically 3 (three) times daily as needed (joint pain). (apply to hands and knees) 04/04/19  Yes [provider]  ferrous fumarate-b12-vitamic C-folic acid (TRINSICON / FOLTRIN) capsule Take 1 capsule by mouth 2 (two) times daily after a meal. 08/14/19  Yes Enedina Finner, MD  fluticasone (FLONASE) 50 MCG/ACT nasal spray Place 2 sprays into both nostrils daily. 12/06/18  Yes [provider]  Fluticasone-Umeclidin-Vilant (TRELEGY ELLIPTA) 100-62.5-25 MCG/INH AEPB Inhale 1 puff into the lungs daily.   Yes [provider]  furosemide (LASIX) 40 MG tablet Take 40 mg by mouth 2 (two) times daily.  01/08/19  Yes [provider]  gabapentin (NEURONTIN) 600 MG tablet Take 1,200-1,800 mg by mouth See admin instructions. Take 2 tablets (1200mg ) by mouth every morning and take 3 tablets (1800mg ) by mouth every night 11/14/18  Yes [provider]  insulin aspart (NOVOLOG) 100 UNIT/ML injection Inject 20 Units into the  skin 3 (three) times daily with meals.  03/23/19  Yes [provider]  Insulin Detemir (LEVEMIR) 100 UNIT/ML Pen Inject 25 Units into the skin daily with supper. 07/12/19  Yes [provider]  isosorbide mononitrate (IMDUR) 30 MG 24 hr tablet Take 30 mg by mouth 2 (two) times daily.  03/06/19  Yes [provider]  levothyroxine (SYNTHROID) 125 MCG tablet Take 125 mcg by mouth daily. 02/19/19  Yes [provider]  losartan (COZAAR) 100 MG tablet Take 100 mg by mouth daily. 12/15/18  Yes [provider]  nitroGLYCERIN (NITROLINGUAL) 0.4 MG/SPRAY spray Place 1 spray under the tongue every 5 (five) minutes as needed for chest pain. 12/20/18  Yes [provider]  omeprazole (PRILOSEC) 20 MG capsule Take 20 mg by mouth daily. 12/15/18  Yes [provider]  ondansetron (ZOFRAN) 4 MG tablet Take 4 mg by mouth daily as needed for nausea or vomiting.   Yes [provider]  oxyCODONE (ROXICODONE) 5 MG immediate release tablet Take 1-2 tablets (5-10 mg total) by mouth every 4 (four) hours as needed for severe pain. 08/14/19  Yes Anson OregonMcGhee, James Lance, PA-C  potassium chloride (KLOR-CON) 10 MEQ tablet Take 10 mEq by mouth daily. 04/03/19  Yes [provider]  simvastatin (ZOCOR) 20 MG tablet Take 20 mg by mouth daily. 01/21/19  Yes [provider]  traMADol (ULTRAM) 50 MG tablet Take 1 tablet (50 mg total) by mouth every 6 (six) hours as needed for moderate pain. 08/14/19  Yes Enedina FinnerPatel, Sona, MD    Allergies Codeine, Midazolam, Celecoxib, and Lisinopril  Family History  Problem Relation Age of Onset   Heart disease Mother     Social History Social History   Tobacco Use   Smoking status: Former Smoker   Smokeless tobacco: Never Used  Substance Use Topics   Alcohol use: Not Currently   Drug use: Never    Review of Systems  Constitutional: No fever/chills.  Positive for generalized weakness and malaise. Eyes: No visual  changes. ENT: No sore throat. Cardiovascular: Denies chest pain. Respiratory: Denies shortness of breath. Gastrointestinal: No abdominal pain.  No nausea, no vomiting.  No diarrhea.  No constipation. Genitourinary: Negative for dysuria. Musculoskeletal: Negative for back pain. Skin: Negative for rash. Neurological: Negative for headaches, focal weakness or numbness.  ____________________________________________   PHYSICAL EXAM:  VITAL SIGNS: ED Triage Vitals  Enc Vitals Group     BP      Pulse      Resp      Temp      Temp src      SpO2      Weight      Height      Head Circumference      Peak Flow      Pain Score      Pain Loc      Pain Edu?      Excl. in GC?     Constitutional: Alert and oriented. Eyes: Conjunctivae are normal. Head: Atraumatic. Nose: No congestion/rhinnorhea. Mouth/Throat: Mucous membranes are dry. Neck: Normal ROM  Cardiovascular: Normal rate, regular rhythm. Grossly normal heart sounds. Respiratory: Normal respiratory effort.  No retractions. Lungs CTAB. Gastrointestinal: Soft and nontender. No distention. Genitourinary: deferred Musculoskeletal: No lower extremity tenderness nor edema.  Left lateral hip surgical sites tender to palpation with purulent and foul-smelling drainage, staples intact. Neurologic: Occasional slurred speech with no apparent word finding difficulties, cranial nerves otherwise intact. No gross focal neurologic deficits are appreciated, 5-5 strength in bilateral upper and lower extremities, no pronator drift. Skin:  Skin is warm, dry and intact. No rash noted. Psychiatric: Mood and affect are normal. Speech and behavior are normal.  ____________________________________________   LABS (all labs ordered are listed, but only abnormal results are displayed)  Labs Reviewed  CBC WITH DIFFERENTIAL/PLATELET - Abnormal; Notable for the following components:      Result Value   WBC 16.3 (*)    RBC 3.78 (*)    Hemoglobin 9.6  (*)    HCT 31.2 (*)    MCH 25.4 (*)    RDW 19.3 (*)    nRBC 0.7 (*)    Neutro Abs 12.2 (*)    Monocytes Absolute 1.3 (*)    Abs Immature Granulocytes 0.31 (*)    All other components within normal limits  COMPREHENSIVE METABOLIC PANEL - Abnormal; Notable for the following components:   Sodium 126 (*)    Potassium 7.0 (*)    Chloride 90 (*)    Glucose, Bld 116 (*)    BUN 97 (*)    Creatinine, Ser 5.21 (*)    Calcium 8.3 (*)    Total Protein 5.7 (*)    Albumin 2.8 (*)    AST 233 (*)    ALT 424 (*)    Alkaline Phosphatase 160 (*)    Total Bilirubin 1.8 (*)    GFR calc non Af Amer 7 (*)    GFR calc Af Amer 8 (*)    All other components within normal limits  MAGNESIUM - Abnormal; Notable for the following components:   Magnesium 3.2 (*)    All other components within normal limits  URINALYSIS, COMPLETE (UACMP) WITH MICROSCOPIC - Abnormal; Notable for the following components:   Color, Urine YELLOW (*)    APPearance CLEAR (*)    All other components within normal limits  BRAIN NATRIURETIC PEPTIDE - Abnormal; Notable for the following components:   B Natriuretic Peptide 280.0 (*)    All other components within normal limits  TROPONIN I (HIGH SENSITIVITY) - Abnormal; Notable for the following components:   Troponin I (High Sensitivity) 110 (*)    All other components within normal limits  SARS CORONAVIRUS 2 BY RT PCR (HOSPITAL ORDER, PERFORMED IN [facility])  CULTURE, BLOOD (ROUTINE X 2)  CULTURE, BLOOD (ROUTINE X 2)  URINE CULTURE  LACTIC ACID, PLASMA  SODIUM, URINE, RANDOM  CREATININE, URINE, RANDOM  OSMOLALITY, URINE  TSH  LACTIC ACID, PLASMA  HEPATIC FUNCTION PANEL  CBC  RENAL FUNCTION PANEL  RENAL FUNCTION PANEL  OSMOLALITY  TROPONIN I (HIGH SENSITIVITY)   ____________________________________________  EKG  ED ECG REPORT I, Chesley Noon, the attending physician, personally viewed and interpreted this ECG.   Date: 08/20/2019  EKG Time:  15:57  Rate: 42  Rhythm: sinus bradycardia  Axis: Normal  Intervals:none  ST&T Change: Nones    PROCEDURES  Procedure(s) performed (including Critical Care):  .Critical Care Performed by: Chesley Noon, MD Authorized by: Chesley Noon, MD   Critical care provider statement:    Critical care time (  minutes):  45   Critical care time was exclusive of:  Separately billable procedures and treating other patients and teaching time   Critical care was necessary to treat or prevent imminent or life-threatening deterioration of the following conditions:  Renal failure   Critical care was time spent personally by me on the following activities:  Discussions with consultants, evaluation of patient's response to treatment, examination of patient, ordering and performing treatments and interventions, ordering and review of laboratory studies, ordering and review of radiographic studies, pulse oximetry, re-evaluation of patient's condition, obtaining history from patient or surrogate and review of old charts   I assumed direction of critical care for this patient from another provider in my specialty: no       ____________________________________________   INITIAL IMPRESSION / ASSESSMENT AND PLAN / ED COURSE       78 year old female presents to the ED for increasing weakness and malaise, noted to be not acting like her usual self at rehab facility and had labs significant for elevated BUN and creatinine.  Reviewed lab work from rehab facility, where BUN was 92 and creatinine greater than 4.  Prior in our system shows creatinine just above 1.  Would have high suspicion for dehydration in this patient, will give fluid bolus.  Also plan to check for other electrolyte abnormality or source of infection.  Patient denies any infectious symptoms, will screen chest x-ray and UA.  She is afebrile here with stable vitals, doubt sepsis.  She has no focal neurologic deficits on exam, doubt intracranial  process and suspect her changes in mental status are related to uremia.  Once patient placed in room and further evaluation of surgical sites is possible, proximal sites to her left lateral hip have purulent appearing drainage.  Will draw blood cultures and lactate, continue IV fluid bolus and start on antibiotics.  Labs significant for hyperkalemia, likely the etiology of patient's bradycardia.  Bradycardia improved after she was given calcium, patient also given high-dose albuterol, insulin, D50, and bicarb.  Case discussed with nephrology, who recommends giving dose of Lokelma and then recheck of BMP 2 hours afterwards.  Case was also discussed with orthopedics, who states wounds at her left hip do not appear acutely infected.  Given her renal failure, benefits of vancomycin seem to be outweighed by risks.  We will continue cefepime and Flagyl.  Case discussed with hospitalist, who accepts patient for admission.      ____________________________________________   FINAL CLINICAL IMPRESSION(S) / ED DIAGNOSES  Final diagnoses:  Acute renal failure, unspecified acute renal failure type (HCC)  Hyperkalemia  Altered mental status, unspecified altered mental status type     ED Discharge Orders    None       Note:  This document was prepared using Dragon voice recognition software and may include unintentional dictation errors.   Blake Divine, MD 08/20/19 2142    Blake Divine, MD 08/20/19 2142

## 2019-08-20 NOTE — Consult Note (Signed)
PHARMACY -  BRIEF ANTIBIOTIC NOTE   Pharmacy has received consult(s) for vancomycin from an ED provider. Patient also ordered ceftriaxone 2 g IV x 1.  The patient's profile has been reviewed for ht/wt/allergies/indication/available labs.    One time order(s) placed for  -vancomycin LD 2 g IV x 1  Further antibiotics/pharmacy consults should be ordered by admitting physician if indicated.                       Thank you, Benita Gutter 08/20/2019  4:47 PM

## 2019-08-20 NOTE — Consult Note (Signed)
PHARMACY -  BRIEF ANTIBIOTIC NOTE   Pharmacy has received consult(s) for cefepime from an ED provider. Patient also ordered ceftriaxone 2 g IV x 1.  The patient's profile has been reviewed for ht/wt/allergies/indication/available labs.    One time order(s) placed for  Cefepime 2 g x 1.   Further antibiotics/pharmacy consults should be ordered by admitting physician if indicated.                       Thank you, Oswald Hillock 08/20/2019  5:54 PM

## 2019-08-20 NOTE — Consult Note (Signed)
Patient is 89 status post ORIF of a comminuted reverse obliquity subtroches fracture treated with IM rod.  She came in today not doing well with fatigue and confusion.  She had a wound VAC applied postop over the proximal incision and has developed blisters around that wound VAC after it was well it was there and after to remove these are draining serosanguineous fluid her proximal incision also appears to be draining some slight serosanguineous fluid but nothing purulent.  There is minimal erythema and I do not think there is a deep infection present.  Distal incision has essentially no drainage.  We will continue to follow her and asked that she have a ABD dressing applied to her current wounds and will continue to follow that while she is here.

## 2019-08-20 NOTE — ED Notes (Signed)
Dr Jessup at bedside 

## 2019-08-20 NOTE — ED Notes (Signed)
Pt mumbling at times, speaking about random things, pt not making sense majority of the time

## 2019-08-20 NOTE — H&P (Signed)
History and Physical    Destiny Zuniga FKC:[id number] DOB: 07/09/1941 DOA: 08/20/2019  PCP: Kirk Ruths, MD  Patient coming from: [place]  I have personally briefly reviewed patient's old medical records in [place]  Chief Complaint: Altered mental status  HPI: Destiny Zuniga is a 78 y.o. female with medical history significant for COPD with chronic respiratory failure on 3 L supplemental O2 via [place], chronic diastolic CHF, moderate-severe pulmonary hypertension, CAD s/p PCI, insulin-dependent diabetes, hypertension, hypothyroidism, hyperlipidemia, and recent admission for left hip fracture s/p repair on 08/11/2019 who presented from her skilled nursing facility for evaluation of altered mental status.  History limited from patient as she does not recall events from the day therefore history is otherwise obtained from EDP and chart review.  Patient recently admitted from 08/10/2019-08/14/2019 for left hip fracture s/p ORIF on 08/11/2019.  She was discharged to skilled nursing facility on aspirin and Plavix for VTE prophylaxis.  Patient was noted to have altered mental status today on her skilled nursing facility.  She also reportedly had abnormal kidney function findings on lab work.  She was therefore sent to the ED for further evaluation.  Currently patient is awake, alert, and oriented but states she does not really recall what was going on at her facility.  She reports having a headache and some pain and decreased range of motion at her left hip due to recent hip fracture.  She denies any dysuria, diarrhea, nausea, vomiting, chest pain, abdominal pain.  She denies any NSAID use.  ED Course:  Initial vitals showed BP 119/56, pulse 74, RR 18, temp 97.9 Fahrenheit, SPO2 100% on 3 L supplemental O2 via [place].  Labs significant for potassium 7.0, BUN 97, creatinine 5.21 (1.15 on 08/13/2019), sodium 126, chloride 90, bicarb 24, serum glucose 116, AST 233,  ALT 424, alk phos 160, total bilirubin 1.8, magnesium 3.2, WBC 16.3, hemoglobin 9.6, platelets 265,000, high-sensitivity troponin I 110.  Urinalysis and blood cultures were obtained and pending.  SARS-CoV-2 test was obtained and pending.  Portable chest x-ray was negative for acute cardiopulmonary process.  Patient was started on empiric antibiotics with IV Flagyl and cefepime.  She was given IV calcium gluconate, 1 amp sodium bicarb, NovoLog 5 units with 1 amp D50, and 10 mg albuterol nebulizer treatment.  She was given 2 L normal saline.  EDP discussed the case with nephrology who recommended administering Lokelma and repeating BMP in 2 hours.  The hospitalist service was consulted to admit for further evaluation and management.  Review of Systems: All systems reviewed and are negative except as documented in history of present illness above.   Past Medical History:  Diagnosis Date  . Arthritis   . CHF (congestive heart failure) ([place])   . COPD (chronic obstructive pulmonary disease) ([place])   . Diabetes mellitus without complication ([place])   . Pulmonary hypertension ([place])   . Tricuspid regurgitation     Past Surgical History:  Procedure Laterality Date  . CARDIAC SURGERY    . INTRAMEDULLARY (IM) NAIL INTERTROCHANTERIC Left 08/11/2019   Procedure: INTRAMEDULLARY (IM) NAIL INTERTROCHANTRIC with wound vac application;  Surgeon: Hessie Knows, MD;  Location: [facility] ORS;  Service: Orthopedics;  Laterality: Left;    Social History:  reports that she has quit smoking. She has never used smokeless tobacco. She reports previous alcohol use. She reports that she does not use drugs.  Allergies  Allergen Reactions  . Codeine Anaphylaxis  . Midazolam Other (See Comments)  Altered mental status    . Celecoxib Other (See Comments)    Excessive bleeding   . Lisinopril Cough    Family History  Problem Relation Age of Onset  . Heart disease Mother      Prior to Admission medications    Medication Sig Start Date End Date Taking? Authorizing Provider  albuterol (VENTOLIN HFA) 108 (90 Base) MCG/ACT inhaler Inhale 2 puffs into the lungs every 6 (six) hours as needed for wheezing or shortness of breath. 05/15/19  Yes [provider]  aspirin 325 MG tablet Take 325 mg by mouth 2 (two) times daily.   Yes [provider]  carvedilol (COREG) 3.125 MG tablet Take 3.125 mg by mouth 2 (two) times daily. 12/25/18 12/25/19 Yes [provider]  cetirizine (ZYRTEC) 10 MG tablet Take 10 mg by mouth daily as needed for allergies.    Yes [provider]  cholecalciferol (VITAMIN D3) 25 MCG (1000 UT) tablet Take 2,000 Units by mouth daily.   Yes [provider]  clopidogrel (PLAVIX) 75 MG tablet Take 75 mg by mouth daily. 02/10/19  Yes [provider]  diclofenac sodium (VOLTAREN) 1 % GEL Apply 4 g topically 3 (three) times daily as needed (joint pain). (apply to hands and knees) 04/04/19  Yes [provider]  ferrous [id number] C-folic acid (TRINSICON / FOLTRIN) capsule Take 1 capsule by mouth 2 (two) times daily after a meal. 08/14/19  Yes Fritzi Mandes, MD  fluticasone (FLONASE) 50 MCG/ACT nasal spray Place 2 sprays into both nostrils daily. 12/06/18  Yes [provider]  Fluticasone-Umeclidin-Vilant (TRELEGY ELLIPTA) 100-62.5-25 MCG/INH AEPB Inhale 1 puff into the lungs daily.   Yes [provider]  furosemide (LASIX) 40 MG tablet Take 40 mg by mouth 2 (two) times daily.  01/08/19  Yes [provider]  gabapentin (NEURONTIN) 600 MG tablet Take 1,200-1,800 mg by mouth See admin instructions. Take 2 tablets (1265m) by mouth every morning and take 3 tablets (18033m by mouth every night 11/14/18  Yes [provider]  insulin aspart (NOVOLOG) 100 UNIT/ML injection Inject 20 Units into the skin 3 (three) times daily with meals.  03/23/19  Yes [provider]  Insulin Detemir (LEVEMIR) 100 UNIT/ML Pen  Inject 25 Units into the skin daily with supper. 07/12/19  Yes [provider]  isosorbide mononitrate (IMDUR) 30 MG 24 hr tablet Take 30 mg by mouth 2 (two) times daily.  03/06/19  Yes [provider]  levothyroxine (SYNTHROID) 125 MCG tablet Take 125 mcg by mouth daily. 02/19/19  Yes [provider]  losartan (COZAAR) 100 MG tablet Take 100 mg by mouth daily. 12/15/18  Yes [provider]  nitroGLYCERIN (NITROLINGUAL) 0.4 MG/SPRAY spray Place 1 spray under the tongue every 5 (five) minutes as needed for chest pain. 12/20/18  Yes [provider]  omeprazole (PRILOSEC) 20 MG capsule Take 20 mg by mouth daily. 12/15/18  Yes [provider]  ondansetron (ZOFRAN) 4 MG tablet Take 4 mg by mouth daily as needed for nausea or vomiting.   Yes [provider]  oxyCODONE (ROXICODONE) 5 MG immediate release tablet Take 1-2 tablets (5-10 mg total) by mouth every 4 (four) hours as needed for severe pain. 08/14/19  Yes McLattie CornsPA-C  potassium chloride (KLOR-CON) 10 MEQ tablet Take 10 mEq by mouth daily. 04/03/19  Yes [provider]  simvastatin (ZOCOR) 20 MG tablet Take 20 mg by mouth daily. 01/21/19  Yes [provider]  traMADol (  ULTRAM) 50 MG tablet Take 1 tablet (50 mg total) by mouth every 6 (six) hours as needed for moderate pain. 08/14/19  Yes Fritzi Mandes, MD    Physical Exam: Vitals:   08/20/19 1539 08/20/19 1700 08/20/19 1806 08/20/19 1830  BP:  (!) 118/55 (!) 140/45 92/77  Pulse:      Resp:  '14 15 16  ' Temp:      TempSrc:      SpO2:      Weight: 90.7 kg     Height: '5\' 2"'  (1.575 m)       Constitutional: Elderly woman resting supine in bed, NAD, calm, appears tired but comfortable Eyes: PERRL, lids and conjunctivae normal ENMT: Mucous membranes are dry. Posterior pharynx clear of any exudate or lesions.Normal dentition.  Neck: normal, supple, no masses. Respiratory: clear to auscultation anteriorly, no  wheezing, no crackles. Normal respiratory effort. No accessory muscle use.  Cardiovascular: Regular rate and rhythm, no murmurs / rubs / gallops. No extremity edema. 2+ pedal pulses. Abdomen: no tenderness, no masses palpated. No hepatosplenomegaly. Bowel sounds positive.  Musculoskeletal: no clubbing / cyanosis. No joint deformity upper and lower extremities. ROM diminished LLE otherwise intact all other extremities, no contractures. Normal muscle tone.  Skin: Dressing to left hip and honeycomb dressing to left knee area Neurologic: CN 2-12 grossly intact. Sensation intact, Strength 5/5 in RUE, RLE, LUE, diminished LLE due to pain/weakness from recent hip fracture s/p repair Psychiatric: Alert and oriented x 3. Normal mood.    Labs on Admission: I have personally reviewed following labs and imaging studies  CBC: Recent Labs  Lab 08/14/19 0434 08/20/19 1543  WBC 13.5* 16.3*  NEUTROABS  --  12.2*  HGB 10.1* 9.6*  HCT 34.6* 31.2*  MCV 85.0 82.5  PLT 216 297   Basic Metabolic Panel: Recent Labs  Lab 08/20/19 1543  NA 126*  K 7.0*  CL 90*  CO2 24  GLUCOSE 116*  BUN 97*  CREATININE 5.21*  CALCIUM 8.3*  MG 3.2*   GFR: Estimated Creatinine Clearance: 9.2 mL/min (A) (by C-G formula based on SCr of 5.21 mg/dL (H)). Liver Function Tests: Recent Labs  Lab 08/20/19 1543  AST 233*  ALT 424*  ALKPHOS 160*  BILITOT 1.8*  PROT 5.7*  ALBUMIN 2.8*   No results for input(s): LIPASE, AMYLASE in the last 168 hours. No results for input(s): AMMONIA in the last 168 hours. Coagulation Profile: No results for input(s): INR, PROTIME in the last 168 hours. Cardiac Enzymes: No results for input(s): CKTOTAL, CKMB, CKMBINDEX, TROPONINI in the last 168 hours. BNP (last 3 results) No results for input(s): PROBNP in the last 8760 hours. HbA1C: No results for input(s): HGBA1C in the last 72 hours. CBG: Recent Labs  Lab 08/14/19 0745 08/14/19 1138  GLUCAP 105* 227*   Lipid Profile:  No results for input(s): CHOL, HDL, LDLCALC, TRIG, CHOLHDL, LDLDIRECT in the last 72 hours. Thyroid Function Tests: No results for input(s): TSH, T4TOTAL, FREET4, T3FREE, THYROIDAB in the last 72 hours. Anemia Panel: No results for input(s): VITAMINB12, FOLATE, FERRITIN, TIBC, IRON, RETICCTPCT in the last 72 hours. Urine analysis:    Component Value Date/Time   COLORURINE YELLOW (A) 08/20/2019 1747   APPEARANCEUR CLEAR (A) 08/20/2019 1747   LABSPEC 1.013 08/20/2019 1747   PHURINE 5.0 08/20/2019 [place] 08/20/2019 1747   HGBUR NEGATIVE 08/20/2019 1747   BILIRUBINUR NEGATIVE 08/20/2019 1747   KETONESUR NEGATIVE 08/20/2019 1747   PROTEINUR NEGATIVE 08/20/2019 1747  NITRITE NEGATIVE 08/20/2019 1747   LEUKOCYTESUR NEGATIVE 08/20/2019 1747    Radiological Exams on Admission: Dg Chest Portable 1 View  Result Date: 08/20/2019 CLINICAL DATA:  Altered mental status, weakness EXAM: PORTABLE CHEST 1 VIEW COMPARISON:  None. FINDINGS: The heart size and mediastinal contours are within normal limits. Calcific aortic knob. Coronary artery calcification. No focal airspace consolidation, pleural effusion, or pneumothorax. IMPRESSION: No acute cardiopulmonary findings. Electronically Signed   By: Davina Poke M.D.   On: 08/20/2019 15:57    EKG: Independently reviewed.  Junctional bradycardia  Assessment/Plan Principal Problem:   Acute renal failure (ARF) (HCC) Active Problems:   COPD (chronic obstructive pulmonary disease) (HCC)   Diabetes (HCC)   Hyperlipidemia   Hyperkalemia   Hyponatremia   Bradycardia   Coronary artery disease   Chronic diastolic CHF (congestive heart failure) ([place])   Hypothyroidism   Hypertension associated with diabetes ([place])  Destiny Zuniga is a 78 y.o. female with medical history significant for COPD with chronic respiratory failure on 3 L supplemental O2 via [place], chronic diastolic CHF, moderate-severe pulmonary hypertension, CAD s/p  PCI, insulin-dependent diabetes, hypertension, hypothyroidism, hyperlipidemia, and recent admission for left hip fracture s/p repair on 08/11/2019 who is admitted with acute renal failure.   Acute renal failure: Suspect multifactorial from volume depletion and medications, losartan and Lasix.  She was given 2 L LR in the ED. -Check renal ultrasound -Check urinalysis, urine sodium, urine creatinine -Will give gentle IV fluids overnight -Monitor volume status closely in the setting of CHF -Strict I/O's  Hyperkalemia: K 7.0 on arrival.  Question of hemolysis on labs.  AKI, losartan, and potassium supplements also contributing. -S/p insulin plus D50, albuterol nebulizer treatment, calcium gluconate, and Lokelma -Repeat labs tonight and again in the morning  Hyponatremia: Unclear etiology, appears volume depleted on admission.  Check urine and serum osmolality and urine sodium.  Continue gentle fluids as above and follow labs.  Holding Lasix.  Leukocytosis: White count 16.6 on admission.  Given empiric antibiotics in the ED.  No obvious infectious source.  Orthopedics have evaluated surgical wound site and do not feel deep infection present.  I will hold further antibiotics for now.  Chronic diastolic CHF: Appears volume depleted on admission as above.  Holding home Lasix and continue gentle IV fluids.  Monitor strict I/O's, urine output.  Will check BNP.  CAD s/p PCI: Denies any chest pain.  Troponin mildly elevated at 110.  Suspect demand ischemia in setting of acute renal failure.  Follow-up repeat troponin to trend of rising.  Continue aspirin, Plavix, Imdur, and statin.  Holding Coreg in setting of bradycardia on arrival.  Bradycardia: Seen on EKG on arrival.  Rate somewhat improved after initial management.  Will hold home Coreg and continue to monitor.  Anemia: Appears stable relative to recent labs.  No obvious bleeding noted.  Continue monitor.  COPD/chronic respiratory failure:  On 3 L supplemental O2 via [place] chronically.  No obvious exacerbation of COPD.  Continue supplemental oxygen, Trelegy Ellipta, and as needed albuterol.  Left hip fracture s/p ORIF 08/11/2019: Orthopedics following, do not suspect deep wound infection.  Appreciate further care.  Insulin-dependent diabetes: A1c 8.3% on 08/10/2019.  Will start reduced home dose Levemir plus sensitive SSI while in hospital.  Hypertension: Has been hypotensive on arrival.  Will hold home Coreg, Lasix, and losartan as above.  Hypothyroidism: Continue Synthroid, check TSH.  Hyperlipidemia: Continue simvastatin.  DVT prophylaxis: Subcutaneous heparin Code Status: Full code, confirmed with patient Family  Communication: Discussed with patient Disposition Plan: Pending clinical progress Consults called: EDP discussed with nephrology Admission status: Inpatient, patient likely requires greater than 2 midnight stay for further evaluation and management of acute renal failure, hyperkalemia, hyponatremia as she is high risk for decompensation given her multiple comorbidities as documented above.   Zada Finders MD [facility]  If 7PM-7AM, please contact night-coverage [email or web address]  08/20/2019, 8:19 PM

## 2019-08-20 NOTE — ED Notes (Signed)
Lab at bedside for lab recollect

## 2019-08-21 ENCOUNTER — Inpatient Hospital Stay: Payer: Medicare Other

## 2019-08-21 ENCOUNTER — Other Ambulatory Visit: Payer: Self-pay

## 2019-08-21 ENCOUNTER — Encounter: Payer: Self-pay | Admitting: Internal Medicine

## 2019-08-21 DIAGNOSIS — E875 Hyperkalemia: Secondary | ICD-10-CM

## 2019-08-21 DIAGNOSIS — R001 Bradycardia, unspecified: Secondary | ICD-10-CM

## 2019-08-21 DIAGNOSIS — R748 Abnormal levels of other serum enzymes: Secondary | ICD-10-CM | POA: Diagnosis present

## 2019-08-21 DIAGNOSIS — E871 Hypo-osmolality and hyponatremia: Secondary | ICD-10-CM

## 2019-08-21 LAB — HEPATITIS PANEL, ACUTE
HCV Ab: NONREACTIVE
Hep A IgM: NONREACTIVE
Hep B C IgM: NONREACTIVE
Hepatitis B Surface Ag: NONREACTIVE

## 2019-08-21 LAB — RENAL FUNCTION PANEL
Albumin: 2.5 g/dL — ABNORMAL LOW (ref 3.5–5.0)
Anion gap: 13 (ref 5–15)
BUN: 100 mg/dL — ABNORMAL HIGH (ref 8–23)
CO2: 24 mmol/L (ref 22–32)
Calcium: 8.3 mg/dL — ABNORMAL LOW (ref 8.9–10.3)
Chloride: 94 mmol/L — ABNORMAL LOW (ref 98–111)
Creatinine, Ser: 4.47 mg/dL — ABNORMAL HIGH (ref 0.44–1.00)
GFR calc Af Amer: 10 mL/min — ABNORMAL LOW (ref >60)
GFR calc non Af Amer: 9 mL/min — ABNORMAL LOW (ref >60)
Glucose, Bld: 128 mg/dL — ABNORMAL HIGH (ref 70–99)
Phosphorus: 6.5 mg/dL — ABNORMAL HIGH (ref 2.5–4.6)
Potassium: 4.9 mmol/L (ref 3.5–5.1)
Sodium: 131 mmol/L — ABNORMAL LOW (ref 135–145)

## 2019-08-21 LAB — HEPATIC FUNCTION PANEL
ALT: 323 U/L — ABNORMAL HIGH (ref 0–44)
AST: 150 U/L — ABNORMAL HIGH (ref 15–41)
Albumin: 2.6 g/dL — ABNORMAL LOW (ref 3.5–5.0)
Alkaline Phosphatase: 136 U/L — ABNORMAL HIGH (ref 38–126)
Bilirubin, Direct: 0.6 mg/dL — ABNORMAL HIGH (ref 0.0–0.2)
Indirect Bilirubin: 1.2 mg/dL — ABNORMAL HIGH (ref 0.3–0.9)
Total Bilirubin: 1.8 mg/dL — ABNORMAL HIGH (ref 0.3–1.2)
Total Protein: 5.2 g/dL — ABNORMAL LOW (ref 6.5–8.1)

## 2019-08-21 LAB — URINE CULTURE: Culture: NO GROWTH

## 2019-08-21 LAB — GLUCOSE, CAPILLARY
Glucose-Capillary: 123 mg/dL — ABNORMAL HIGH (ref 70–99)
Glucose-Capillary: 173 mg/dL — ABNORMAL HIGH (ref 70–99)
Glucose-Capillary: 223 mg/dL — ABNORMAL HIGH (ref 70–99)
Glucose-Capillary: 204 mg/dL — ABNORMAL HIGH (ref 70–99)

## 2019-08-21 LAB — CBC
HCT: 28.7 % — ABNORMAL LOW (ref 36.0–46.0)
Hemoglobin: 8.9 g/dL — ABNORMAL LOW (ref 12.0–15.0)
MCH: 25.1 pg — ABNORMAL LOW (ref 26.0–34.0)
MCHC: 31 g/dL (ref 30.0–36.0)
MCV: 81.1 fL (ref 80.0–100.0)
Platelets: 223 10*3/uL (ref 150–400)
RBC: 3.54 MIL/uL — ABNORMAL LOW (ref 3.87–5.11)
RDW: 19.2 % — ABNORMAL HIGH (ref 11.5–15.5)
WBC: 12.1 10*3/uL — ABNORMAL HIGH (ref 4.0–10.5)
nRBC: 0.2 % (ref 0.0–0.2)

## 2019-08-21 MED ORDER — HYDROMORPHONE HCL 1 MG/ML IJ SOLN
0.5000 mg | INTRAMUSCULAR | Status: AC | PRN
  Administered 2019-08-21 – 2019-08-22 (×4): 0.5 mg via INTRAVENOUS
  Filled 2019-08-21 (×4): qty 1

## 2019-08-21 MED ORDER — ASPIRIN EC 81 MG PO TBEC
81.0000 mg | DELAYED_RELEASE_TABLET | Freq: Every day | ORAL | Status: DC
  Filled 2019-08-21 (×4): qty 1

## 2019-08-21 MED ORDER — CHLORHEXIDINE GLUCONATE CLOTH 2 % EX PADS
6.0000 | MEDICATED_PAD | Freq: Every day | CUTANEOUS | Status: DC
  Administered 2019-08-21 – 2019-08-29 (×9): 6 via TOPICAL

## 2019-08-21 MED ORDER — SODIUM CHLORIDE 0.9 % IV SOLN
INTRAVENOUS | Status: DC
  Administered 2019-08-21 – 2019-08-23 (×4): via INTRAVENOUS

## 2019-08-21 NOTE — Progress Notes (Addendum)
Progress Note    Destiny Zuniga  GDJ:[id number] DOB: 29-Feb-1941  DOA: 08/20/2019 PCP: Kirk Ruths, MD      Brief Narrative:    Medical records reviewed and are as summarized below:  Destiny Zuniga is an 78 y.o. female with history significant for COPD, type II DM, pulmonary hypertension, tricuspid regurgitation, CHF, recent left hip fracture was brought to the hospital because of altered mental status.  She was found to have acute kidney injury with creatinine of 5.21.  Baseline creatinine is around 1.15.      Assessment/Plan:   Principal Problem:   Acute renal failure (ARF) (HCC) Active Problems:   COPD (chronic obstructive pulmonary disease) (HCC)   Diabetes (HCC)   Hyperlipidemia   Hyperkalemia   Hyponatremia   Bradycardia   Coronary artery disease   Chronic diastolic CHF (congestive heart failure) (HCC)   Hypothyroidism   Hypertension associated with diabetes ([place])   Body mass index is 36.58 kg/m.   Acute kidney injury on CKD stage III: Creatinine is slowly improving.  Continue normal saline at 75 cc/h.  No evidence of obstruction no hydronephrosis on renal ultrasound.  Consulted nephrologist to assist with management.  Monitor BMP.  Hyperkalemia: Improved  Hyponatremia: Likely hypovolemic hyponatremia.  Sodium level is improving.  Altered mental status/metabolic encephalopathy: Improved  Leukocytosis: Improving.  No clear evidence of infection at this time.  This may be reactive.  Follow-up cultures.  Chronic diastolic CHF: Lasix has been held.  CAD status post PCI: Continue aspirin, Plavix.  Hold simvastatin because of elevated liver enzymes.  Beta-blocker held because of sinus bradycardia  Elevated troponins: This is probably from demand ischemia/acute kidney injury.  No complaints of chest pain or shortness of breath.  Elevated liver enzymes: Etiology unclear.  Obtain liver ultrasound for further evaluation.  Check  hepatitis panel  Sinus bradycardia: Heart rate been stable between 50-60 beats per minutes.  Hypertension: Coreg, Lasix and losartan have been held  IDDM: Continue Levemir.  Monitor glucose level closely  COPD and chronic hypoxemic respiratory failure: Continue bronchodilators and oxygen via nasal cannula  Recent left hip fracture status post ORIF on 08/11/2019: Patient was seen by orthopedic surgeon.  No evidence of deep tissue infection.    Family Communication/Anticipated D/C date and plan/Code Status   DVT prophylaxis: Heparin Code Status: Full code Family Communication: None Disposition Plan: Discharge to SNF in 3 to 4 days      Subjective:   No chest pain, shortness of breath.  She feels better.  Objective:    Vitals:   08/21/19 0600 08/21/19 0615 08/21/19 0630 08/21/19 0824  BP: (!) 131/47  (!) 127/37   Pulse:  (!) 51 (!) 50 60  Resp: (!) 21 13 16 17   Temp:      TempSrc:      SpO2:  97% 96% 100%  Weight:      Height:        Intake/Output Summary (Last 24 hours) at 08/21/2019 0845 Last data filed at 08/21/2019 6222 Gross per 24 hour  Intake 450.23 ml  Output 1665 ml  Net -1214.77 ml   Filed Weights   08/20/19 1539  Weight: 90.7 kg    Exam:  GEN: NAD SKIN: blisters on the left lower quadrant, superficial wound on left lateral thigh (?from ruptured blister). Staples in place on the left lateral thigh from recent surgery. EYES: EOMI, no pallor or icterus ENT: MMM CV: RRR PULM: CTA B ABD: soft,  obese, NT, +BS CNS: AAO x 3, non focal EXT: left thigh is slightly swollen but no tenderness GU: Foley catheter draining amber urine   Data Reviewed:   I have personally reviewed following labs and imaging studies:  Labs: Labs show the following:   Basic Metabolic Panel: Recent Labs  Lab 08/20/19 1543 08/20/19 2056 08/21/19 0513  NA 126* 128* 131*  K 7.0* 5.2* 4.9  CL 90* 91* 94*  CO2 24 23 24   GLUCOSE 116* 170* 128*  BUN 97* 94* 100*    CREATININE 5.21* 4.56* 4.47*  CALCIUM 8.3* 8.4* 8.3*  MG 3.2*  --   --   PHOS  --  6.1* 6.5*   GFR Estimated Creatinine Clearance: 10.7 mL/min (A) (by C-G formula based on SCr of 4.47 mg/dL (H)). Liver Function Tests: Recent Labs  Lab 08/20/19 1543 08/20/19 2056 08/21/19 0513  AST 233*  --  150*  ALT 424*  --  323*  ALKPHOS 160*  --  136*  BILITOT 1.8*  --  1.8*  PROT 5.7*  --  5.2*  ALBUMIN 2.8* 2.6* 2.5*   2.6*   No results for input(s): LIPASE, AMYLASE in the last 168 hours. No results for input(s): AMMONIA in the last 168 hours. Coagulation profile No results for input(s): INR, PROTIME in the last 168 hours.  CBC: Recent Labs  Lab 08/20/19 1543 08/21/19 0513  WBC 16.3* 12.1*  NEUTROABS 12.2*  --   HGB 9.6* 8.9*  HCT 31.2* 28.7*  MCV 82.5 81.1  PLT 265 223   Cardiac Enzymes: No results for input(s): CKTOTAL, CKMB, CKMBINDEX, TROPONINI in the last 168 hours. BNP (last 3 results) No results for input(s): PROBNP in the last 8760 hours. CBG: Recent Labs  Lab 08/14/19 1138 08/20/19 2230 08/21/19 0752  GLUCAP 227* 135* 123*   D-Dimer: No results for input(s): DDIMER in the last 72 hours. Hgb A1c: No results for input(s): HGBA1C in the last 72 hours. Lipid Profile: No results for input(s): CHOL, HDL, LDLCALC, TRIG, CHOLHDL, LDLDIRECT in the last 72 hours. Thyroid function studies: Recent Labs    08/20/19 1543  TSH 3.708   Anemia work up: No results for input(s): VITAMINB12, FOLATE, FERRITIN, TIBC, IRON, RETICCTPCT in the last 72 hours. Sepsis Labs: Recent Labs  Lab 08/20/19 1543 08/20/19 2035 08/21/19 0513  WBC 16.3*  --  12.1*  LATICACIDVEN  --  1.3  --     Microbiology Recent Results (from the past 240 hour(s))  Culture, blood (routine x 2)     Status: None (Preliminary result)   Collection Time: 08/20/19  5:21 PM   Specimen: BLOOD  Result Value Ref Range Status   Specimen Description BLOOD RIGHT ANTECUBITAL  Final   Special Requests    Final    BOTTLES DRAWN AEROBIC AND ANAEROBIC Blood Culture adequate volume   Culture   Final    NO GROWTH < 12 HOURS Performed at [facility], [address] [place], [place] [address]    Report Status PENDING  Incomplete  SARS Coronavirus 2 by RT PCR (hospital order, performed in [facility] hospital lab) Nasopharyngeal Nasopharyngeal Swab     Status: None   Collection Time: 08/20/19  5:47 PM   Specimen: Nasopharyngeal Swab  Result Value Ref Range Status   SARS Coronavirus 2 NEGATIVE NEGATIVE Final    Comment: (NOTE) If result is NEGATIVE SARS-CoV-2 target nucleic acids are NOT DETECTED. The SARS-CoV-2 RNA is generally detectable in upper and lower  respiratory  specimens during the acute phase of infection. The lowest  concentration of SARS-CoV-2 viral copies this assay can detect is 250  copies / mL. A negative result does not preclude SARS-CoV-2 infection  and should not be used as the sole basis for treatment or other  patient management decisions.  A negative result may occur with  improper specimen collection / handling, submission of specimen other  than nasopharyngeal swab, presence of viral mutation(s) within the  areas targeted by this assay, and inadequate number of viral copies  (<250 copies / mL). A negative result must be combined with clinical  observations, patient history, and epidemiological information. If result is POSITIVE SARS-CoV-2 target nucleic acids are DETECTED. The SARS-CoV-2 RNA is generally detectable in upper and lower  respiratory specimens dur ing the acute phase of infection.  Positive  results are indicative of active infection with SARS-CoV-2.  Clinical  correlation with patient history and other diagnostic information is  necessary to determine patient infection status.  Positive results do  not rule out bacterial infection or co-infection with other viruses. If result is PRESUMPTIVE POSTIVE SARS-CoV-2 nucleic acids MAY BE PRESENT.    A presumptive positive result was obtained on the submitted specimen  and confirmed on repeat testing.  While 2019 novel coronavirus  (SARS-CoV-2) nucleic acids may be present in the submitted sample  additional confirmatory testing may be necessary for epidemiological  and / or clinical management purposes  to differentiate between  SARS-CoV-2 and other Sarbecovirus currently known to infect humans.  If clinically indicated additional testing with an alternate test  methodology [phone]) is advised. The SARS-CoV-2 RNA is generally  detectable in upper and lower respiratory sp ecimens during the acute  phase of infection. The expected result is Negative. Fact Sheet for Patients:  BoilerBrush.com.cy Fact Sheet for Healthcare Providers: https://pope.com/ This test is not yet approved or cleared by the [place] FDA and has been authorized for detection and/or diagnosis of SARS-CoV-2 by FDA under an Emergency Use Authorization (EUA).  This EUA will remain in effect (meaning this test can be used) for the duration of the COVID-19 declaration under Section 564(b)(1) of the Act, 21 U.S.C. section 360bbb-3(b)(1), unless the authorization is terminated or revoked sooner. Performed at [facility], [address] [place], [place] [address]     Procedures and diagnostic studies:  US Renal  Result Date: Sep 17, 2019 CLINICAL DATA:  Acute renal failure EXAM: RENAL / URINARY TRACT ULTRASOUND COMPLETE COMPARISON:  None. FINDINGS: Right Kidney: Renal measurements: 10.9 x 4.3 x 5.5 cm = volume: 135.7 mL . Echogenicity and renal cortical thickness are within normal limits. No mass or hydronephrosis visualized. There is trace perinephric fluid on the right. No sonographically demonstrable calculus or ureterectasis. Left Kidney: Renal measurements: 10.5 x 6.0 x 5.2 cm = volume: 170.6 mL. Echogenicity and renal cortical thickness are within  normal limits. No mass, perinephric fluid, or hydronephrosis visualized. No sonographically demonstrable calculus or ureterectasis. Bladder: Foley catheter in place. A small amount of urine remains in the bladder. Bladder appears grossly normal with Foley in place. Other: None. IMPRESSION: Slight amount perinephric fluid on the right. Kidneys otherwise appear normal bilaterally. Foley catheter in place with small amount of urine remaining in the urinary bladder. Electronically Signed   By: Bretta Bang III M.D.   On: 09-17-19 07:21   Dg Chest Portable 1 View  Result Date: 08/20/2019 CLINICAL DATA:  Altered mental status, weakness EXAM: PORTABLE CHEST 1 VIEW COMPARISON:  None. FINDINGS:  The heart size and mediastinal contours are within normal limits. Calcific aortic knob. Coronary artery calcification. No focal airspace consolidation, pleural effusion, or pneumothorax. IMPRESSION: No acute cardiopulmonary findings. Electronically Signed   By: Duanne Guess M.D.   On: 08/20/2019 15:57    Medications:    aspirin EC  81 mg Oral Daily   clopidogrel  75 mg Oral Daily   heparin  5,000 Units Subcutaneous Q8H   insulin aspart  0-9 Units Subcutaneous TID WC   insulin detemir  10 Units Subcutaneous QHS   isosorbide mononitrate  30 mg Oral BID   levothyroxine  125 mcg Oral Daily   simvastatin  20 mg Oral Daily   sodium chloride flush  3 mL Intravenous Q12H   umeclidinium-vilanterol  1 puff Inhalation Daily   Continuous Infusions:   LOS: 1 day   Talton Delpriore  [facility] Pager [phone].   *Please refer to [email or web address], password [email or web address] to get updated schedule on who will round on this patient, as hospitalists switch teams weekly. If 7PM-7AM, please contact night-coverage at [email or web address], password [email or web address] for any overnight needs.  08/21/2019, 8:45 AM

## 2019-08-21 NOTE — ED Notes (Signed)
Reported off to [employer], pt to be moved to [place] awaiting admission bed.

## 2019-08-21 NOTE — ED Notes (Signed)
Pt spilled coffee on right chest. Area red, pt denies pain or discomfort. Pt assisted with remainder of coffee by tech.

## 2019-08-21 NOTE — Consult Note (Signed)
[facility] CONSULT NOTE    Date: 08/21/2019                  Patient Name:  Destiny Zuniga  MRN: [id number]  DOB: [birth year]  Age / Sex: 78 y.o., female         PCP: Kirk Ruths, MD                 Service Requesting Consult:  Hospitalist                 Reason for Consult: Acute kidney injury/chronic kidney disease stage IIIA            History of Present Illness: Patient is a 78 y.o. female with a PMHx of COPD, diabetes mellitus type 2, pulmonary hypertension, tricuspid regurgitation, congestive heart failure, recent left hip fracture, who was admitted to [facility] on 08/20/2019 for evaluation of altered mental status.  She was recently at a skilled nursing facility post hip fracture.  She was brought here for altered mental status as above.  Patient cannot provide any history as to why she is here at the moment.  The patient's baseline creatinine appears to be 1.15 with an EGFR 45.  When she came back in on August 20, 2019 her BUN was 97 with a creatinine of 5.21.  Renal function has slightly improved as creatinine is down to 4.47 however BUN still high at 100.  No mention of nausea, vomiting, diarrhea, or NSAID use in the chart.  Patient is currently receiving IV fluids with 0.9 normal saline at 75 cc/h.   Medications: Outpatient medications: (Not in a hospital admission)   Current medications: Current Facility-Administered Medications  Medication Dose Route Frequency Provider Last Rate Last Dose  . 0.9 %  sodium chloride infusion   Intravenous Continuous Jennye Boroughs, MD 75 mL/hr at 08/21/19 1031    . acetaminophen (TYLENOL) tablet 650 mg  650 mg Oral Q6H PRN Lenore Cordia, MD       Or  . acetaminophen (TYLENOL) suppository 650 mg  650 mg Rectal Q6H PRN Zada Finders R, MD      . albuterol (PROVENTIL) (2.5 MG/3ML) 0.083% nebulizer solution 2.5 mg  2.5 mg Inhalation Q6H PRN Lenore Cordia, MD      . aspirin EC tablet 81 mg  81 mg Oral  Daily Jennye Boroughs, MD      . clopidogrel (PLAVIX) tablet 75 mg  75 mg Oral Daily Zada Finders R, MD   75 mg at 08/21/19 1032  . heparin injection 5,000 Units  5,000 Units Subcutaneous Q8H Lenore Cordia, MD   5,000 Units at 08/21/19 1326  . insulin aspart (novoLOG) injection 0-9 Units  0-9 Units Subcutaneous TID WC Lenore Cordia, MD   2 Units at 08/21/19 1321  . insulin detemir (LEVEMIR) injection 10 Units  10 Units Subcutaneous QHS Lenore Cordia, MD   Stopped at 08/21/19 0240  . isosorbide mononitrate (IMDUR) 24 hr tablet 30 mg  30 mg Oral BID Lenore Cordia, MD   30 mg at 08/21/19 1033  . levothyroxine (SYNTHROID) tablet 125 mcg  125 mcg Oral Daily Lenore Cordia, MD   125 mcg at 08/21/19 0630  . ondansetron (ZOFRAN) tablet 4 mg  4 mg Oral Q6H PRN Lenore Cordia, MD       Or  . ondansetron (ZOFRAN) injection 4 mg  4 mg Intravenous Q6H PRN Lenore Cordia, MD      .  sodium chloride flush (NS) 0.9 % injection 3 mL  3 mL Intravenous Q12H Zada Finders R, MD   3 mL at 08/20/19 2236  . [START ON 08/22/2019] umeclidinium-vilanterol (ANORO ELLIPTA) 62.5-25 MCG/INH 1 puff  1 puff Inhalation Daily Lenore Cordia, MD       Current Outpatient Medications  Medication Sig Dispense Refill  . albuterol (VENTOLIN HFA) 108 (90 Base) MCG/ACT inhaler Inhale 2 puffs into the lungs every 6 (six) hours as needed for wheezing or shortness of breath.    Marland Kitchen aspirin 325 MG tablet Take 325 mg by mouth 2 (two) times daily.    . carvedilol (COREG) 3.125 MG tablet Take 3.125 mg by mouth 2 (two) times daily.    . cetirizine (ZYRTEC) 10 MG tablet Take 10 mg by mouth daily as needed for allergies.     . cholecalciferol (VITAMIN D3) 25 MCG (1000 UT) tablet Take 2,000 Units by mouth daily.    . clopidogrel (PLAVIX) 75 MG tablet Take 75 mg by mouth daily.    . diclofenac sodium (VOLTAREN) 1 % GEL Apply 4 g topically 3 (three) times daily as needed (joint pain). (apply to hands and knees)    . ferrous  [id number] C-folic acid (TRINSICON / FOLTRIN) capsule Take 1 capsule by mouth 2 (two) times daily after a meal. 60 capsule 0  . fluticasone (FLONASE) 50 MCG/ACT nasal spray Place 2 sprays into both nostrils daily.    . Fluticasone-Umeclidin-Vilant (TRELEGY ELLIPTA) 100-62.5-25 MCG/INH AEPB Inhale 1 puff into the lungs daily.    . furosemide (LASIX) 40 MG tablet Take 40 mg by mouth 2 (two) times daily.     Marland Kitchen gabapentin (NEURONTIN) 600 MG tablet Take 1,200-1,800 mg by mouth See admin instructions. Take 2 tablets (1239m) by mouth every morning and take 3 tablets (18086m by mouth every night    . insulin aspart (NOVOLOG) 100 UNIT/ML injection Inject 20 Units into the skin 3 (three) times daily with meals.     . Insulin Detemir (LEVEMIR) 100 UNIT/ML Pen Inject 25 Units into the skin daily with supper.    . isosorbide mononitrate (IMDUR) 30 MG 24 hr tablet Take 30 mg by mouth 2 (two) times daily.     . Marland Kitchenevothyroxine (SYNTHROID) 125 MCG tablet Take 125 mcg by mouth daily.    . Marland Kitchenosartan (COZAAR) 100 MG tablet Take 100 mg by mouth daily.    . nitroGLYCERIN (NITROLINGUAL) 0.4 MG/SPRAY spray Place 1 spray under the tongue every 5 (five) minutes as needed for chest pain.    . Marland Kitchenmeprazole (PRILOSEC) 20 MG capsule Take 20 mg by mouth daily.    . ondansetron (ZOFRAN) 4 MG tablet Take 4 mg by mouth daily as needed for nausea or vomiting.    . Marland KitchenxyCODONE (ROXICODONE) 5 MG immediate release tablet Take 1-2 tablets (5-10 mg total) by mouth every 4 (four) hours as needed for severe pain. 60 tablet 0  . potassium chloride (KLOR-CON) 10 MEQ tablet Take 10 mEq by mouth daily.    . simvastatin (ZOCOR) 20 MG tablet Take 20 mg by mouth daily.    . traMADol (ULTRAM) 50 MG tablet Take 1 tablet (50 mg total) by mouth every 6 (six) hours as needed for moderate pain. 15 tablet 0      Allergies: Allergies  Allergen Reactions  . Codeine Anaphylaxis  . Midazolam Other (See Comments)    Altered mental status    .  Celecoxib Other (See Comments)    Excessive bleeding   .  Lisinopril Cough      Past Medical History: Past Medical History:  Diagnosis Date  . Arthritis   . CHF (congestive heart failure) ([place])   . COPD (chronic obstructive pulmonary disease) ([place])   . Diabetes mellitus without complication ([place])   . Pulmonary hypertension ([place])   . Tricuspid regurgitation      Past Surgical History: Past Surgical History:  Procedure Laterality Date  . CARDIAC SURGERY    . INTRAMEDULLARY (IM) NAIL INTERTROCHANTERIC Left 08/11/2019   Procedure: INTRAMEDULLARY (IM) NAIL INTERTROCHANTRIC with wound vac application;  Surgeon: Hessie Knows, MD;  Location: [facility] ORS;  Service: Orthopedics;  Laterality: Left;     Family History: Family History  Problem Relation Age of Onset  . Heart disease Mother      Social History: Social History   Socioeconomic History  . Marital status: Widowed    Spouse name: Not on file  . Number of children: Not on file  . Years of education: Not on file  . Highest education level: Not on file  Occupational History  . Not on file  Social Needs  . Financial resource strain: Not on file  . Food insecurity    Worry: Not on file    Inability: Not on file  . Transportation needs    Medical: Not on file    Non-medical: Not on file  Tobacco Use  . Smoking status: Former Research scientist (life sciences)  . Smokeless tobacco: Never Used  Substance and Sexual Activity  . Alcohol use: Not Currently  . Drug use: Never  . Sexual activity: Yes  Lifestyle  . Physical activity    Days per week: Not on file    Minutes per session: Not on file  . Stress: Not on file  Relationships  . Social Herbalist on phone: Not on file    Gets together: Not on file    Attends religious service: Not on file    Active member of club or organization: Not on file    Attends meetings of clubs or organizations: Not on file    Relationship status: Not on file  . Intimate partner violence    Fear  of current or ex partner: Not on file    Emotionally abused: Not on file    Physically abused: Not on file    Forced sexual activity: Not on file  Other Topics Concern  . Not on file  Social History Narrative  . Not on file     Review of Systems: Patient unable to provide review of systems secondary to altered mental status  Vital Signs: Blood pressure (!) 140/40, pulse 60, temperature 97.9 F (36.6 C), temperature source Oral, resp. rate 17, height '5\' 2"'  (1.575 m), weight 90.7 kg, SpO2 100 %.  Weight trends: Filed Weights   08/20/19 1539  Weight: 90.7 kg    Physical Exam: General: NAD, laying in bed  Head: Normocephalic, atraumatic.  Eyes: Anicteric, EOMI  Nose: Mucous membranes moist, not inflammed, nonerythematous.  Throat: Oropharynx nonerythematous, no exudate appreciated.   Neck: Supple, trachea midline.  Lungs:  Normal respiratory effort. Clear to auscultation BL without crackles or wheezes.  Heart: RRR. S1 and S2 normal without gallop, murmur, or rubs.  Abdomen:  BS normoactive. Soft, Nondistended, non-tender.  No masses or organomegaly.  Extremities: No pretibial edema.  Neurologic: Lethargic but arousable, will follow simple commands  Skin: No visible rashes, scars.    Lab results: Basic Metabolic Panel: Recent Labs  Lab 08/20/19 1543  08/20/19 2056 08/21/19 0513  NA 126* 128* 131*  K 7.0* 5.2* 4.9  CL 90* 91* 94*  CO2 '24 23 24  ' GLUCOSE 116* 170* 128*  BUN 97* 94* 100*  CREATININE 5.21* 4.56* 4.47*  CALCIUM 8.3* 8.4* 8.3*  MG 3.2*  --   --   PHOS  --  6.1* 6.5*    Liver Function Tests: Recent Labs  Lab 08/20/19 1543 08/20/19 2056 08/21/19 0513  AST 233*  --  150*  ALT 424*  --  323*  ALKPHOS 160*  --  136*  BILITOT 1.8*  --  1.8*  PROT 5.7*  --  5.2*  ALBUMIN 2.8* 2.6* 2.5*  2.6*   No results for input(s): LIPASE, AMYLASE in the last 168 hours. No results for input(s): AMMONIA in the last 168 hours.  CBC: Recent Labs  Lab  08/20/19 1543 08/21/19 0513  WBC 16.3* 12.1*  NEUTROABS 12.2*  --   HGB 9.6* 8.9*  HCT 31.2* 28.7*  MCV 82.5 81.1  PLT 265 223    Cardiac Enzymes: No results for input(s): CKTOTAL, CKMB, CKMBINDEX, TROPONINI in the last 168 hours.  BNP: Invalid input(s): POCBNP  CBG: Recent Labs  Lab 08/20/19 2230 08/21/19 0752 08/21/19 1211  GLUCAP 135* 123* 173*    Microbiology: Results for orders placed or performed during the hospital encounter of 08/20/19  Culture, blood (routine x 2)     Status: None (Preliminary result)   Collection Time: 08/20/19  5:21 PM   Specimen: BLOOD  Result Value Ref Range Status   Specimen Description BLOOD RIGHT ANTECUBITAL  Final   Special Requests   Final    BOTTLES DRAWN AEROBIC AND ANAEROBIC Blood Culture adequate volume   Culture   Final    NO GROWTH < 12 HOURS Performed at [facility], [address], [place], [place] [address]    Report Status PENDING  Incomplete  SARS Coronavirus 2 by RT PCR (hospital order, performed in [facility] hospital lab) Nasopharyngeal Nasopharyngeal Swab     Status: None   Collection Time: 08/20/19  5:47 PM   Specimen: Nasopharyngeal Swab  Result Value Ref Range Status   SARS Coronavirus 2 NEGATIVE NEGATIVE Final    Comment: (NOTE) If result is NEGATIVE SARS-CoV-2 target nucleic acids are NOT DETECTED. The SARS-CoV-2 RNA is generally detectable in upper and lower  respiratory specimens during the acute phase of infection. The lowest  concentration of SARS-CoV-2 viral copies this assay can detect is 250  copies / mL. A negative result does not preclude SARS-CoV-2 infection  and should not be used as the sole basis for treatment or other  patient management decisions.  A negative result may occur with  improper specimen collection / handling, submission of specimen other  than nasopharyngeal swab, presence of viral mutation(s) within the  areas targeted by this assay, and inadequate number of viral  copies  (<250 copies / mL). A negative result must be combined with clinical  observations, patient history, and epidemiological information. If result is POSITIVE SARS-CoV-2 target nucleic acids are DETECTED. The SARS-CoV-2 RNA is generally detectable in upper and lower  respiratory specimens dur ing the acute phase of infection.  Positive  results are indicative of active infection with SARS-CoV-2.  Clinical  correlation with patient history and other diagnostic information is  necessary to determine patient infection status.  Positive results do  not rule out bacterial infection or co-infection with other viruses. If result is PRESUMPTIVE POSTIVE SARS-CoV-2 nucleic  acids MAY BE PRESENT.   A presumptive positive result was obtained on the submitted specimen  and confirmed on repeat testing.  While 2019 novel coronavirus  (SARS-CoV-2) nucleic acids may be present in the submitted sample  additional confirmatory testing may be necessary for epidemiological  and / or clinical management purposes  to differentiate between  SARS-CoV-2 and other Sarbecovirus currently known to infect humans.  If clinically indicated additional testing with an alternate test  methodology [phone]) is advised. The SARS-CoV-2 RNA is generally  detectable in upper and lower respiratory sp ecimens during the acute  phase of infection. The expected result is Negative. Fact Sheet for Patients:  StrictlyIdeas.no Fact Sheet for Healthcare Providers: BankingDealers.co.za This test is not yet approved or cleared by the [place] FDA and has been authorized for detection and/or diagnosis of SARS-CoV-2 by FDA under an Emergency Use Authorization (EUA).  This EUA will remain in effect (meaning this test can be used) for the duration of the COVID-19 declaration under Section 564(b)(1) of the Act, 21 U.S.C. section 360bbb-3(b)(1), unless the authorization is terminated  or revoked sooner. Performed at [facility], [address] [place], [place] [address]     Coagulation Studies: No results for input(s): LABPROT, INR in the last 72 hours.  Urinalysis: Recent Labs    08/20/19 1747  COLORURINE YELLOW*  LABSPEC 1.013  PHURINE 5.0  GLUCOSEU NEGATIVE  HGBUR NEGATIVE  BILIRUBINUR NEGATIVE  KETONESUR NEGATIVE  PROTEINUR NEGATIVE  NITRITE NEGATIVE  LEUKOCYTESUR NEGATIVE      Imaging: US Renal  Result Date: 08/21/2019 CLINICAL DATA:  Acute renal failure EXAM: RENAL / URINARY TRACT ULTRASOUND COMPLETE COMPARISON:  None. FINDINGS: Right Kidney: Renal measurements: 10.9 x 4.3 x 5.5 cm = volume: 135.7 mL . Echogenicity and renal cortical thickness are within normal limits. No mass or hydronephrosis visualized. There is trace perinephric fluid on the right. No sonographically demonstrable calculus or ureterectasis. Left Kidney: Renal measurements: 10.5 x 6.0 x 5.2 cm = volume: 170.6 mL. Echogenicity and renal cortical thickness are within normal limits. No mass, perinephric fluid, or hydronephrosis visualized. No sonographically demonstrable calculus or ureterectasis. Bladder: Foley catheter in place. A small amount of urine remains in the bladder. Bladder appears grossly normal with Foley in place. Other: None. IMPRESSION: Slight amount perinephric fluid on the right. Kidneys otherwise appear normal bilaterally. Foley catheter in place with small amount of urine remaining in the urinary bladder. Electronically Signed   By: Lowella Grip III M.D.   On: 08/21/2019 07:21   Dg Chest Portable 1 View  Result Date: 08/20/2019 CLINICAL DATA:  Altered mental status, weakness EXAM: PORTABLE CHEST 1 VIEW COMPARISON:  None. FINDINGS: The heart size and mediastinal contours are within normal limits. Calcific aortic knob. Coronary artery calcification. No focal airspace consolidation, pleural effusion, or pneumothorax. IMPRESSION: No acute cardiopulmonary  findings. Electronically Signed   By: Davina Poke M.D.   On: 08/20/2019 15:57      Assessment & Plan: Pt is a 78 y.o. female  with a PMHx of COPD, diabetes mellitus type 2, pulmonary hypertension, tricuspid regurgitation, congestive heart failure, recent left hip fracture, who was admitted to [facility] on 08/20/2019 for evaluation of altered mental status.   1.  Acute kidney injury/chronic kidney disease stage IIIa.  Baseline EGFR 45.  Suspect that she developed acute volume depletion.  Urine sodium was quite low at less than 10.  Continue 0.9 normal saline at 75 cc/h for now.  Renal ultrasound reviewed and  negative for hydronephrosis.  No urgent indication for dialysis at the moment.  2.  Hyponatremia.  Serum sodium currently 131.  Suspect hypovolemic hyponatremia.  Continue IV fluid hydration as above.  3.  Anemia of chronic kidney disease.  Hemoglobin currently 8.9.  May need to consider Epogen as an outpatient.  4.  Thanks for consultation.

## 2019-08-21 NOTE — ED Notes (Addendum)
Attempted to call report to [facility] - [facility] is concerned that this is their last bed and if pt still needs it. I am texting admitting doctor for direction.

## 2019-08-21 NOTE — ED Notes (Signed)
Pt states she cannot leave until she finds her ipad and glasses. No ipad  And glasses are seen in pt's room

## 2019-08-21 NOTE — Plan of Care (Signed)

## 2019-08-21 NOTE — ED Notes (Signed)
Pt bed changed. Left hip with staples intact, no s/s of infection, clear serous drainage. Left hip with broken fluid blister draining serous drainage. Left knee with dressing intact.

## 2019-08-22 ENCOUNTER — Inpatient Hospital Stay: Payer: Medicare Other

## 2019-08-22 DIAGNOSIS — I10 Essential (primary) hypertension: Secondary | ICD-10-CM

## 2019-08-22 DIAGNOSIS — E039 Hypothyroidism, unspecified: Secondary | ICD-10-CM

## 2019-08-22 DIAGNOSIS — E1159 Type 2 diabetes mellitus with other circulatory complications: Secondary | ICD-10-CM

## 2019-08-22 DIAGNOSIS — R4182 Altered mental status, unspecified: Secondary | ICD-10-CM | POA: Diagnosis not present

## 2019-08-22 DIAGNOSIS — N179 Acute kidney failure, unspecified: Secondary | ICD-10-CM | POA: Diagnosis present

## 2019-08-22 LAB — CBC
HCT: 30.2 % — ABNORMAL LOW (ref 36.0–46.0)
Hemoglobin: 9.2 g/dL — ABNORMAL LOW (ref 12.0–15.0)
MCH: 25.5 pg — ABNORMAL LOW (ref 26.0–34.0)
MCHC: 30.5 g/dL (ref 30.0–36.0)
MCV: 83.7 fL (ref 80.0–100.0)
Platelets: 248 10*3/uL (ref 150–400)
RBC: 3.61 MIL/uL — ABNORMAL LOW (ref 3.87–5.11)
RDW: 20.7 % — ABNORMAL HIGH (ref 11.5–15.5)
WBC: 11.8 10*3/uL — ABNORMAL HIGH (ref 4.0–10.5)
nRBC: 0 % (ref 0.0–0.2)

## 2019-08-22 LAB — BASIC METABOLIC PANEL
Anion gap: 11 (ref 5–15)
BUN: 72 mg/dL — ABNORMAL HIGH (ref 8–23)
CO2: 24 mmol/L (ref 22–32)
Calcium: 8.1 mg/dL — ABNORMAL LOW (ref 8.9–10.3)
Chloride: 104 mmol/L (ref 98–111)
Creatinine, Ser: 2.34 mg/dL — ABNORMAL HIGH (ref 0.44–1.00)
GFR calc Af Amer: 22 mL/min — ABNORMAL LOW (ref >60)
GFR calc non Af Amer: 19 mL/min — ABNORMAL LOW (ref >60)
Glucose, Bld: 230 mg/dL — ABNORMAL HIGH (ref 70–99)
Potassium: 4.3 mmol/L (ref 3.5–5.1)
Sodium: 139 mmol/L (ref 135–145)

## 2019-08-22 LAB — GLUCOSE, CAPILLARY
Glucose-Capillary: 207 mg/dL — ABNORMAL HIGH (ref 70–99)
Glucose-Capillary: 227 mg/dL — ABNORMAL HIGH (ref 70–99)
Glucose-Capillary: 278 mg/dL — ABNORMAL HIGH (ref 70–99)
Glucose-Capillary: 255 mg/dL — ABNORMAL HIGH (ref 70–99)

## 2019-08-22 LAB — AMMONIA: Ammonia: 18 umol/L (ref 9–35)

## 2019-08-22 MED ORDER — CARVEDILOL 3.125 MG PO TABS: 3.1250 mg | ORAL_TABLET | Freq: Two times a day (BID) | ORAL | Status: DC

## 2019-08-22 MED ORDER — NITROGLYCERIN 0.4 MG SL SUBL: 0.4000 mg | SUBLINGUAL_TABLET | SUBLINGUAL | Status: DC | PRN

## 2019-08-22 MED ORDER — OXYCODONE-ACETAMINOPHEN 5-325 MG PO TABS: 1.0000 | ORAL_TABLET | Freq: Four times a day (QID) | ORAL | Status: DC | PRN

## 2019-08-22 MED ORDER — HYDRALAZINE HCL 25 MG PO TABS: 25.0000 mg | ORAL_TABLET | Freq: Three times a day (TID) | ORAL | Status: DC | PRN

## 2019-08-22 MED ORDER — PANTOPRAZOLE SODIUM 40 MG PO TBEC
40.0000 mg | DELAYED_RELEASE_TABLET | Freq: Every day | ORAL | Status: DC
  Administered 2019-08-22 – 2019-08-29 (×8): 40 mg via ORAL
  Filled 2019-08-22 (×8): qty 1

## 2019-08-22 MED ORDER — FLUTICASONE PROPIONATE 50 MCG/ACT NA SUSP
2.0000 | Freq: Every day | NASAL | Status: DC
  Administered 2019-08-23 – 2019-08-29 (×7): 2 via NASAL
  Filled 2019-08-22: qty 16

## 2019-08-22 MED ORDER — OXYCODONE HCL 5 MG PO TABS
5.0000 mg | ORAL_TABLET | Freq: Four times a day (QID) | ORAL | Status: DC | PRN
  Administered 2019-08-23 – 2019-08-29 (×22): 5 mg via ORAL
  Filled 2019-08-22 (×22): qty 1

## 2019-08-22 MED ORDER — LORATADINE 10 MG PO TABS
10.0000 mg | ORAL_TABLET | Freq: Every day | ORAL | Status: DC
  Administered 2019-08-22 – 2019-08-29 (×8): 10 mg via ORAL
  Filled 2019-08-22 (×8): qty 1

## 2019-08-22 MED ORDER — FE FUMARATE-B12-VIT C-FA-IFC PO CAPS
1.0000 | ORAL_CAPSULE | Freq: Two times a day (BID) | ORAL | Status: DC
  Administered 2019-08-22 – 2019-08-29 (×14): 1 via ORAL
  Filled 2019-08-22 (×15): qty 1

## 2019-08-22 MED ORDER — INSULIN DETEMIR 100 UNIT/ML ~~LOC~~ SOLN
14.0000 [IU] | Freq: Every day | SUBCUTANEOUS | Status: DC
  Administered 2019-08-22 – 2019-08-28 (×7): 14 [IU] via SUBCUTANEOUS
  Filled 2019-08-22 (×8): qty 0.14

## 2019-08-22 MED ORDER — VITAMIN D 25 MCG (1000 UNIT) PO TABS
2000.0000 [IU] | ORAL_TABLET | Freq: Every day | ORAL | Status: DC
  Administered 2019-08-22 – 2019-08-29 (×8): 2000 [IU] via ORAL
  Filled 2019-08-22 (×8): qty 2

## 2019-08-22 NOTE — Consult Note (Signed)
[facility] Nurse wound consult note Reason for Consult: opened and unopened blisters on left hip Wound type: MARSI with multiple bullae Drainage (amount, consistency, odor) moderate to heavy serous Periwound: intact, with some edema Dressing procedure/placement/frequency:  Place Aquacel (or equivalent silver hydrofiber) over all "blisters" on the left hip, whether they have burst or not.  Cover with foam dressings.  Change daily.  IF the Aquacel has stuck to the areas, moisten it with saline BEFORE trying to remove it. Monitor the wound area(s) for worsening of condition such as: Signs/symptoms of infection,  Increase in size,  Development of or worsening of odor, Development of pain, or increased pain at the affected locations.  Notify the medical team if any of these develop.  Thank you for the consult.  Discussed plan of care with the patient.  [facility] nurse will not follow at this time.  Please re-consult the [place] team if needed.  Val Riles, RN, MSN, CWOCN, CNS-BC, pager [phone]

## 2019-08-22 NOTE — Progress Notes (Signed)
[facility]  ROUNDING NOTE   Subjective:  Patient with good urine output yesterday at 1.7 L. No new creatinine today. A bit more awake and alert today but still confused.   Objective:  Vital signs in last 24 hours:  Temp:  [97.8 F (36.6 C)-98.4 F (36.9 C)] 97.9 F (36.6 C) (11/04 0957) Pulse Rate:  [57-72] 72 (11/04 0957) Resp:  [17-20] 18 (11/04 0957) BP: (119-143)/(37-59) 143/45 (11/04 0957) SpO2:  [94 %-100 %] 98 % (11/04 0957) Weight:  [104.5 kg] 104.5 kg (11/03 1606)  Weight change: 13.7 kg Filed Weights   08/20/19 1539 08/21/19 1606  Weight: 90.7 kg 104.5 kg    Intake/Output: I/O last 3 completed shifts: In: 1033.9 [P.O.:390; I.V.:443.7; IV Piggyback:200.2] Out: 3000 [Urine:3000]   Intake/Output this shift:  No intake/output data recorded.  Physical Exam: General: No acute distress  Head: Normocephalic, atraumatic. Moist oral mucosal membranes  Eyes: Anicteric  Neck: Supple, trachea midline  Lungs:  Clear to auscultation, normal effort  Heart: S1S2 no rubs  Abdomen:  Soft, nontender, bowel sounds present  Extremities: 2+ peripheral edema.  Neurologic: Awake, alert, and confused at times.  Skin: No lesions       Basic Metabolic Panel: Recent Labs  Lab 08/20/19 1543 08/20/19 2056 08/21/19 0513  NA 126* 128* 131*  K 7.0* 5.2* 4.9  CL 90* 91* 94*  CO2 _0 GLUCOSE 116* 170* 128*  BUN 97* 94* 100*  CREATININE 5.21* 4.56* 4.47*  CALCIUM 8.3* 8.4* 8.3*  MG 3.2*  --   --   PHOS  --  6.1* 6.5*    Liver Function Tests: Recent Labs  Lab 08/20/19 1543 08/20/19 2056 08/21/19 0513  AST 233*  --  150*  ALT 424*  --  323*  ALKPHOS 160*  --  136*  BILITOT 1.8*  --  1.8*  PROT 5.7*  --  5.2*  ALBUMIN 2.8* 2.6* 2.5*  2.6*   No results for input(s): LIPASE, AMYLASE in the last 168 hours. No results for input(s): AMMONIA in the last 168 hours.  CBC: Recent Labs  Lab 08/20/19 1543 08/21/19 0513  WBC 16.3* 12.1*  NEUTROABS  12.2*  --   HGB 9.6* 8.9*  HCT 31.2* 28.7*  MCV 82.5 81.1  PLT 265 223    Cardiac Enzymes: No results for input(s): CKTOTAL, CKMB, CKMBINDEX, TROPONINI in the last 168 hours.  BNP: Invalid input(s): POCBNP  CBG: Recent Labs  Lab 08/21/19 0752 08/21/19 1211 08/21/19 1641 08/21/19 2145 08/22/19 0957  GLUCAP 123* 173* 223* 204* 207*    Microbiology: Results for orders placed or performed during the hospital encounter of 08/20/19  Urine culture     Status: None   Collection Time: 08/20/19  5:19 PM   Specimen: In/Out Cath Urine  Result Value Ref Range Status   Specimen Description   Final    IN/OUT CATH URINE Performed at [facility], [address] [place], [place] [address]    Special Requests   Final    NONE Performed at [facility], [address] [place], [place] [address]    Culture   Final    NO GROWTH Performed at [facility], [place] [address], [place], [place] [address]    Report Status 08/21/2019 FINAL  Final  Culture, blood (routine x 2)     Status: None (Preliminary result)   Collection Time: 08/20/19  5:21 PM   Specimen: BLOOD  Result Value Ref Range  Status   Specimen Description BLOOD RIGHT ANTECUBITAL  Final   Special Requests   Final    BOTTLES DRAWN AEROBIC AND ANAEROBIC Blood Culture adequate volume   Culture   Final    NO GROWTH 2 DAYS Performed at [facility], [address], [place], [place] [address]    Report Status PENDING  Incomplete  SARS Coronavirus 2 by RT PCR (hospital order, performed in [facility] hospital lab) Nasopharyngeal Nasopharyngeal Swab     Status: None   Collection Time: 08/20/19  5:47 PM   Specimen: Nasopharyngeal Swab  Result Value Ref Range Status   SARS Coronavirus 2 NEGATIVE NEGATIVE Final    Comment: (NOTE) If result is NEGATIVE SARS-CoV-2 target nucleic acids are NOT DETECTED. The SARS-CoV-2 RNA is generally detectable in upper and lower  respiratory specimens during the  acute phase of infection. The lowest  concentration of SARS-CoV-2 viral copies this assay can detect is 250  copies / mL. A negative result does not preclude SARS-CoV-2 infection  and should not be used as the sole basis for treatment or other  patient management decisions.  A negative result may occur with  improper specimen collection / handling, submission of specimen other  than nasopharyngeal swab, presence of viral mutation(s) within the  areas targeted by this assay, and inadequate number of viral copies  (<250 copies / mL). A negative result must be combined with clinical  observations, patient history, and epidemiological information. If result is POSITIVE SARS-CoV-2 target nucleic acids are DETECTED. The SARS-CoV-2 RNA is generally detectable in upper and lower  respiratory specimens dur ing the acute phase of infection.  Positive  results are indicative of active infection with SARS-CoV-2.  Clinical  correlation with patient history and other diagnostic information is  necessary to determine patient infection status.  Positive results do  not rule out bacterial infection or co-infection with other viruses. If result is PRESUMPTIVE POSTIVE SARS-CoV-2 nucleic acids MAY BE PRESENT.   A presumptive positive result was obtained on the submitted specimen  and confirmed on repeat testing.  While 2019 novel coronavirus  (SARS-CoV-2) nucleic acids may be present in the submitted sample  additional confirmatory testing may be necessary for epidemiological  and / or clinical management purposes  to differentiate between  SARS-CoV-2 and other Sarbecovirus currently known to infect humans.  If clinically indicated additional testing with an alternate test  methodology [phone]) is advised. The SARS-CoV-2 RNA is generally  detectable in upper and lower respiratory sp ecimens during the acute  phase of infection. The expected result is Negative. Fact Sheet for Patients:   StrictlyIdeas.no Fact Sheet for Healthcare Providers: BankingDealers.co.za This test is not yet approved or cleared by the [place] FDA and has been authorized for detection and/or diagnosis of SARS-CoV-2 by FDA under an Emergency Use Authorization (EUA).  This EUA will remain in effect (meaning this test can be used) for the duration of the COVID-19 declaration under Section 564(b)(1) of the Act, 21 U.S.C. section 360bbb-3(b)(1), unless the authorization is terminated or revoked sooner. Performed at [facility], [address] [place], [place] [address]     Coagulation Studies: No results for input(s): LABPROT, INR in the last 72 hours.  Urinalysis: Recent Labs    08/20/19 1747  COLORURINE YELLOW*  LABSPEC 1.013  PHURINE 5.0  GLUCOSEU NEGATIVE  HGBUR NEGATIVE  BILIRUBINUR NEGATIVE  KETONESUR NEGATIVE  PROTEINUR NEGATIVE  NITRITE NEGATIVE  LEUKOCYTESUR NEGATIVE      Imaging: US  Renal  Result Date: 08/21/2019 CLINICAL DATA:  Acute renal failure EXAM: RENAL / URINARY TRACT ULTRASOUND COMPLETE COMPARISON:  None. FINDINGS: Right Kidney: Renal measurements: 10.9 x 4.3 x 5.5 cm = volume: 135.7 mL . Echogenicity and renal cortical thickness are within normal limits. No mass or hydronephrosis visualized. There is trace perinephric fluid on the right. No sonographically demonstrable calculus or ureterectasis. Left Kidney: Renal measurements: 10.5 x 6.0 x 5.2 cm = volume: 170.6 mL. Echogenicity and renal cortical thickness are within normal limits. No mass, perinephric fluid, or hydronephrosis visualized. No sonographically demonstrable calculus or ureterectasis. Bladder: Foley catheter in place. A small amount of urine remains in the bladder. Bladder appears grossly normal with Foley in place. Other: None. IMPRESSION: Slight amount perinephric fluid on the right. Kidneys otherwise appear normal bilaterally. Foley catheter in  place with small amount of urine remaining in the urinary bladder. Electronically Signed   By: Lowella Grip III M.D.   On: 08/21/2019 07:21   Dg Chest Portable 1 View  Result Date: 08/20/2019 CLINICAL DATA:  Altered mental status, weakness EXAM: PORTABLE CHEST 1 VIEW COMPARISON:  None. FINDINGS: The heart size and mediastinal contours are within normal limits. Calcific aortic knob. Coronary artery calcification. No focal airspace consolidation, pleural effusion, or pneumothorax. IMPRESSION: No acute cardiopulmonary findings. Electronically Signed   By: Davina Poke M.D.   On: 08/20/2019 15:57     Medications:   . sodium chloride 75 mL/hr at 08/21/19 2334   . aspirin EC  81 mg Oral Daily  . Chlorhexidine Gluconate Cloth  6 each Topical Daily  . clopidogrel  75 mg Oral Daily  . heparin  5,000 Units Subcutaneous Q8H  . insulin aspart  0-9 Units Subcutaneous TID WC  . insulin detemir  10 Units Subcutaneous QHS  . isosorbide mononitrate  30 mg Oral BID  . levothyroxine  125 mcg Oral Daily  . sodium chloride flush  3 mL Intravenous Q12H  . umeclidinium-vilanterol  1 puff Inhalation Daily   acetaminophen **OR** acetaminophen, albuterol, HYDROmorphone (DILAUDID) injection, ondansetron **OR** ondansetron (ZOFRAN) IV  Assessment/ Plan:  78 y.o. female with a PMHx of COPD, diabetes mellitus type 2, pulmonary hypertension, tricuspid regurgitation, congestive heart failure, recent left hip fracture, who was admitted to [facility] on 08/20/2019 for evaluation of altered mental status.   1.  Acute kidney injury/chronic kidney disease stage IIIa.  Baseline EGFR 45.    No new renal function testing today.  We will reorder renal function panel today.  For now maintain the patient on 0.9 normal saline at 75 cc/h.  No urgent indication for dialysis as the patient is making urine and made 1.7 L of urine over the past 24 hours.  2.  Hyponatremia.  Repeat serum sodium today.  3.  Anemia of chronic kidney  disease.  Hemoglobin 8.9 at last check.  Continue to periodically monitor CBC.   LOS: 2 Breniyah Romm 11/4/202010:12 AM

## 2019-08-22 NOTE — Progress Notes (Signed)
Dressing change done per wound care order. Aquacel (or equivalent silver hydrofiber) over all "blisters" on the left hip,   Covered with foam dressings. Pt tolerated well.

## 2019-08-22 NOTE — Progress Notes (Signed)
Talked to patient's daughter Lattie Haw and updated her about the plan of care. Also asked Dr. Blaine Hamper if she can speak to her daughter to also update her and to answer her questions. Asked if we can have a better pain medication other than a tylenol, patient is allergic to codein but per daughter she takes percocet at home, MD place order. RN will continue to monitor.

## 2019-08-22 NOTE — Progress Notes (Signed)
PROGRESS NOTE    Destiny Zuniga  YHC:[id number] DOB: 30-Jul-1941 DOA: 08/20/2019 PCP: Kirk Ruths, MD    Brief Narrative:   Destiny Zuniga is an 78 y.o. female with history significant for COPD, type II DM, pulmonary hypertension, tricuspid regurgitation, CHF, recent left hip fracture was brought to the hospital because of altered mental status.  She was found to have acute kidney injury with creatinine of 5.21.  Baseline creatinine is around 1.15.   Interim History: 1. Cre is improving 5.21 -->4.47-->2.34 today. US-renal has no hydronephrosis 2. US-liver: Normal liver Doppler ultrasound. 3. Has left hip pain which is likely from recent left hip surgery (ORIF)    Assessment & Plan:   Principal Problem:   Acute renal failure superimposed on stage 3a chronic kidney disease (HCC) Active Problems:   Hip fracture (HCC)   COPD (chronic obstructive pulmonary disease) (HCC)   Type II diabetes mellitus with renal manifestations (HCC)   Hyperlipidemia   Hyperkalemia   Coronary artery disease   Chronic diastolic CHF (congestive heart failure) (HCC)   Hypothyroidism   Hypertension associated with diabetes (HCC)   Elevated liver enzymes  Acute renal failure superimposed on stage 3a chronic kidney disease ([place]): improving. Cre is improving 5.21 -->4.47-->2.34  today. US-renal has no hydronephrosis. Consulted nephrologist to assist with management. Per Dr. Holley Raring, no urgent indication for dialysis at the moment. -will continue normal saline at 75 cc/h.  -hold lasix, cozarr -avoid NSAIDS. -chech CMP in AM which is also for abnormal LFT  Hyperkalemia: resovlve. K 4.9 today  Hyponatremia: Likely hypovolemic hyponatremia.  Sodium level is improving. Na 131.  Altered mental status/metabolic encephalopathy: still intermittently confused. -will get CT-head  -check ammonia level -Frequent neuro check  Leukocytosis: no fever. Improving. 12.1-->11.8. No clear  evidence of infection at this time. This may be reactive. -f/u by CBC -Follow-up cultures -->so far negative bx and Ux  Chronic diastolic CHF: Lasix has been held.  2D echo on 10/05/2018 showed EF of 60 to 65%.  Patient has leg edema.  She has  mild shortness of breath, but dose not seem to have of acute CHFexacerbation. -Hold Lasix due to worsening renal function  CAD status post PCI: no chest pain -Continue aspirin, Plavix, Imdur   -Hold simvastatin because of elevated liver enzymes.   - Coreg is held because of sinus bradycardia  Elevated troponins: Trop 14, 110, 85. This is probably from demand ischemia/acute kidney injury.  No complaints of chest pain or shortness of breath.  Elevated liver enzymes: Etiology unclear. Liver US is negative. Hepatitis panel negative. -check ammonia level -check CMP. If worsening, will ask GI to see pt tomorrow  Hepatic Function Latest Ref Rng & Units 08/21/2019 08/21/2019 08/20/2019  Total Protein 6.5 - 8.1 g/dL - 5.2(L) -  Albumin 3.5 - 5.0 g/dL 2.5(L) 2.6(L) 2.6(L)  AST 15 - 41 U/L - 150(H) -  ALT 0 - 44 U/L - 323(H) -  Alk Phosphatase 38 - 126 U/L - 136(H) -  Total Bilirubin 0.3 - 1.2 mg/dL - 1.8(H) -  Bilirubin, Direct 0.0 - 0.2 mg/dL - 0.6(H) -    Sinus bradycardia: Heart rate been stable between 50-60 beats per minutes. -hold Coreg  Hypertension: coreg, Lasix and losartan have been held. Bp 148/80 -prn oral hydralazine orally   Type II diabetes mellitus with renal manifestations:  -Continue Levemir -SSI    COPD and chronic hypoxemic respiratory failure: stab;e -Continue bronchodilators and oxygen via nasal cannula  Recent left hip fracture status post ORIF on 08/11/2019: -Prn oxycodone   DVT prophylaxis: Heparin Code Status: Full code Family Communication: None Disposition Plan: Discharge to SNF in 3 to 4 days Barriers for discharge: abnormal LFT, renal fx not back to baseline yet.    Consultants:    renal  Procedures:  US-renal  US-liver  Antimicrobials:    Subjective:   Has mild SOB, no CP, nausea, vomiting, abdominal pain or diarrhea.  No fever or chills.  Objective: Vitals:   08/21/19 1945 08/22/19 0441 08/22/19 0957 08/22/19 1645  BP:  (!) 135/45 (!) 143/45 (!) 133/49  Pulse:  69 72 69  Resp:  '20 18 19  ' Temp: 98.4 F (36.9 C) 98.3 F (36.8 C) 97.9 F (36.6 C) 98 F (36.7 C)  TempSrc: Oral Oral Oral Oral  SpO2:  94% 98% 95%  Weight:      Height:        Intake/Output Summary (Last 24 hours) at 08/22/2019 1852 Last data filed at 08/22/2019 1700 Gross per 24 hour  Intake 638.43 ml  Output 2300 ml  Net -1661.57 ml   Filed Weights   08/20/19 1539 08/21/19 1606  Weight: 90.7 kg 104.5 kg    Examination: Physical Exam:  General: Not in acute distress HEENT: PERRL, EOMI, no scleral icterus, No JVD or bruit Cardiac: S1/S2, RRR, No murmurs, gallops or rubs Pulm: Clear to auscultation bilaterally. No rales, wheezing, rhonchi or rubs. Abd: Soft, nondistended, nontender, no rebound pain, no organomegaly, BS present Ext: 1+ leg edema bilaterally. 2+DP/PT pulse bilaterally Musculoskeletal: No joint deformities, erythema, or stiffness, ROM full Skin: No rashes.  Neuro: Intermittently confused, currently oriented X3, cranial nerves II-XII grossly intact, moves all extremities.  Psych: Patient is not psychotic, no suicidal or hemocidal ideation.    Data Reviewed: I have personally reviewed following labs and imaging studies  CBC: Recent Labs  Lab 08/20/19 1543 08/21/19 0513 08/22/19 1008  WBC 16.3* 12.1* 11.8*  NEUTROABS 12.2*  --   --   HGB 9.6* 8.9* 9.2*  HCT 31.2* 28.7* 30.2*  MCV 82.5 81.1 83.7  PLT 265 223 597   Basic Metabolic Panel: Recent Labs  Lab 08/20/19 1543 08/20/19 2056 08/21/19 0513 08/22/19 1008  NA 126* 128* 131* 139  K 7.0* 5.2* 4.9 4.3  CL 90* 91* 94* 104  CO2 '24 23 24 24  ' GLUCOSE 116* 170* 128* 230*  BUN 97* 94* 100*  72*  CREATININE 5.21* 4.56* 4.47* 2.34*  CALCIUM 8.3* 8.4* 8.3* 8.1*  MG 3.2*  --   --   --   PHOS  --  6.1* 6.5*  --    GFR: Estimated Creatinine Clearance: 22.1 mL/min (A) (by C-G formula based on SCr of 2.34 mg/dL (H)). Liver Function Tests: Recent Labs  Lab 08/20/19 1543 08/20/19 2056 08/21/19 0513  AST 233*  --  150*  ALT 424*  --  323*  ALKPHOS 160*  --  136*  BILITOT 1.8*  --  1.8*  PROT 5.7*  --  5.2*  ALBUMIN 2.8* 2.6* 2.5*   2.6*   No results for input(s): LIPASE, AMYLASE in the last 168 hours. No results for input(s): AMMONIA in the last 168 hours. Coagulation Profile: No results for input(s): INR, PROTIME in the last 168 hours. Cardiac Enzymes: No results for input(s): CKTOTAL, CKMB, CKMBINDEX, TROPONINI in the last 168 hours. BNP (last 3 results) No results for input(s): PROBNP in the last 8760 hours. HbA1C: No results for input(s): HGBA1C  in the last 72 hours. CBG: Recent Labs  Lab 08/21/19 1641 08/21/19 2145 08/22/19 0957 08/22/19 1136 08/22/19 1647  GLUCAP 223* 204* 207* 227* 278*   Lipid Profile: No results for input(s): CHOL, HDL, LDLCALC, TRIG, CHOLHDL, LDLDIRECT in the last 72 hours. Thyroid Function Tests: Recent Labs    08/20/19 1543  TSH 3.708   Anemia Panel: No results for input(s): VITAMINB12, FOLATE, FERRITIN, TIBC, IRON, RETICCTPCT in the last 72 hours. Sepsis Labs: Recent Labs  Lab 08/20/19 2035  LATICACIDVEN 1.3    Recent Results (from the past 240 hour(s))  Urine culture     Status: None   Collection Time: 08/20/19  5:19 PM   Specimen: In/Out Cath Urine  Result Value Ref Range Status   Specimen Description   Final    IN/OUT CATH URINE Performed at [facility], [address], [place], [place] [address]    Special Requests   Final    NONE Performed at [facility], [address], [place], [place] [address]    Culture   Final    NO GROWTH Performed at [facility], [place] [address]  [place], [place] [address]    Report Status 08/21/2019 FINAL  Final  Culture, blood (routine x 2)     Status: None (Preliminary result)   Collection Time: 08/20/19  5:21 PM   Specimen: BLOOD  Result Value Ref Range Status   Specimen Description BLOOD RIGHT ANTECUBITAL  Final   Special Requests   Final    BOTTLES DRAWN AEROBIC AND ANAEROBIC Blood Culture adequate volume   Culture   Final    NO GROWTH 2 DAYS Performed at [facility], [address], [place], [place] [address]    Report Status PENDING  Incomplete  SARS Coronavirus 2 by RT PCR (hospital order, performed in [place] hospital lab) Nasopharyngeal Nasopharyngeal Swab     Status: None   Collection Time: 08/20/19  5:47 PM   Specimen: Nasopharyngeal Swab  Result Value Ref Range Status   SARS Coronavirus 2 NEGATIVE NEGATIVE Final    Comment: (NOTE) If result is NEGATIVE SARS-CoV-2 target nucleic acids are NOT DETECTED. The SARS-CoV-2 RNA is generally detectable in upper and lower  respiratory specimens during the acute phase of infection. The lowest  concentration of SARS-CoV-2 viral copies this assay can detect is 250  copies / mL. A negative result does not preclude SARS-CoV-2 infection  and should not be used as the sole basis for treatment or other  patient management decisions.  A negative result may occur with  improper specimen collection / handling, submission of specimen other  than nasopharyngeal swab, presence of viral mutation(s) within the  areas targeted by this assay, and inadequate number of viral copies  (<250 copies / mL). A negative result must be combined with clinical  observations, patient history, and epidemiological information. If result is POSITIVE SARS-CoV-2 target nucleic acids are DETECTED. The SARS-CoV-2 RNA is generally detectable in upper and lower  respiratory specimens dur ing the acute phase of infection.  Positive  results are indicative of active infection with SARS-CoV-2.   Clinical  correlation with patient history and other diagnostic information is  necessary to determine patient infection status.  Positive results do  not rule out bacterial infection or co-infection with other viruses. If result is PRESUMPTIVE POSTIVE SARS-CoV-2 nucleic acids MAY BE PRESENT.   A presumptive positive result was obtained on the submitted specimen  and confirmed on repeat testing.  While 2019 novel coronavirus  (SARS-CoV-2) nucleic acids may be present in the submitted sample  additional confirmatory testing may be necessary for epidemiological  and / or clinical management purposes  to differentiate between  SARS-CoV-2 and other Sarbecovirus currently known to infect humans.  If clinically indicated additional testing with an alternate test  methodology [phone]) is advised. The SARS-CoV-2 RNA is generally  detectable in upper and lower respiratory sp ecimens during the acute  phase of infection. The expected result is Negative. Fact Sheet for Patients:  StrictlyIdeas.no Fact Sheet for Healthcare Providers: BankingDealers.co.za This test is not yet approved or cleared by the [place] FDA and has been authorized for detection and/or diagnosis of SARS-CoV-2 by FDA under an Emergency Use Authorization (EUA).  This EUA will remain in effect (meaning this test can be used) for the duration of the COVID-19 declaration under Section 564(b)(1) of the Act, 21 U.S.C. section 360bbb-3(b)(1), unless the authorization is terminated or revoked sooner. Performed at [facility], [address] [place], [place] [address]       Radiology Studies: US Renal  Result Date: 08/21/2019 CLINICAL DATA:  Acute renal failure EXAM: RENAL / URINARY TRACT ULTRASOUND COMPLETE COMPARISON:  None. FINDINGS: Right Kidney: Renal measurements: 10.9 x 4.3 x 5.5 cm = volume: 135.7 mL . Echogenicity and renal cortical thickness are within  normal limits. No mass or hydronephrosis visualized. There is trace perinephric fluid on the right. No sonographically demonstrable calculus or ureterectasis. Left Kidney: Renal measurements: 10.5 x 6.0 x 5.2 cm = volume: 170.6 mL. Echogenicity and renal cortical thickness are within normal limits. No mass, perinephric fluid, or hydronephrosis visualized. No sonographically demonstrable calculus or ureterectasis. Bladder: Foley catheter in place. A small amount of urine remains in the bladder. Bladder appears grossly normal with Foley in place. Other: None. IMPRESSION: Slight amount perinephric fluid on the right. Kidneys otherwise appear normal bilaterally. Foley catheter in place with small amount of urine remaining in the urinary bladder. Electronically Signed   By: Lowella Grip III M.D.   On: 08/21/2019 07:21   US Liver Doppler  Result Date: 08/22/2019 CLINICAL DATA:  78 year old female with elevated liver enzymes EXAM: DUPLEX ULTRASOUND OF LIVER TECHNIQUE: Color and duplex Doppler ultrasound was performed to evaluate the hepatic in-flow and out-flow vessels. COMPARISON:  None. FINDINGS: Liver: Normal parenchymal echogenicity. Normal hepatic contour without nodularity. No focal lesion, mass or intrahepatic biliary ductal dilatation. Main Portal Vein size: 0.9 cm Portal Vein Velocities Main Prox:  62 cm/sec with normal hepatopetal directional flow. Main Mid: 42 cm/sec with normal hepatopetal directional flow. Main Dist:  38 cm/sec with normal hepatopetal directional flow. Right: 48 cm/sec with normal hepatopetal directional flow. Left: 21 cm/sec with normal hepatopetal directional flow. Hepatic Vein Velocities Right:  87 cm/sec Middle:  41 cm/sec Left:  75 cm/sec IVC: Present and patent with normal respiratory phasicity. Hepatic Artery Velocity:  78 cm/sec Splenic Vein Velocity:  37 cm/sec Spleen: 11 cm x 4.3 cm x 11.2 cm with a total volume of 277 cm^3 (411 cm^3 is upper limit normal) Portal Vein  Occlusion/Thrombus: No Splenic Vein Occlusion/Thrombus: No Ascites: None Varices: None Other: Small bilateral pleural effusions are noted incidentally. IMPRESSION: Normal liver Doppler ultrasound. Trace bilateral pleural effusions noted incidentally. Electronically Signed   By: Jacqulynn Cadet M.D.   On: 08/22/2019 10:55        Scheduled Meds:  aspirin EC  81 mg Oral Daily   Chlorhexidine Gluconate Cloth  6 each Topical  Daily   cholecalciferol  2,000 Units Oral Daily   clopidogrel  75 mg Oral Daily   ferrous [id number] C-folic acid  1 capsule Oral BID PC   fluticasone  2 spray Each Nare Daily   heparin  5,000 Units Subcutaneous Q8H   insulin aspart  0-9 Units Subcutaneous TID WC   insulin detemir  14 Units Subcutaneous QHS   isosorbide mononitrate  30 mg Oral BID   levothyroxine  125 mcg Oral Daily   loratadine  10 mg Oral Daily   pantoprazole  40 mg Oral Daily   sodium chloride flush  3 mL Intravenous Q12H   umeclidinium-vilanterol  1 puff Inhalation Daily   Continuous Infusions:  sodium chloride 20 mL/hr at 08/22/19 1232     LOS: 2 days    Time spent: 30 min    Ivor Costa, DO [facility] PAGER is on AMION  If 7PM-7AM, please contact night-coverage [email or web address] Password [facility] 08/22/2019, 6:52 PM

## 2019-08-22 NOTE — Progress Notes (Addendum)
Ch received a referral to visit with pt. Pt was lying in bed with a flat affect. Pt c/o having pain related to hip injury and a headache but also shared that she had been given medicine for it recently. Pt shared that she injured herself while trying to use a public restroom recently. Pt shared that she went to rehab post-op but now has been admitted for renal failure/altered mental status. Ch provided words of comfort and support as the pt expressed her grief and longing to reconnect with her family that lives in various places. Pt does has a significant other that has been visiting while she has been admitted. Ch provided space for pt to reflect on her life and the struggles that she had to overcome concerning her grandchildren. Pt also shared how she was so tired of being in pain. Ch discerned that the pt was in both physical distress and emotional pain. Ch read scripture to pt from Ps 37 and prayed with her as the pt became tearful.  F/u support recommended.    08/22/19 1100  Clinical Encounter Type  Visited With Patient  Visit Type Psychological support;Spiritual support;Social support  Referral From Care management;Nurse  Consult/Referral To Chaplain  Spiritual Encounters  Spiritual Needs Emotional;Grief support;Prayer;Sacred text  Stress Factors  Patient Stress Factors Health changes;Loss of control;Major life changes  Family Stress Factors Major life changes

## 2019-08-23 LAB — CBC WITH DIFFERENTIAL/PLATELET
Abs Immature Granulocytes: 0.08 10*3/uL — ABNORMAL HIGH (ref 0.00–0.07)
Basophils Absolute: 0 10*3/uL (ref 0.0–0.1)
Basophils Relative: 0 %
Eosinophils Absolute: 0.2 10*3/uL (ref 0.0–0.5)
Eosinophils Relative: 3 %
HCT: 27.8 % — ABNORMAL LOW (ref 36.0–46.0)
Hemoglobin: 8.4 g/dL — ABNORMAL LOW (ref 12.0–15.0)
Immature Granulocytes: 1 %
Lymphocytes Relative: 13 %
Lymphs Abs: 1.3 10*3/uL (ref 0.7–4.0)
MCH: 25.4 pg — ABNORMAL LOW (ref 26.0–34.0)
MCHC: 30.2 g/dL (ref 30.0–36.0)
MCV: 84 fL (ref 80.0–100.0)
Monocytes Absolute: 0.9 10*3/uL (ref 0.1–1.0)
Monocytes Relative: 10 %
Neutro Abs: 7 10*3/uL (ref 1.7–7.7)
Neutrophils Relative %: 73 %
Platelets: 225 10*3/uL (ref 150–400)
RBC: 3.31 MIL/uL — ABNORMAL LOW (ref 3.87–5.11)
RDW: 21 % — ABNORMAL HIGH (ref 11.5–15.5)
WBC: 9.6 10*3/uL (ref 4.0–10.5)
nRBC: 0 % (ref 0.0–0.2)

## 2019-08-23 LAB — COMPREHENSIVE METABOLIC PANEL
ALT: 174 U/L — ABNORMAL HIGH (ref 0–44)
AST: 47 U/L — ABNORMAL HIGH (ref 15–41)
Albumin: 2.4 g/dL — ABNORMAL LOW (ref 3.5–5.0)
Alkaline Phosphatase: 123 U/L (ref 38–126)
Anion gap: 9 (ref 5–15)
BUN: 50 mg/dL — ABNORMAL HIGH (ref 8–23)
CO2: 23 mmol/L (ref 22–32)
Calcium: 8.3 mg/dL — ABNORMAL LOW (ref 8.9–10.3)
Chloride: 105 mmol/L (ref 98–111)
Creatinine, Ser: 1.4 mg/dL — ABNORMAL HIGH (ref 0.44–1.00)
GFR calc Af Amer: 41 mL/min — ABNORMAL LOW (ref >60)
GFR calc non Af Amer: 36 mL/min — ABNORMAL LOW (ref >60)
Glucose, Bld: 212 mg/dL — ABNORMAL HIGH (ref 70–99)
Potassium: 4 mmol/L (ref 3.5–5.1)
Sodium: 137 mmol/L (ref 135–145)
Total Bilirubin: 1.5 mg/dL — ABNORMAL HIGH (ref 0.3–1.2)
Total Protein: 5 g/dL — ABNORMAL LOW (ref 6.5–8.1)

## 2019-08-23 LAB — GLUCOSE, CAPILLARY
Glucose-Capillary: 192 mg/dL — ABNORMAL HIGH (ref 70–99)
Glucose-Capillary: 156 mg/dL — ABNORMAL HIGH (ref 70–99)
Glucose-Capillary: 196 mg/dL — ABNORMAL HIGH (ref 70–99)
Glucose-Capillary: 152 mg/dL — ABNORMAL HIGH (ref 70–99)

## 2019-08-23 LAB — PROTEIN ELECTROPHORESIS, SERUM
A/G Ratio: 1 (ref 0.7–1.7)
Albumin ELP: 2.5 g/dL — ABNORMAL LOW (ref 2.9–4.4)
Alpha-1-Globulin: 0.4 g/dL (ref 0.0–0.4)
Alpha-2-Globulin: 0.5 g/dL (ref 0.4–1.0)
Beta Globulin: 0.8 g/dL (ref 0.7–1.3)
Gamma Globulin: 0.8 g/dL (ref 0.4–1.8)
Globulin, Total: 2.6 g/dL (ref 2.2–3.9)
Total Protein ELP: 5.1 g/dL — ABNORMAL LOW (ref 6.0–8.5)

## 2019-08-23 LAB — ANA W/REFLEX IF POSITIVE: Anti Nuclear Antibody (ANA): NEGATIVE

## 2019-08-23 LAB — GLOMERULAR BASEMENT MEMBRANE ANTIBODIES: GBM Ab: 3 units (ref 0–20)

## 2019-08-23 LAB — MAGNESIUM: Magnesium: 2.1 mg/dL (ref 1.7–2.4)

## 2019-08-23 LAB — C4 COMPLEMENT: Complement C4, Body Fluid: 15 mg/dL (ref 12–38)

## 2019-08-23 LAB — MPO/PR-3 (ANCA) ANTIBODIES
ANCA Proteinase 3: <3.5 U/mL (ref 0.0–3.5)
Myeloperoxidase Abs: <9 U/mL (ref 0.0–9.0)

## 2019-08-23 LAB — C3 COMPLEMENT: C3 Complement: 105 mg/dL (ref 82–167)

## 2019-08-23 MED ORDER — LABETALOL HCL 5 MG/ML IV SOLN
5.0000 mg | Freq: Once | INTRAVENOUS | Status: AC
  Administered 2019-08-23: 5 mg via INTRAVENOUS
  Filled 2019-08-23: qty 4

## 2019-08-23 MED ORDER — METOPROLOL TARTRATE 5 MG/5ML IV SOLN: 2.5000 mg | Freq: Once | INTRAVENOUS | Status: DC

## 2019-08-23 MED ORDER — CARVEDILOL 3.125 MG PO TABS
3.1250 mg | ORAL_TABLET | Freq: Two times a day (BID) | ORAL | Status: DC
  Administered 2019-08-23 – 2019-08-25 (×5): 3.125 mg via ORAL
  Filled 2019-08-23 (×5): qty 1

## 2019-08-23 NOTE — Progress Notes (Signed)
Patient with small nosebleed this morning, refuses her aspirin, states it causes nosebleeds, given cool cloth and tissues. Will continue to monitor.

## 2019-08-23 NOTE — Care Management Important Message (Signed)
Important Message  Patient Details  Name: Destiny Zuniga MRN: [id number] Date of Birth: 1941/01/02   Medicare Important Message Given:  Yes     Dannette Barbara 08/23/2019, 1:25 PM

## 2019-08-23 NOTE — Progress Notes (Signed)
OT Cancellation Note  Patient Details Name: Destiny Zuniga MRN: [id number] DOB: 1941-09-19   Cancelled Treatment:    Reason Eval/Treat Not Completed: Other (comment). Consult received, chart reviewed. Pt HR 119 at rest per telemetry. Will hold OT evaluation at this time and re-attempt at later date/time as medically appropriate.   Jeni Salles, MPH, MS, OTR/L ascom [phone] 08/23/19, 4:39 PM

## 2019-08-23 NOTE — Progress Notes (Signed)
[facility]  ROUNDING NOTE   Subjective:  Patient much more awake and alert today. In a pleasant mood. Urine output was 3 L over the preceding 24 hours. Creatinine improved and down to 1.4.   Objective:  Vital signs in last 24 hours:  Temp:  [97.5 F (36.4 C)-98.2 F (36.8 C)] 97.5 F (36.4 C) (11/05 0740) Pulse Rate:  [65-101] 94 (11/05 0740) Resp:  [18-20] 18 (11/05 0740) BP: (130-150)/(49-76) 150/76 (11/05 0740) SpO2:  [95 %-98 %] 97 % (11/05 0740) Weight:  [103.3 kg] 103.3 kg (11/05 0448)  Weight change: -1.179 kg Filed Weights   08/20/19 1539 08/21/19 1606 08/23/19 0448  Weight: 90.7 kg 104.5 kg 103.3 kg    Intake/Output: I/O last 3 completed shifts: In: 2159.1 [I.V.:2159.1] Out: 3000 [Urine:3000]   Intake/Output this shift:  No intake/output data recorded.  Physical Exam: General: No acute distress  Head: Normocephalic, atraumatic. Moist oral mucosal membranes  Eyes: Anicteric  Neck: Supple, trachea midline  Lungs:  Clear to auscultation, normal effort  Heart: S1S2 no rubs  Abdomen:  Soft, nontender, bowel sounds present  Extremities: 2+ peripheral edema.  Neurologic: Awake, alert, conversant  Skin: No lesions       Basic Metabolic Panel: Recent Labs  Lab 08/20/19 1543 08/20/19 2056 08/21/19 0513 08/22/19 1008 08/23/19 0432  NA 126* 128* 131* 139 137  K 7.0* 5.2* 4.9 4.3 4.0  CL 90* 91* 94* 104 105  CO2 '24 23 24 24 23  ' GLUCOSE 116* 170* 128* 230* 212*  BUN 97* 94* 100* 72* 50*  CREATININE 5.21* 4.56* 4.47* 2.34* 1.40*  CALCIUM 8.3* 8.4* 8.3* 8.1* 8.3*  MG 3.2*  --   --   --  2.1  PHOS  --  6.1* 6.5*  --   --     Liver Function Tests: Recent Labs  Lab 08/20/19 1543 08/20/19 2056 08/21/19 0513 08/23/19 0432  AST 233*  --  150* 47*  ALT 424*  --  323* 174*  ALKPHOS 160*  --  136* 123  BILITOT 1.8*  --  1.8* 1.5*  PROT 5.7*  --  5.2* 5.0*  ALBUMIN 2.8* 2.6* 2.5*  2.6* 2.4*   No results for input(s): LIPASE, AMYLASE  in the last 168 hours. Recent Labs  Lab 08/22/19 1829  AMMONIA 18    CBC: Recent Labs  Lab 08/20/19 1543 08/21/19 0513 08/22/19 1008 08/23/19 0432  WBC 16.3* 12.1* 11.8* 9.6  NEUTROABS 12.2*  --   --  7.0  HGB 9.6* 8.9* 9.2* 8.4*  HCT 31.2* 28.7* 30.2* 27.8*  MCV 82.5 81.1 83.7 84.0  PLT 265 223 248 225    Cardiac Enzymes: No results for input(s): CKTOTAL, CKMB, CKMBINDEX, TROPONINI in the last 168 hours.  BNP: Invalid input(s): POCBNP  CBG: Recent Labs  Lab 08/22/19 0957 08/22/19 1136 08/22/19 1647 08/22/19 2041 08/23/19 0741  GLUCAP 207* 227* 278* 255* 192*    Microbiology: Results for orders placed or performed during the hospital encounter of 08/20/19  Urine culture     Status: None   Collection Time: 08/20/19  5:19 PM   Specimen: In/Out Cath Urine  Result Value Ref Range Status   Specimen Description   Final    IN/OUT CATH URINE Performed at [facility], [address] [place], [place] [address]    Special Requests   Final    NONE Performed at [facility], [address] [place], [place] [address]    Culture  Final    NO GROWTH Performed at [facility], [place] [address], [place], [place] [address]    Report Status 08/21/2019 FINAL  Final  Culture, blood (routine x 2)     Status: None (Preliminary result)   Collection Time: 08/20/19  5:21 PM   Specimen: BLOOD  Result Value Ref Range Status   Specimen Description BLOOD RIGHT ANTECUBITAL  Final   Special Requests   Final    BOTTLES DRAWN AEROBIC AND ANAEROBIC Blood Culture adequate volume   Culture   Final    NO GROWTH 3 DAYS Performed at [facility], [address] [place], [place] [address]    Report Status PENDING  Incomplete  SARS Coronavirus 2 by RT PCR (hospital order, performed in [place] hospital lab) Nasopharyngeal Nasopharyngeal Swab     Status: None   Collection Time: 08/20/19  5:47 PM   Specimen: Nasopharyngeal Swab  Result Value Ref  Range Status   SARS Coronavirus 2 NEGATIVE NEGATIVE Final    Comment: (NOTE) If result is NEGATIVE SARS-CoV-2 target nucleic acids are NOT DETECTED. The SARS-CoV-2 RNA is generally detectable in upper and lower  respiratory specimens during the acute phase of infection. The lowest  concentration of SARS-CoV-2 viral copies this assay can detect is 250  copies / mL. A negative result does not preclude SARS-CoV-2 infection  and should not be used as the sole basis for treatment or other  patient management decisions.  A negative result may occur with  improper specimen collection / handling, submission of specimen other  than nasopharyngeal swab, presence of viral mutation(s) within the  areas targeted by this assay, and inadequate number of viral copies  (<250 copies / mL). A negative result must be combined with clinical  observations, patient history, and epidemiological information. If result is POSITIVE SARS-CoV-2 target nucleic acids are DETECTED. The SARS-CoV-2 RNA is generally detectable in upper and lower  respiratory specimens dur ing the acute phase of infection.  Positive  results are indicative of active infection with SARS-CoV-2.  Clinical  correlation with patient history and other diagnostic information is  necessary to determine patient infection status.  Positive results do  not rule out bacterial infection or co-infection with other viruses. If result is PRESUMPTIVE POSTIVE SARS-CoV-2 nucleic acids MAY BE PRESENT.   A presumptive positive result was obtained on the submitted specimen  and confirmed on repeat testing.  While 2019 novel coronavirus  (SARS-CoV-2) nucleic acids may be present in the submitted sample  additional confirmatory testing may be necessary for epidemiological  and / or clinical management purposes  to differentiate between  SARS-CoV-2 and other Sarbecovirus currently known to infect humans.  If clinically indicated additional testing with an  alternate test  methodology [phone]) is advised. The SARS-CoV-2 RNA is generally  detectable in upper and lower respiratory sp ecimens during the acute  phase of infection. The expected result is Negative. Fact Sheet for Patients:  StrictlyIdeas.no Fact Sheet for Healthcare Providers: BankingDealers.co.za This test is not yet approved or cleared by the [place] FDA and has been authorized for detection and/or diagnosis of SARS-CoV-2 by FDA under an Emergency Use Authorization (EUA).  This EUA will remain in effect (meaning this test can be used) for the duration of the COVID-19 declaration under Section 564(b)(1) of the Act, 21 U.S.C. section 360bbb-3(b)(1), unless the authorization is terminated or revoked sooner. Performed at [facility], [address], [place], [place] [address]  Coagulation Studies: No results for input(s): LABPROT, INR in the last 72 hours.  Urinalysis: Recent Labs    08/20/19 1747  COLORURINE YELLOW*  LABSPEC 1.013  PHURINE 5.0  GLUCOSEU NEGATIVE  HGBUR NEGATIVE  BILIRUBINUR NEGATIVE  KETONESUR NEGATIVE  PROTEINUR NEGATIVE  NITRITE NEGATIVE  LEUKOCYTESUR NEGATIVE      Imaging: Ct Head Wo Contrast  Result Date: 08/22/2019 CLINICAL DATA:  Encephalopathy. EXAM: CT HEAD WITHOUT CONTRAST TECHNIQUE: Contiguous axial images were obtained from the base of the skull through the vertex without intravenous contrast. COMPARISON:  08/10/2019 FINDINGS: Brain: There is no evidence of acute infarct, intracranial hemorrhage, mass, midline shift, or extra-axial fluid collection. The ventricles and sulci are within normal limits for age. Hypodensities in the cerebral white matter bilaterally are unchanged and nonspecific but compatible with mild chronic small vessel ischemic disease. 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 Vascular: Calcified atherosclerosis at the  skull base. No hyperdense vessel. Skull: No fracture or focal osseous lesion. Sinuses/Orbits: Visualized paranasal sinuses and mastoid air cells are clear. Bilateral cataract extraction is noted. Other: None. IMPRESSION: 1. No evidence of acute intracranial abnormality. 2. Mild chronic small vessel ischemic disease. Electronically Signed   By: Logan Bores M.D.   On: 08/22/2019 19:05   US Liver Doppler  Result Date: 08/22/2019 CLINICAL DATA:  78 year old female with elevated liver enzymes EXAM: DUPLEX ULTRASOUND OF LIVER TECHNIQUE: Color and duplex Doppler ultrasound was performed to evaluate the hepatic in-flow and out-flow vessels. COMPARISON:  None. FINDINGS: Liver: Normal parenchymal echogenicity. Normal hepatic contour without nodularity. No focal lesion, mass or intrahepatic biliary ductal dilatation. Main Portal Vein size: 0.9 cm Portal Vein Velocities Main Prox:  62 cm/sec with normal hepatopetal directional flow. Main Mid: 42 cm/sec with normal hepatopetal directional flow. Main Dist:  38 cm/sec with normal hepatopetal directional flow. Right: 48 cm/sec with normal hepatopetal directional flow. Left: 21 cm/sec with normal hepatopetal directional flow. Hepatic Vein Velocities Right:  87 cm/sec Middle:  41 cm/sec Left:  75 cm/sec IVC: Present and patent with normal respiratory phasicity. Hepatic Artery Velocity:  78 cm/sec Splenic Vein Velocity:  37 cm/sec Spleen: 11 cm x 4.3 cm x 11.2 cm with a total volume of 277 cm^3 (411 cm^3 is upper limit normal) Portal Vein Occlusion/Thrombus: No Splenic Vein Occlusion/Thrombus: No Ascites: None Varices: None Other: Small bilateral pleural effusions are noted incidentally. IMPRESSION: Normal liver Doppler ultrasound. Trace bilateral pleural effusions noted incidentally. Electronically Signed   By: Jacqulynn Cadet M.D.   On: 08/22/2019 10:55     Medications:   . sodium chloride 75 mL/hr at 08/23/19 0406   . aspirin EC  81 mg Oral Daily  . Chlorhexidine  Gluconate Cloth  6 each Topical Daily  . cholecalciferol  2,000 Units Oral Daily  . clopidogrel  75 mg Oral Daily  . ferrous [id number] C-folic acid  1 capsule Oral BID PC  . fluticasone  2 spray Each Nare Daily  . heparin  5,000 Units Subcutaneous Q8H  . insulin aspart  0-9 Units Subcutaneous TID WC  . insulin detemir  14 Units Subcutaneous QHS  . isosorbide mononitrate  30 mg Oral BID  . levothyroxine  125 mcg Oral Daily  . loratadine  10 mg Oral Daily  . pantoprazole  40 mg Oral Daily  . sodium chloride flush  3 mL Intravenous Q12H  . umeclidinium-vilanterol  1 puff Inhalation Daily   acetaminophen **OR** acetaminophen, albuterol, hydrALAZINE, nitroGLYCERIN, ondansetron **OR** ondansetron (ZOFRAN)  IV, oxyCODONE  Assessment/ Plan:  78 y.o. female with a PMHx of COPD, diabetes mellitus type 2, pulmonary hypertension, tricuspid regurgitation, congestive heart failure, recent left hip fracture, who was admitted to [facility] on 08/20/2019 for evaluation of altered mental status.   1.  Acute kidney injury/chronic kidney disease stage IIIa.  Baseline EGFR 45.    Renal parameters significantly improved.  BUN down to 50 with a creatinine of 1.4.  We will stop IV fluid hydration for now.  2.  Hyponatremia.  Serum sodium up to 137.  Stop IV fluids.  3.  Anemia of chronic kidney disease.  Hemoglobin down a bit to 8.4.  Likely dilutional.  May need to consider Epogen as an outpatient but hold off for now.   LOS: 3 Destiny Zuniga 11/5/202011:00 AM

## 2019-08-23 NOTE — Progress Notes (Signed)
PROGRESS NOTE    Destiny Zuniga  ZOX:[id number] DOB: Feb 24, 1941 DOA: 08/20/2019 PCP: Kirk Ruths, MD    Brief Narrative:   Destiny Zuniga is an 78 y.o. female with history significant for COPD, type II DM, pulmonary hypertension, tricuspid regurgitation, CHF, recent left hip fracture was brought to the hospital because of altered mental status.  She was found to have acute kidney injury with creatinine of 5.21.  Baseline creatinine is around 1.15.   Interim History: 1. Cre is improving 5.21 -->4.47-->2.34 -->1.4 today. US-renal has no hydronephrosis 2. US-liver: Normal liver Doppler ultrasound.  Liver function improvement 3. Has left hip pain which is likely from recent left hip surgery (ORIF) -CT head is negative for acute issues.    Assessment & Plan:   Principal Problem:   Acute renal failure superimposed on stage 3a chronic kidney disease (HCC) Active Problems:   Hip fracture (HCC)   COPD (chronic obstructive pulmonary disease) (HCC)   Type II diabetes mellitus with renal manifestations (HCC)   Hyperlipidemia   Hyperkalemia   Coronary artery disease   Chronic diastolic CHF (congestive heart failure) (HCC)   Hypothyroidism   Hypertension associated with diabetes (HCC)   Elevated liver enzymes   Altered mental status  Acute renal failure superimposed on stage 3a chronic kidney disease ([place]): improving. Cre is improving 5.21 -->4.47-->2.34  today. US-renal has no hydronephrosis. Consulted nephrologist to assist with management. Per Dr. Holley Raring, no urgent indication for dialysis at the moment. -will continue normal saline at 75 cc/h.  -hold lasix, cozarr -avoid NSAIDS. -chech CMP in AM which is also for abnormal LFT  Hyperkalemia: resovlve. K 4.0 today  Hyponatremia: resolved.  Sodium level is improving. Na 137.  Altered mental status/metabolic encephalopathy: improved, oriented x 3. CT-head negative. Ammonia level normal 18. -Frequent  neuro check  Leukocytosis: no fever. Improving. 12.1-->11.8 --.9.6. No clear evidence of infection at this time. This may be reactive. -f/u by CBC -Follow-up cultures -->so far negative bx and Ux  Chronic diastolic CHF: Lasix has been held.  2D echo on 10/05/2018 showed EF of 60 to 65%.  Patient has leg edema.  She has  mild shortness of breath, but dose not seem to have of acute CHFexacerbation. -Hold Lasix due to worsening renal function  CAD status post PCI: no chest pain -Continue aspirin, Plavix, Imdur   -Hold simvastatin because of elevated liver enzymes.   - Coreg is held because of sinus bradycardia  Elevated troponins: Trop 14, 110, 85. This is probably from demand ischemia/acute kidney injury.  No complaints of chest pain or shortness of breath.  Elevated liver enzymes: Etiology unclear. Liver US is negative. Hepatitis panel negative.  Improving. -check CMP.   Hepatic Function Latest Ref Rng & Units 08/23/2019 08/21/2019 08/21/2019  Total Protein 6.5 - 8.1 g/dL 5.0(L) - 5.2(L)  Albumin 3.5 - 5.0 g/dL 2.4(L) 2.5(L) 2.6(L)  AST 15 - 41 U/L 47(H) - 150(H)  ALT 0 - 44 U/L 174(H) - 323(H)  Alk Phosphatase 38 - 126 U/L 123 - 136(H)  Total Bilirubin 0.3 - 1.2 mg/dL 1.5(H) - 1.8(H)  Bilirubin, Direct 0.0 - 0.2 mg/dL - - 0.6(H)    Sinus bradycardia: Heart rate been stable between 50-60 beats per minutes. HR 101 -hold Coreg -->resumed coreg 11/5  Hypertension: Lasix and losartan have been held. Bp 130/70 -prn oral hydralazine orally   Type II diabetes mellitus with renal manifestations:  -Continue Levemir -SSI    COPD and chronic  hypoxemic respiratory failure: stable -Continue bronchodilators and oxygen via nasal cannula  Recent left hip fracture status post ORIF on 08/11/2019: -Prn oxycodone   DVT prophylaxis: Heparin Code Status: Full code Family Communication: daughter by phone Disposition Plan: Discharge to SNF in 3 to 4 days Barriers for discharge: abnormal  LFT, renal fx not back to baseline yet, but improving. Will check CMP in AM, if not worsening, will likely d/c tomorrow. Ordered PT/OT for their recommendations.   Consultants:   renal  Procedures:  US-renal  US-liver  Antimicrobials:    Subjective:   Has mild SOB, no CP, nausea, vomiting, abdominal pain or diarrhea.  No fever or chills.  Objective: Vitals:   08/22/19 1645 08/22/19 2039 08/23/19 0448 08/23/19 0740  BP: (!) 133/49 (!) 139/59 130/70 (!) 150/76  Pulse: 69 65 (!) 101 94  Resp: '19 20 20 18  ' Temp: 98 F (36.7 C) 98.2 F (36.8 C) 97.6 F (36.4 C) (!) 97.5 F (36.4 C)  TempSrc: Oral Oral Oral Oral  SpO2: 95% 98% 96% 97%  Weight:   103.3 kg   Height:        Intake/Output Summary (Last 24 hours) at 08/23/2019 1500 Last data filed at 08/23/2019 1120 Gross per 24 hour  Intake 1520.62 ml  Output 2450 ml  Net -929.38 ml   Filed Weights   08/20/19 1539 08/21/19 1606 08/23/19 0448  Weight: 90.7 kg 104.5 kg 103.3 kg    Examination: Physical Exam:  General: Not in acute distress HEENT: PERRL, EOMI, no scleral icterus, No JVD or bruit Cardiac: S1/S2, RRR, No murmurs, gallops or rubs Pulm: Clear to auscultation bilaterally. No rales, wheezing, rhonchi or rubs. Abd: Soft, nondistended, nontender, no rebound pain, no organomegaly, BS present Ext: 1+ leg edema bilaterally. 2+DP/PT pulse bilaterally Musculoskeletal: No joint deformities, erythema, or stiffness, ROM full Skin: No rashes.  Neuro: Intermittently confused, currently oriented X3, cranial nerves II-XII grossly intact, moves all extremities.  Psych: Patient is not psychotic, no suicidal or hemocidal ideation.    Data Reviewed: I have personally reviewed following labs and imaging studies  CBC: Recent Labs  Lab 08/20/19 1543 08/21/19 0513 08/22/19 1008 08/23/19 0432  WBC 16.3* 12.1* 11.8* 9.6  NEUTROABS 12.2*  --   --  7.0  HGB 9.6* 8.9* 9.2* 8.4*  HCT 31.2* 28.7* 30.2* 27.8*  MCV 82.5  81.1 83.7 84.0  PLT 265 223 248 053   Basic Metabolic Panel: Recent Labs  Lab 08/20/19 1543 08/20/19 2056 08/21/19 0513 08/22/19 1008 08/23/19 0432  NA 126* 128* 131* 139 137  K 7.0* 5.2* 4.9 4.3 4.0  CL 90* 91* 94* 104 105  CO2 '24 23 24 24 23  ' GLUCOSE 116* 170* 128* 230* 212*  BUN 97* 94* 100* 72* 50*  CREATININE 5.21* 4.56* 4.47* 2.34* 1.40*  CALCIUM 8.3* 8.4* 8.3* 8.1* 8.3*  MG 3.2*  --   --   --  2.1  PHOS  --  6.1* 6.5*  --   --    GFR: Estimated Creatinine Clearance: 36.7 mL/min (A) (by C-G formula based on SCr of 1.4 mg/dL (H)). Liver Function Tests: Recent Labs  Lab 08/20/19 1543 08/20/19 2056 08/21/19 0513 08/23/19 0432  AST 233*  --  150* 47*  ALT 424*  --  323* 174*  ALKPHOS 160*  --  136* 123  BILITOT 1.8*  --  1.8* 1.5*  PROT 5.7*  --  5.2* 5.0*  ALBUMIN 2.8* 2.6* 2.5*   2.6* 2.4*  No results for input(s): LIPASE, AMYLASE in the last 168 hours. Recent Labs  Lab 08/22/19 1829  AMMONIA 18   Coagulation Profile: No results for input(s): INR, PROTIME in the last 168 hours. Cardiac Enzymes: No results for input(s): CKTOTAL, CKMB, CKMBINDEX, TROPONINI in the last 168 hours. BNP (last 3 results) No results for input(s): PROBNP in the last 8760 hours. HbA1C: No results for input(s): HGBA1C in the last 72 hours. CBG: Recent Labs  Lab 08/22/19 1136 08/22/19 1647 08/22/19 2041 08/23/19 0741 08/23/19 1213  GLUCAP 227* 278* 255* 192* 156*   Lipid Profile: No results for input(s): CHOL, HDL, LDLCALC, TRIG, CHOLHDL, LDLDIRECT in the last 72 hours. Thyroid Function Tests: Recent Labs    08/20/19 1543  TSH 3.708   Anemia Panel: No results for input(s): VITAMINB12, FOLATE, FERRITIN, TIBC, IRON, RETICCTPCT in the last 72 hours. Sepsis Labs: Recent Labs  Lab 08/20/19 2035  LATICACIDVEN 1.3    Recent Results (from the past 240 hour(s))  Urine culture     Status: None   Collection Time: 08/20/19  5:19 PM   Specimen: In/Out Cath Urine  Result  Value Ref Range Status   Specimen Description   Final    IN/OUT CATH URINE Performed at [facility], [address], [place], [place] [address]    Special Requests   Final    NONE Performed at [facility], [address] [place], [place] [address]    Culture   Final    NO GROWTH Performed at [facility], [place] [address], [place], [place] [address]    Report Status 08/21/2019 FINAL  Final  Culture, blood (routine x 2)     Status: None (Preliminary result)   Collection Time: 08/20/19  5:21 PM   Specimen: BLOOD  Result Value Ref Range Status   Specimen Description BLOOD RIGHT ANTECUBITAL  Final   Special Requests   Final    BOTTLES DRAWN AEROBIC AND ANAEROBIC Blood Culture adequate volume   Culture   Final    NO GROWTH 3 DAYS Performed at [facility], [address] [place], [place] [address]    Report Status PENDING  Incomplete  SARS Coronavirus 2 by RT PCR (hospital order, performed in [place] hospital lab) Nasopharyngeal Nasopharyngeal Swab     Status: None   Collection Time: 08/20/19  5:47 PM   Specimen: Nasopharyngeal Swab  Result Value Ref Range Status   SARS Coronavirus 2 NEGATIVE NEGATIVE Final    Comment: (NOTE) If result is NEGATIVE SARS-CoV-2 target nucleic acids are NOT DETECTED. The SARS-CoV-2 RNA is generally detectable in upper and lower  respiratory specimens during the acute phase of infection. The lowest  concentration of SARS-CoV-2 viral copies this assay can detect is 250  copies / mL. A negative result does not preclude SARS-CoV-2 infection  and should not be used as the sole basis for treatment or other  patient management decisions.  A negative result may occur with  improper specimen collection / handling, submission of specimen other  than nasopharyngeal swab, presence of viral mutation(s) within the  areas targeted by this assay, and inadequate number of viral copies  (<250 copies / mL). A negative result  must be combined with clinical  observations, patient history, and epidemiological information. If result is POSITIVE SARS-CoV-2 target nucleic acids are DETECTED. The SARS-CoV-2 RNA is generally detectable in upper and lower  respiratory specimens dur ing the acute phase of infection.  Positive  results are  indicative of active infection with SARS-CoV-2.  Clinical  correlation with patient history and other diagnostic information is  necessary to determine patient infection status.  Positive results do  not rule out bacterial infection or co-infection with other viruses. If result is PRESUMPTIVE POSTIVE SARS-CoV-2 nucleic acids MAY BE PRESENT.   A presumptive positive result was obtained on the submitted specimen  and confirmed on repeat testing.  While 2019 novel coronavirus  (SARS-CoV-2) nucleic acids may be present in the submitted sample  additional confirmatory testing may be necessary for epidemiological  and / or clinical management purposes  to differentiate between  SARS-CoV-2 and other Sarbecovirus currently known to infect humans.  If clinically indicated additional testing with an alternate test  methodology [phone]) is advised. The SARS-CoV-2 RNA is generally  detectable in upper and lower respiratory sp ecimens during the acute  phase of infection. The expected result is Negative. Fact Sheet for Patients:  StrictlyIdeas.no Fact Sheet for Healthcare Providers: BankingDealers.co.za This test is not yet approved or cleared by the [place] FDA and has been authorized for detection and/or diagnosis of SARS-CoV-2 by FDA under an Emergency Use Authorization (EUA).  This EUA will remain in effect (meaning this test can be used) for the duration of the COVID-19 declaration under Section 564(b)(1) of the Act, 21 U.S.C. section 360bbb-3(b)(1), unless the authorization is terminated or revoked sooner. Performed at [facility], [address], [place], [place] [address]       Radiology Studies: Ct Head Wo Contrast  Result Date: 08/22/2019 CLINICAL DATA:  Encephalopathy. EXAM: CT HEAD WITHOUT CONTRAST TECHNIQUE: Contiguous axial images were obtained from the base of the skull through the vertex without intravenous contrast. COMPARISON:  08/10/2019 FINDINGS: Brain: There is no evidence of acute infarct, intracranial hemorrhage, mass, midline shift, or extra-axial fluid collection. The ventricles and sulci are within normal limits for age. Hypodensities in the cerebral white matter bilaterally are unchanged and nonspecific but compatible with mild chronic small vessel ischemic disease. 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 Vascular: Calcified atherosclerosis at the skull base. No hyperdense vessel. Skull: No fracture or focal osseous lesion. Sinuses/Orbits: Visualized paranasal sinuses and mastoid air cells are clear. Bilateral cataract extraction is noted. Other: None. IMPRESSION: 1. No evidence of acute intracranial abnormality. 2. Mild chronic small vessel ischemic disease. Electronically Signed   By: Logan Bores M.D.   On: 08/22/2019 19:05   US Liver Doppler  Result Date: 08/22/2019 CLINICAL DATA:  78 year old female with elevated liver enzymes EXAM: DUPLEX ULTRASOUND OF LIVER TECHNIQUE: Color and duplex Doppler ultrasound was performed to evaluate the hepatic in-flow and out-flow vessels. COMPARISON:  None. FINDINGS: Liver: Normal parenchymal echogenicity. Normal hepatic contour without nodularity. No focal lesion, mass or intrahepatic biliary ductal dilatation. Main Portal Vein size: 0.9 cm Portal Vein Velocities Main Prox:  62 cm/sec with normal hepatopetal directional flow. Main Mid: 42 cm/sec with normal hepatopetal directional flow. Main Dist:  38 cm/sec with normal hepatopetal directional flow. Right: 48 cm/sec with normal hepatopetal directional flow. Left: 21 cm/sec  with normal hepatopetal directional flow. Hepatic Vein Velocities Right:  87 cm/sec Middle:  41 cm/sec Left:  75 cm/sec IVC: Present and patent with normal respiratory phasicity. Hepatic Artery Velocity:  78 cm/sec Splenic Vein Velocity:  37 cm/sec Spleen: 11 cm x 4.3 cm x 11.2 cm with a total volume of 277 cm^3 (411 cm^3 is upper limit normal) Portal Vein Occlusion/Thrombus: No Splenic Vein Occlusion/Thrombus: No  Ascites: None Varices: None Other: Small bilateral pleural effusions are noted incidentally. IMPRESSION: Normal liver Doppler ultrasound. Trace bilateral pleural effusions noted incidentally. Electronically Signed   By: Jacqulynn Cadet M.D.   On: 08/22/2019 10:55        Scheduled Meds:  aspirin EC  81 mg Oral Daily   carvedilol  3.125 mg Oral BID WC   Chlorhexidine Gluconate Cloth  6 each Topical Daily   cholecalciferol  2,000 Units Oral Daily   clopidogrel  75 mg Oral Daily   ferrous [id number] C-folic acid  1 capsule Oral BID PC   fluticasone  2 spray Each Nare Daily   heparin  5,000 Units Subcutaneous Q8H   insulin aspart  0-9 Units Subcutaneous TID WC   insulin detemir  14 Units Subcutaneous QHS   isosorbide mononitrate  30 mg Oral BID   levothyroxine  125 mcg Oral Daily   loratadine  10 mg Oral Daily   pantoprazole  40 mg Oral Daily   sodium chloride flush  3 mL Intravenous Q12H   umeclidinium-vilanterol  1 puff Inhalation Daily   Continuous Infusions:    LOS: 3 days    Time spent: 30 min    Ivor Costa, DO [facility] PAGER is on AMION  If 7PM-7AM, please contact night-coverage [email or web address] Password [email or web address] 08/23/2019, 3:00 PM

## 2019-08-23 NOTE — Evaluation (Signed)
Physical Therapy Evaluation Patient Details Name: Destiny Zuniga MRN: [id number] DOB: 1941-01-21 Today's Date: 08/23/2019   History of Present Illness  Patient is a 78 year old female admitted from SNF with AMS, acute renal failure. S/P hip fx and ORIF 08/11/19. PMH to include: COPD, Home O2, DM, HLD, CAD, CHF, HTN.  Clinical Impression  Patient received in bed, agrees to PT assessment. Patient reports significant pain in left hip. Requires mod assist to go from supine to sit. Able to sit edge of bed x 10 min with feet and 1 UE support. Required +2 mod assist to stand with RW. Able to take 2 steps along side up bed before requesting to sit back down. Patient required max +2 assist to return to supine and scoot up in the bed. Patient will continue to benefit from skilled PT to improve overall mobility and strength.      Follow Up Recommendations SNF    Equipment Recommendations  None recommended by PT    Recommendations for Other Services       Precautions / Restrictions Precautions Precautions: Fall Restrictions Weight Bearing Restrictions: No RLE Weight Bearing: Weight bearing as tolerated LLE Weight Bearing: Weight bearing as tolerated      Mobility  Bed Mobility Overal bed mobility: Needs Assistance Bed Mobility: Supine to Sit;Sit to Supine     Supine to sit: Mod assist Sit to supine: Max assist;+2 for physical assistance      Transfers Overall transfer level: Needs assistance Equipment used: Rolling walker (2 wheeled) Transfers: Sit to/from Stand Sit to Stand: Mod assist;+2 physical assistance;From elevated surface            Ambulation/Gait Ambulation/Gait assistance: Min assist;+2 physical assistance Gait Distance (Feet): 1 Feet Assistive device: Rolling walker (2 wheeled) Gait Pattern/deviations: Step-to pattern Gait velocity: decreased   General Gait Details: patient able to take 2 side steps along bed toward head of bed then needed to sit back  down.  Stairs            Wheelchair Mobility    Modified Rankin (Stroke Patients Only)       Balance Overall balance assessment: Needs assistance Sitting-balance support: Feet supported;Single extremity supported Sitting balance-Leahy Scale: Fair     Standing balance support: Bilateral upper extremity supported Standing balance-Leahy Scale: Poor                               Pertinent Vitals/Pain Pain Assessment: 0-10 Pain Score: 8  Pain Location: L hip/LE Pain Descriptors / Indicators: Sore;Discomfort;Grimacing;Guarding Pain Intervention(s): Limited activity within patient's tolerance;Repositioned;Monitored during session    Home Living Family/patient expects to be discharged to:: Skilled nursing facility Living Arrangements: Spouse/significant other Available Help at Discharge: Family;Available PRN/intermittently Type of Home: House Home Access: Stairs to enter   [employer] of Steps: 4 Home Layout: One level        Prior Function Level of Independence: Independent         Comments: Occasional use of a SPC. Since hip fracture she has not been very mobile.     Hand Dominance        Extremity/Trunk Assessment   Upper Extremity Assessment Upper Extremity Assessment: Overall WFL for tasks assessed    Lower Extremity Assessment Lower Extremity Assessment: LLE deficits/detail LLE: Unable to fully assess due to pain LLE Sensation: WNL LLE Coordination: decreased gross motor    Cervical / Trunk Assessment Cervical / Trunk Assessment: Kyphotic  Communication   Communication: No difficulties  Cognition Arousal/Alertness: Awake/alert Behavior During Therapy: WFL for tasks assessed/performed;Anxious Overall Cognitive Status: Within Functional Limits for tasks assessed                                 General Comments: patient motivated, good sense of humor.      General Comments      Exercises Total  Joint Exercises Ankle Circles/Pumps: AROM;Both;5 reps Heel Slides: AROM;5 reps;Left Hip ABduction/ADduction: AAROM;Left;5 reps   Assessment/Plan    PT Assessment Patient needs continued PT services  PT Problem List Decreased strength;Decreased mobility;Decreased activity tolerance;Decreased balance;Pain;Decreased range of motion;Obesity       PT Treatment Interventions DME instruction;Therapeutic activities;Gait training;Therapeutic exercise;Balance training;Functional mobility training;Patient/family education    PT Goals (Current goals can be found in the Care Plan section)  Acute Rehab PT Goals Patient Stated Goal: to go back home after rehab PT Goal Formulation: With patient Time For Goal Achievement: 09/02/19 Potential to Achieve Goals: Good    Frequency 7X/week   Barriers to discharge Decreased caregiver support;Inaccessible home environment      Co-evaluation               AM-PAC PT "6 Clicks" Mobility  Outcome Measure Help needed turning from your back to your side while in a flat bed without using bedrails?: A Lot Help needed moving from lying on your back to sitting on the side of a flat bed without using bedrails?: A Lot Help needed moving to and from a bed to a chair (including a wheelchair)?: A Lot Help needed standing up from a chair using your arms (e.g., wheelchair or bedside chair)?: A Lot Help needed to walk in hospital room?: Total Help needed climbing 3-5 steps with a railing? : Total 6 Click Score: 10    End of Session Equipment Utilized During Treatment: Gait belt;Oxygen Activity Tolerance: Patient limited by pain;Patient limited by fatigue Patient left: in bed;with bed alarm set;with call bell/phone within reach Nurse Communication: Mobility status PT Visit Diagnosis: Unsteadiness on feet (R26.81);Muscle weakness (generalized) (M62.81);Difficulty in walking, not elsewhere classified (R26.2);History of falling (Z91.81);Pain;Other abnormalities  of gait and mobility (R26.89) Pain - Right/Left: Left Pain - part of body: Hip    Time: 1130-1206 PT Time Calculation (min) (ACUTE ONLY): 36 min   Charges:   PT Evaluation $PT Eval Moderate Complexity: 1 Mod PT Treatments $Therapeutic Activity: 8-22 mins        [employer], PT, GCS 08/23/19,12:22 PM

## 2019-08-24 ENCOUNTER — Inpatient Hospital Stay: Admit: 2019-08-24 | Payer: Medicare Other

## 2019-08-24 DIAGNOSIS — R4182 Altered mental status, unspecified: Secondary | ICD-10-CM

## 2019-08-24 DIAGNOSIS — I4891 Unspecified atrial fibrillation: Secondary | ICD-10-CM

## 2019-08-24 DIAGNOSIS — J449 Chronic obstructive pulmonary disease, unspecified: Secondary | ICD-10-CM

## 2019-08-24 DIAGNOSIS — I5032 Chronic diastolic (congestive) heart failure: Secondary | ICD-10-CM

## 2019-08-24 DIAGNOSIS — N1831 Chronic kidney disease, stage 3a: Secondary | ICD-10-CM

## 2019-08-24 DIAGNOSIS — E785 Hyperlipidemia, unspecified: Secondary | ICD-10-CM

## 2019-08-24 DIAGNOSIS — N179 Acute kidney failure, unspecified: Principal | ICD-10-CM

## 2019-08-24 LAB — COMPREHENSIVE METABOLIC PANEL
ALT: 139 U/L — ABNORMAL HIGH (ref 0–44)
AST: 45 U/L — ABNORMAL HIGH (ref 15–41)
Albumin: 2.7 g/dL — ABNORMAL LOW (ref 3.5–5.0)
Alkaline Phosphatase: 135 U/L — ABNORMAL HIGH (ref 38–126)
Anion gap: 8 (ref 5–15)
BUN: 28 mg/dL — ABNORMAL HIGH (ref 8–23)
CO2: 25 mmol/L (ref 22–32)
Calcium: 8.5 mg/dL — ABNORMAL LOW (ref 8.9–10.3)
Chloride: 105 mmol/L (ref 98–111)
Creatinine, Ser: 0.99 mg/dL (ref 0.44–1.00)
GFR calc Af Amer: >60 mL/min (ref >60)
GFR calc non Af Amer: 54 mL/min — ABNORMAL LOW (ref >60)
Glucose, Bld: 105 mg/dL — ABNORMAL HIGH (ref 70–99)
Potassium: 4 mmol/L (ref 3.5–5.1)
Sodium: 138 mmol/L (ref 135–145)
Total Bilirubin: 1.3 mg/dL — ABNORMAL HIGH (ref 0.3–1.2)
Total Protein: 5.4 g/dL — ABNORMAL LOW (ref 6.5–8.1)

## 2019-08-24 LAB — GLUCOSE, CAPILLARY
Glucose-Capillary: 97 mg/dL (ref 70–99)
Glucose-Capillary: 122 mg/dL — ABNORMAL HIGH (ref 70–99)
Glucose-Capillary: 133 mg/dL — ABNORMAL HIGH (ref 70–99)
Glucose-Capillary: 144 mg/dL — ABNORMAL HIGH (ref 70–99)

## 2019-08-24 LAB — CBC
HCT: 31.9 % — ABNORMAL LOW (ref 36.0–46.0)
Hemoglobin: 9.3 g/dL — ABNORMAL LOW (ref 12.0–15.0)
MCH: 25.4 pg — ABNORMAL LOW (ref 26.0–34.0)
MCHC: 29.2 g/dL — ABNORMAL LOW (ref 30.0–36.0)
MCV: 87.2 fL (ref 80.0–100.0)
Platelets: 258 10*3/uL (ref 150–400)
RBC: 3.66 MIL/uL — ABNORMAL LOW (ref 3.87–5.11)
RDW: 22.4 % — ABNORMAL HIGH (ref 11.5–15.5)
WBC: 10.9 10*3/uL — ABNORMAL HIGH (ref 4.0–10.5)
nRBC: 0 % (ref 0.0–0.2)

## 2019-08-24 MED ORDER — FUROSEMIDE 20 MG PO TABS: 20.0000 mg | ORAL_TABLET | Freq: Two times a day (BID) | ORAL | 1 refills | Status: AC

## 2019-08-24 MED ORDER — INSULIN DETEMIR 100 UNIT/ML FLEXPEN: 14.0000 [IU] | PEN_INJECTOR | Freq: Every day | SUBCUTANEOUS | 1 refills | Status: AC

## 2019-08-24 MED ORDER — HYDROXYZINE HCL 10 MG PO TABS
5.0000 mg | ORAL_TABLET | Freq: Three times a day (TID) | ORAL | Status: DC | PRN
  Administered 2019-08-24 – 2019-08-27 (×5): 5 mg via ORAL
  Filled 2019-08-24 (×8): qty 1

## 2019-08-24 MED ORDER — DILTIAZEM HCL 100 MG IV SOLR
5.0000 mg/h | INTRAVENOUS | Status: DC
  Administered 2019-08-24: 5 mg/h via INTRAVENOUS
  Administered 2019-08-24: 10 mg/h via INTRAVENOUS
  Administered 2019-08-24: 5 mg/h via INTRAVENOUS
  Filled 2019-08-24 (×3): qty 100

## 2019-08-24 MED ORDER — APIXABAN 5 MG PO TABS: 5.0000 mg | ORAL_TABLET | Freq: Two times a day (BID) | ORAL | Status: DC

## 2019-08-24 MED ORDER — TRAMADOL HCL 50 MG PO TABS: 50.0000 mg | ORAL_TABLET | Freq: Four times a day (QID) | ORAL | 0 refills | Status: DC | PRN

## 2019-08-24 MED ORDER — APIXABAN 5 MG PO TABS
5.0000 mg | ORAL_TABLET | Freq: Two times a day (BID) | ORAL | Status: DC
  Administered 2019-08-24 – 2019-08-29 (×10): 5 mg via ORAL
  Filled 2019-08-24 (×10): qty 1

## 2019-08-24 MED ORDER — ALPRAZOLAM 0.25 MG PO TABS
0.2500 mg | ORAL_TABLET | Freq: Once | ORAL | Status: AC
  Administered 2019-08-24: 0.25 mg via ORAL
  Filled 2019-08-24: qty 1

## 2019-08-24 NOTE — Plan of Care (Signed)
°  Problem: Education: °Goal: Knowledge of General Education information will improve °Description: Including pain rating scale, medication(s)/side effects and non-pharmacologic comfort measures °Outcome: Progressing °  °Problem: Clinical Measurements: °Goal: Diagnostic test results will improve °Outcome: Progressing °  °Problem: Pain Managment: °Goal: General experience of comfort will improve °Outcome: Progressing °  °Problem: Safety: °Goal: Ability to remain free from injury will improve °Outcome: Progressing °  °

## 2019-08-24 NOTE — Progress Notes (Signed)
Patient HR sustaining 110-120s. NP Ouma notified. MD to place orders for metoprolol 2.5 mg IV push. Will administer as ordered.   Update: Per pt and daughter, she is unable to take metoprolol for undisclosed reason. NP Ouma made aware. Orders placed for labetalol 5 mg IV push once. Will continue to monitor.   Destiny Zuniga

## 2019-08-24 NOTE — [place] FL2 (Signed)
MEDICAID FL2 LEVEL OF CARE SCREENING TOOL     IDENTIFICATION  Patient Name: Destiny Zuniga Birthdate: 1941-06-26 Sex: female Admission Date (Current Location): 08/20/2019  [place] and [place] Number:  Chiropodist and Address:  [facility], [address], [place], [place] [address]      Provider Number: [id number]  Attending Physician Name and Address:  Lorretta Harp, MD  Relative Name and Phone Number:  Vickki Hearing Daughter   [phone]    Current Level of Care: Hospital Recommended Level of Care: Skilled Nursing Facility Prior Approval Number:    Date Approved/Denied:   PASRR Number: [id number]  Discharge Plan: SNF    Current Diagnoses: Patient Active Problem List   Diagnosis Date Noted  . New onset atrial fibrillation with RVR 08/24/2019  . Acute renal failure superimposed on stage 3a chronic kidney disease (HCC) 08/22/2019  . Altered mental status   . Elevated liver enzymes   . Acute renal failure (ARF) (HCC) 08/20/2019  . COPD (chronic obstructive pulmonary disease) (HCC)   . Type II diabetes mellitus with renal manifestations (HCC)   . Hyperlipidemia   . Hyperkalemia   . Hyponatremia   . Bradycardia   . Coronary artery disease   . Chronic diastolic CHF (congestive heart failure) (HCC)   . Hypothyroidism   . Hypertension associated with diabetes (HCC)   . Hip fracture (HCC) 08/10/2019    Orientation RESPIRATION BLADDER Height & Weight     Self, Time, Place  O2(2L) Continent Weight: 228 lb 6.3 oz (103.6 kg) Height:  5\' 2"  (157.5 cm)  BEHAVIORAL SYMPTOMS/MOOD NEUROLOGICAL BOWEL NUTRITION STATUS      Incontinent Diet(Heart Healthy Carb Modified)  AMBULATORY STATUS COMMUNICATION OF NEEDS Skin   Limited Assist Verbally Surgical wounds                       Personal Care Assistance Level of Assistance  Bathing, Feeding, Dressing Bathing Assistance: Limited assistance Feeding assistance: Limited  assistance Dressing Assistance: Limited assistance     Functional Limitations Info  Sight, Hearing, Speech Sight Info: Adequate Hearing Info: Adequate Speech Info: Adequate    SPECIAL CARE FACTORS FREQUENCY  PT (By licensed PT), OT (By licensed OT)     PT Frequency: Minimum 5x a week OT Frequency: Minimum 5x a week            Contractures Contractures Info: Not present    Additional Factors Info  Code Status, Allergies, Insulin Sliding Scale Code Status Info: Full Code Allergies Info: Codeine Midazolam Celecoxib Lisinopril   Insulin Sliding Scale Info: insulin aspart (novoLOG) injection 0-9 Units 3x a day with meals       Current Medications (08/24/2019):  This is the current hospital active medication list Current Facility-Administered Medications  Medication Dose Route Frequency Provider Last Rate Last Dose  . acetaminophen (TYLENOL) tablet 650 mg  650 mg Oral Q6H PRN 13/03/2019, MD   650 mg at 08/23/19 2127   Or  . acetaminophen (TYLENOL) suppository 650 mg  650 mg Rectal Q6H PRN 2128, MD      . albuterol (PROVENTIL) (2.5 MG/3ML) 0.083% nebulizer solution 2.5 mg  2.5 mg Inhalation Q6H PRN Lurene Shadow, MD      . apixaban Lurene Shadow) tablet 5 mg  5 mg Oral BID Dunn, Ryan M, PA-C      . carvedilol (COREG) tablet 3.125 mg  3.125 mg Oral BID WC M, MD   3.125  mg at 08/24/19 1123  . Chlorhexidine Gluconate Cloth 2 % PADS 6 each  6 each Topical Daily Lenore Cordia, MD   6 each at 08/23/19 [phone]  . cholecalciferol (VITAMIN D3) tablet 2,000 Units  2,000 Units Oral Daily Ivor Costa, MD   2,000 Units at 08/24/19 1112  . diltiazem (CARDIZEM) 100 mg in dextrose 5 % 100 mL (1 mg/mL) infusion  5-15 mg/hr Intravenous Titrated Rise Mu, PA-C 10 mL/hr at 08/24/19 1505 10 mg/hr at 08/24/19 1505  . ferrous [id number] C-folic acid (TRINSICON / FOLTRIN) capsule 1 capsule  1 capsule Oral BID PC Ivor Costa, MD   1 capsule at 08/24/19 1113  . fluticasone  (FLONASE) 50 MCG/ACT nasal spray 2 spray  2 spray Each Nare Daily Ivor Costa, MD   2 spray at 08/24/19 1121  . hydrALAZINE (APRESOLINE) tablet 25 mg  25 mg Oral TID PRN Ivor Costa, MD      . hydrOXYzine (ATARAX/VISTARIL) tablet 5 mg  5 mg Oral TID PRN Ivor Costa, MD      . insulin aspart (novoLOG) injection 0-9 Units  0-9 Units Subcutaneous TID WC Jennye Boroughs, MD   1 Units at 08/24/19 1440  . insulin detemir (LEVEMIR) injection 14 Units  14 Units Subcutaneous QHS Ivor Costa, MD   14 Units at 08/23/19 2128  . isosorbide mononitrate (IMDUR) 24 hr tablet 30 mg  30 mg Oral BID Jennye Boroughs, MD   30 mg at 08/24/19 1111  . levothyroxine (SYNTHROID) tablet 125 mcg  125 mcg Oral Daily Jennye Boroughs, MD   125 mcg at 08/24/19 0531  . loratadine (CLARITIN) tablet 10 mg  10 mg Oral Daily Ivor Costa, MD   10 mg at 08/24/19 1112  . nitroGLYCERIN (NITROSTAT) SL tablet 0.4 mg  0.4 mg Sublingual Q5 min PRN Ivor Costa, MD      . ondansetron [facility]) tablet 4 mg  4 mg Oral Q6H PRN Jennye Boroughs, MD       Or  . ondansetron (ZOFRAN) injection 4 mg  4 mg Intravenous Q6H PRN Jennye Boroughs, MD      . oxyCODONE (Oxy IR/ROXICODONE) immediate release tablet 5 mg  5 mg Oral Q6H PRN Ivor Costa, MD   5 mg at 08/24/19 1331  . pantoprazole (PROTONIX) EC tablet 40 mg  40 mg Oral Daily Ivor Costa, MD   40 mg at 08/24/19 1113  . sodium chloride flush (NS) 0.9 % injection 3 mL  3 mL Intravenous Q12H Jennye Boroughs, MD   3 mL at 08/24/19 1125  . umeclidinium-vilanterol (ANORO ELLIPTA) 62.5-25 MCG/INH 1 puff  1 puff Inhalation Daily Jennye Boroughs, MD   1 puff at 08/24/19 1124     Discharge Medications: Please see discharge summary for a list of discharge medications.  Relevant Imaging Results:  Relevant Lab Results:   Additional Information SSN [id number]  Ross Ludwig, LCSW

## 2019-08-24 NOTE — Progress Notes (Signed)
PROGRESS NOTE    Destiny Zuniga  IYM:[id number] DOB: [birth year] DOA: 08/20/2019 PCP: Kirk Ruths, MD    Brief Narrative:   Destiny Zuniga is an 78 y.o. female with history significant for COPD, type II DM, pulmonary hypertension, tricuspid regurgitation, CHF, recent left hip fracture was brought to the hospital because of altered mental status.  She was found to have acute kidney injury with creatinine of 5.21.  Baseline creatinine is around 1.15.  Interim History: 1. Cre is improving 5.21 -->4.47-->2.34 -->1.4-->0.99 today. US-renal has no hydronephrosis 2. US-liver: Normal liver Doppler ultrasound.  Liver function improvement 3. Has left hip pain which is likely from recent left hip surgery (ORIF) 4. CT head is negative for acute issues 10/5. 5. Pt developed new onset A fib with RVR, card was consulted, started Eliquis, stopped ASA and plavix, will get 2d echo. On coreg and started Cardizem gtt   Assessment & Plan:   Principal Problem:   New onset atrial fibrillation with RVR Active Problems:   Hip fracture (HCC)   COPD (chronic obstructive pulmonary disease) (HCC)   Type II diabetes mellitus with renal manifestations (HCC)   Hyperlipidemia   Hyperkalemia   Coronary artery disease   Chronic diastolic CHF (congestive heart failure) (HCC)   Hypothyroidism   Hypertension associated with diabetes (HCC)   Elevated liver enzymes   Acute renal failure superimposed on stage 3a chronic kidney disease (HCC)   Altered mental status   New onset Afib with RVR: HR up to 130s. Patient developed Afib with RVR today. She had bradycardia in this admission. 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 CHADS2VASc is 7, will need anticoagulants. TSH normal on 08/20/2019. Cardiology was consulted, Dr. Idolina Primer evaluated pt.   -Highly appreciate Dr. Trish Fountain consultation, will follow up recommendations. - Started diltiazem gtt for rate control  -She was documented to be sinus  bradycardia upon admission leading her Coreg to be held at that time - may need to see EP for tachy-brady syndrome  -Started Eliquis 5 mg bid given her  -No recent stenting, therefore we will stopped ASA and Plavix to minimize her bleeding risk, especially with her underlying anemia - Check echo  Acute renal failure superimposed on stage 3a chronic kidney disease ([place]): improving. Cre is improving 5.21 -->4.47-->2.34 -->0.99  today. US-renal has no hydronephrosis. Consulted nephrologist to assist with management. Per Dr. Holley Raring, no urgent indication for dialysis at the moment. - continue hold lasix, cozarr - d/c IVF -avoid NSAIDS. -chech CMP in AM which is also for abnormal LFT  Hyperkalemia: resovlve. K 4.0 today  Hyponatremia: resolved.  Sodium level is improving. Na 138.  Altered mental status/metabolic encephalopathy: improved, oriented x 3. CT-head negative. Ammonia level normal 18. -Frequent neuro check  Leukocytosis: no fever. Improving. 12.1-->11.8 -->9.6 -->10.6. No clear evidence of infection at this time. This may be reactive. -f/u by CBC -Follow-up cultures -->so far negative bx and Ux  Chronic diastolic CHF: Lasix has been held.  2D echo on 10/05/2018 showed EF of 60 to 65%.  Patient has leg edema.  She has  mild shortness of breath, but dose not seem to have of acute CHFexacerbation. -Hold Lasix due to worsening renal function  CAD status post PCI: no chest pain -Continue  Imdur  - d/c'ed ASA and plavis due to newly started Eliquis -Hold simvastatin because of elevated liver enzymes.   - Coreg is restarted for A fib with RVR  Elevated troponins: Trop 14, 110, 85. This  is probably from demand ischemia/acute kidney injury.  No complaints of chest pain or shortness of breath.  Elevated liver enzymes: Etiology unclear. Liver US is negative. Hepatitis panel negative.  Improving. -check CMP.   Hepatic Function Latest Ref Rng & Units 08/24/2019 08/23/2019 08/21/2019   Total Protein 6.5 - 8.1 g/dL 5.4(L) 5.0(L) -  Albumin 3.5 - 5.0 g/dL 2.7(L) 2.4(L) 2.5(L)  AST 15 - 41 U/L 45(H) 47(H) -  ALT 0 - 44 U/L 139(H) 174(H) -  Alk Phosphatase 38 - 126 U/L 135(H) 123 -  Total Bilirubin 0.3 - 1.2 mg/dL 1.3(H) 1.5(H) -  Bilirubin, Direct 0.0 - 0.2 mg/dL - - -   Sinus bradycardia: Heart rate been stable between 50-60 beats per minutes. HR 101 -hold Coreg -->resumed coreg 11/5  Hypertension: Lasix and losartan have been held. Bp 130/70 -prn oral hydralazine orally   Type II diabetes mellitus with renal manifestations:  -Continue Levemir -SSI    COPD and chronic hypoxemic respiratory failure: stable -Continue bronchodilators and oxygen via nasal cannula  Recent left hip fracture status post ORIF on 08/11/2019: -Prn oxycodone   DVT prophylaxis: Heparin Code Status: Full code Family Communication: daughter by phone Disposition Plan: Discharge to SNF in 3 to 4 days Barriers for discharge: abnormal LFT, renal fx not back to baseline yet, but improving. Will check CMP in AM, if not worsening, will likely d/c tomorrow. Ordered PT/OT for their recommendations.   Consultants:   renal  Procedures:  US-renal  US-liver  Antimicrobials:    Subjective:   Has mild SOB, no CP, nausea, vomiting, abdominal pain or diarrhea.  No fever or chills.  Objective: Vitals:   08/24/19 1138 08/24/19 1415 08/24/19 1432 08/24/19 1504  BP:  139/83 140/77 (!) 152/91  Pulse: 78 (!) 130 (!) 128 (!) 127  Resp:  18 18   Temp:      TempSrc:      SpO2:  99% 95%   Weight:      Height:        Intake/Output Summary (Last 24 hours) at 08/24/2019 1525 Last data filed at 08/24/2019 1027 Gross per 24 hour  Intake --  Output 1650 ml  Net -1650 ml   Filed Weights   08/21/19 1606 08/23/19 0448 08/24/19 0500  Weight: 104.5 kg 103.3 kg 103.6 kg    Examination: Physical Exam:  General: Not in acute distress HEENT: PERRL, EOMI, no scleral icterus, No JVD or  bruit Cardiac: S1/S2, RRR, No murmurs, gallops or rubs Pulm: Clear to auscultation bilaterally. No rales, wheezing, rhonchi or rubs. Abd: Soft, nondistended, nontender, no rebound pain, no organomegaly, BS present Ext: 1+ leg edema bilaterally. 2+DP/PT pulse bilaterally Musculoskeletal: No joint deformities, erythema, or stiffness, ROM full Skin: No rashes.  Neuro: Intermittently confused, currently oriented X3, cranial nerves II-XII grossly intact, moves all extremities.  Psych: Patient is not psychotic, no suicidal or hemocidal ideation.    Data Reviewed: I have personally reviewed following labs and imaging studies  CBC: Recent Labs  Lab 08/20/19 1543 08/21/19 0513 08/22/19 1008 08/23/19 0432 08/24/19 0559  WBC 16.3* 12.1* 11.8* 9.6 10.9*  NEUTROABS 12.2*  --   --  7.0  --   HGB 9.6* 8.9* 9.2* 8.4* 9.3*  HCT 31.2* 28.7* 30.2* 27.8* 31.9*  MCV 82.5 81.1 83.7 84.0 87.2  PLT 265 223 248 225 253   Basic Metabolic Panel: Recent Labs  Lab 08/20/19 1543 08/20/19 2056 08/21/19 0513 08/22/19 1008 08/23/19 0432 08/24/19 0559  NA 126* 128*  131* 139 137 138  K 7.0* 5.2* 4.9 4.3 4.0 4.0  CL 90* 91* 94* 104 105 105  CO2 _0 GLUCOSE 116* 170* 128* 230* 212* 105*  BUN 97* 94* 100* 72* 50* 28*  CREATININE 5.21* 4.56* 4.47* 2.34* 1.40* 0.99  CALCIUM 8.3* 8.4* 8.3* 8.1* 8.3* 8.5*  MG 3.2*  --   --   --  2.1  --   PHOS  --  6.1* 6.5*  --   --   --    GFR: Estimated Creatinine Clearance: 52 mL/min (by C-G formula based on SCr of 0.99 mg/dL). Liver Function Tests: Recent Labs  Lab 08/20/19 1543 08/20/19 2056 08/21/19 0513 08/23/19 0432 08/24/19 0559  AST 233*  --  150* 47* 45*  ALT 424*  --  323* 174* 139*  ALKPHOS 160*  --  136* 123 135*  BILITOT 1.8*  --  1.8* 1.5* 1.3*  PROT 5.7*  --  5.2* 5.0* 5.4*  ALBUMIN 2.8* 2.6* 2.5*   2.6* 2.4* 2.7*   No results for input(s): LIPASE, AMYLASE in the last 168 hours. Recent Labs  Lab 08/22/19 1829  AMMONIA 18    Coagulation Profile: No results for input(s): INR, PROTIME in the last 168 hours. Cardiac Enzymes: No results for input(s): CKTOTAL, CKMB, CKMBINDEX, TROPONINI in the last 168 hours. BNP (last 3 results) No results for input(s): PROBNP in the last 8760 hours. HbA1C: No results for input(s): HGBA1C in the last 72 hours. CBG: Recent Labs  Lab 08/23/19 1213 08/23/19 1640 08/23/19 2109 08/24/19 0756 08/24/19 1220  GLUCAP 156* 196* 152* 97 122*   Lipid Profile: No results for input(s): CHOL, HDL, LDLCALC, TRIG, CHOLHDL, LDLDIRECT in the last 72 hours. Thyroid Function Tests: No results for input(s): TSH, T4TOTAL, FREET4, T3FREE, THYROIDAB in the last 72 hours. Anemia Panel: No results for input(s): VITAMINB12, FOLATE, FERRITIN, TIBC, IRON, RETICCTPCT in the last 72 hours. Sepsis Labs: Recent Labs  Lab 08/20/19 2035  LATICACIDVEN 1.3    Recent Results (from the past 240 hour(s))  Urine culture     Status: None   Collection Time: 08/20/19  5:19 PM   Specimen: In/Out Cath Urine  Result Value Ref Range Status   Specimen Description   Final    IN/OUT CATH URINE Performed at [facility], [address] [place], [place] [address]    Special Requests   Final    NONE Performed at [facility], [address], [place], [place] [address]    Culture   Final    NO GROWTH Performed at [facility], [place] [address], [place], [place] [address]    Report Status 08/21/2019 FINAL  Final  Culture, blood (routine x 2)     Status: None (Preliminary result)   Collection Time: 08/20/19  5:21 PM   Specimen: BLOOD  Result Value Ref Range Status   Specimen Description BLOOD RIGHT ANTECUBITAL  Final   Special Requests   Final    BOTTLES DRAWN AEROBIC AND ANAEROBIC Blood Culture adequate volume   Culture   Final    NO GROWTH 3 DAYS Performed at [facility], [address] [place], [place] [address]    Report Status PENDING  Incomplete  SARS  Coronavirus 2 by RT PCR (hospital order, performed in [place] hospital lab) Nasopharyngeal Nasopharyngeal Swab     Status: None   Collection Time: 08/20/19  5:47 PM   Specimen: Nasopharyngeal Swab  Result  Value Ref Range Status   SARS Coronavirus 2 NEGATIVE NEGATIVE Final    Comment: (NOTE) If result is NEGATIVE SARS-CoV-2 target nucleic acids are NOT DETECTED. The SARS-CoV-2 RNA is generally detectable in upper and lower  respiratory specimens during the acute phase of infection. The lowest  concentration of SARS-CoV-2 viral copies this assay can detect is 250  copies / mL. A negative result does not preclude SARS-CoV-2 infection  and should not be used as the sole basis for treatment or other  patient management decisions.  A negative result may occur with  improper specimen collection / handling, submission of specimen other  than nasopharyngeal swab, presence of viral mutation(s) within the  areas targeted by this assay, and inadequate number of viral copies  (<250 copies / mL). A negative result must be combined with clinical  observations, patient history, and epidemiological information. If result is POSITIVE SARS-CoV-2 target nucleic acids are DETECTED. The SARS-CoV-2 RNA is generally detectable in upper and lower  respiratory specimens dur ing the acute phase of infection.  Positive  results are indicative of active infection with SARS-CoV-2.  Clinical  correlation with patient history and other diagnostic information is  necessary to determine patient infection status.  Positive results do  not rule out bacterial infection or co-infection with other viruses. If result is PRESUMPTIVE POSTIVE SARS-CoV-2 nucleic acids MAY BE PRESENT.   A presumptive positive result was obtained on the submitted specimen  and confirmed on repeat testing.  While 2019 novel coronavirus  (SARS-CoV-2) nucleic acids may be present in the submitted sample  additional confirmatory testing may be  necessary for epidemiological  and / or clinical management purposes  to differentiate between  SARS-CoV-2 and other Sarbecovirus currently known to infect humans.  If clinically indicated additional testing with an alternate test  methodology [phone]) is advised. The SARS-CoV-2 RNA is generally  detectable in upper and lower respiratory sp ecimens during the acute  phase of infection. The expected result is Negative. Fact Sheet for Patients:  StrictlyIdeas.no Fact Sheet for Healthcare Providers: BankingDealers.co.za This test is not yet approved or cleared by the [place] FDA and has been authorized for detection and/or diagnosis of SARS-CoV-2 by FDA under an Emergency Use Authorization (EUA).  This EUA will remain in effect (meaning this test can be used) for the duration of the COVID-19 declaration under Section 564(b)(1) of the Act, 21 U.S.C. section 360bbb-3(b)(1), unless the authorization is terminated or revoked sooner. Performed at [facility], [address], [place], [place] [address]       Radiology Studies: Ct Head Wo Contrast  Result Date: 08/22/2019 CLINICAL DATA:  Encephalopathy. EXAM: CT HEAD WITHOUT CONTRAST TECHNIQUE: Contiguous axial images were obtained from the base of the skull through the vertex without intravenous contrast. COMPARISON:  08/10/2019 FINDINGS: Brain: There is no evidence of acute infarct, intracranial hemorrhage, mass, midline shift, or extra-axial fluid collection. The ventricles and sulci are within normal limits for age. Hypodensities in the cerebral white matter bilaterally are unchanged and nonspecific but compatible with mild chronic small vessel ischemic disease. 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 Vascular: Calcified atherosclerosis at the skull base. No hyperdense vessel. Skull: No fracture or focal osseous lesion. Sinuses/Orbits: Visualized  paranasal sinuses and mastoid air cells are clear. Bilateral cataract extraction is noted. Other: None. IMPRESSION: 1. No evidence of acute intracranial abnormality. 2. Mild chronic small vessel ischemic disease. Electronically Signed   By: Seymour Bars.D.  On: 08/22/2019 19:05        Scheduled Meds:  apixaban  5 mg Oral BID   carvedilol  3.125 mg Oral BID WC   Chlorhexidine Gluconate Cloth  6 each Topical Daily   cholecalciferol  2,000 Units Oral Daily   ferrous [id number] C-folic acid  1 capsule Oral BID PC   fluticasone  2 spray Each Nare Daily   insulin aspart  0-9 Units Subcutaneous TID WC   insulin detemir  14 Units Subcutaneous QHS   isosorbide mononitrate  30 mg Oral BID   levothyroxine  125 mcg Oral Daily   loratadine  10 mg Oral Daily   pantoprazole  40 mg Oral Daily   sodium chloride flush  3 mL Intravenous Q12H   umeclidinium-vilanterol  1 puff Inhalation Daily   Continuous Infusions:  diltiazem (CARDIZEM) infusion 10 mg/hr (08/24/19 1505)     LOS: 4 days    Time spent: 30 min    Ivor Costa, DO [facility] PAGER is on AMION  If 7PM-7AM, please contact night-coverage [email or web address] Password [email or web address] 08/24/2019, 3:25 PM

## 2019-08-24 NOTE — Evaluation (Signed)
Occupational Therapy Evaluation Patient Details Name: Destiny Zuniga MRN: [id number] DOB: 02-15-1941 Today's Date: 08/24/2019    History of Present Illness Patient is a 78 year old female admitted from SNF with AMS, acute renal failure. S/P hip fx and ORIF 08/11/19. PMH to include: COPD, Home O2, DM, HLD, CAD, CHF, HTN.   Clinical Impression   Pt seen for OT evaluation this date. Prior to hospital admission and recent hip fracture, pt reports she was independent with ADL tasks. Since fall and resulting fracture and surgical repair, pt was required significant assist for ADL and mobility at rehab. Currently pt demonstrates impairments in LLE strength and significant L hip pain. With bed mobility attempts to support skin care and wound care as well as ADL tasks, HR increasing to 127 with effort. Pt reporting mild dizziness upon rolling that resolved once back supine. Pt currently requires significant assist ADL and all aspects of mobility.  Pt would benefit from skilled OT to address noted impairments and functional limitations (see below for any additional details) in order to maximize safety and independence while minimizing falls risk and caregiver burden.  Upon hospital discharge, recommend pt discharge to SNF to continue therapy prior to safe return home.    Follow Up Recommendations  SNF    Equipment Recommendations  Other (comment)(bariatric BSC)    Recommendations for Other Services       Precautions / Restrictions Precautions Precautions: Fall Restrictions Weight Bearing Restrictions: No RLE Weight Bearing: Weight bearing as tolerated      Mobility Bed Mobility Overal bed mobility: Needs Assistance Bed Mobility: Rolling Rolling: Max assist         General bed mobility comments: with rolling attempts in preparation for sitting EOB pt with significant L hip pain  Transfers                      Balance                                            ADL either performed or assessed with clinical judgement   ADL Overall ADL's : Needs assistance/impaired Eating/Feeding: Bed level;Independent   Grooming: Bed level;Independent   Upper Body Bathing: Bed level;Minimal assistance   Lower Body Bathing: Bed level;Maximal assistance   Upper Body Dressing : Bed level;Minimal assistance   Lower Body Dressing: Bed level;Maximal assistance                       Vision Baseline Vision/History: Wears glasses Wears Glasses: Reading only Patient Visual Report: No change from baseline       Perception     Praxis      Pertinent Vitals/Pain Pain Assessment: 0-10 Pain Score: 4  Pain Location: L hip/LE Pain Descriptors / Indicators: Sore;Discomfort;Grimacing;Guarding Pain Intervention(s): Limited activity within patient's tolerance;Monitored during session;Premedicated before session;Repositioned     Hand Dominance Right   Extremity/Trunk Assessment Upper Extremity Assessment Upper Extremity Assessment: Overall WFL for tasks assessed   Lower Extremity Assessment Lower Extremity Assessment: LLE deficits/detail LLE: Unable to fully assess due to pain       Communication Communication Communication: No difficulties   Cognition Arousal/Alertness: Awake/alert Behavior During Therapy: WFL for tasks assessed/performed Overall Cognitive Status: Within Functional Limits for tasks assessed  General Comments       Exercises Other Exercises Other Exercises: bed mobility training   Shoulder Instructions      Home Living Family/patient expects to be discharged to:: Skilled nursing facility Living Arrangements: Spouse/significant other Available Help at Discharge: Family;Available PRN/intermittently Type of Home: House Home Access: Stairs to enter [employer] of Steps: 4   Home Layout: One level         Bathroom Toilet: Standard                 Prior Functioning/Environment Level of Independence: Independent        Comments: Occasional use of a SPC. Since hip fracture she has not been very mobile and has required significant assist for ADL        OT Problem List: Decreased strength;Decreased range of motion;Pain;Obesity      OT Treatment/Interventions: Self-care/ADL training;Therapeutic exercise;Therapeutic activities;DME and/or AE instruction;Patient/family education;Balance training    OT Goals(Current goals can be found in the care plan section) Acute Rehab OT Goals Patient Stated Goal: to go back home after rehab OT Goal Formulation: With patient Time For Goal Achievement: 09/07/19 Potential to Achieve Goals: Good ADL Goals Pt Will Perform Upper Body Dressing: sitting;with min guard assist Pt Will Perform Lower Body Dressing: with mod assist;sitting/lateral leans Pt Will Transfer to Toilet: with max assist;stand pivot transfer;bedside commode  OT Frequency: Min 1X/week   Barriers to D/C: Decreased caregiver support          Co-evaluation              AM-PAC OT "6 Clicks" Daily Activity     Outcome Measure Help from another person eating meals?: None Help from another person taking care of personal grooming?: None Help from another person toileting, which includes using toliet, bedpan, or urinal?: Total Help from another person bathing (including washing, rinsing, drying)?: A Lot Help from another person to put on and taking off regular upper body clothing?: A Little Help from another person to put on and taking off regular lower body clothing?: A Lot 6 Click Score: 16   End of Session    Activity Tolerance: Patient limited by pain Patient left: in bed;with call bell/phone within reach;with bed alarm set  OT Visit Diagnosis: Other abnormalities of gait and mobility (R26.89);Pain Pain - Right/Left: Left Pain - part of body: Hip                Time: 8144-8185 OT Time Calculation (min): 35  min Charges:  OT General Charges $OT Visit: 1 Visit OT Evaluation $OT Eval Moderate Complexity: 1 Mod OT Treatments $Therapeutic Activity: 8-22 mins  Jeni Salles, MPH, MS, OTR/L ascom [phone] 08/24/19, 8:53 AM

## 2019-08-24 NOTE — Progress Notes (Signed)
[facility]  ROUNDING NOTE   Subjective:  Patient seen at bedside. Overall doing much better. Complaining of some mild left hip pain today.   Objective:  Vital signs in last 24 hours:  Temp:  [97.6 F (36.4 C)-98.4 F (36.9 C)] 97.9 F (36.6 C) (11/06 0755) Pulse Rate:  [84-120] 101 (11/06 0755) Resp:  [18-19] 19 (11/06 0755) BP: (129-150)/(63-89) 150/84 (11/06 0755) SpO2:  [98 %-100 %] 98 % (11/06 0755) Weight:  [103.6 kg] 103.6 kg (11/06 0500)  Weight change: 0.316 kg Filed Weights   08/21/19 1606 08/23/19 0448 08/24/19 0500  Weight: 104.5 kg 103.3 kg 103.6 kg    Intake/Output: I/O last 3 completed shifts: In: 1520.6 [I.V.:1520.6] Out: 2700 [Urine:2700]   Intake/Output this shift:  No intake/output data recorded.  Physical Exam: General: No acute distress  Head: Normocephalic, atraumatic. Moist oral mucosal membranes  Eyes: Anicteric  Neck: Supple, trachea midline  Lungs:  Clear to auscultation, normal effort  Heart: S1S2 no rubs  Abdomen:  Soft, nontender, bowel sounds present  Extremities: 2+ peripheral edema.  Neurologic: Awake, alert, conversant  Skin: No lesions       Basic Metabolic Panel: Recent Labs  Lab 08/20/19 1543 08/20/19 2056 08/21/19 0513 08/22/19 1008 08/23/19 0432 08/24/19 0559  NA 126* 128* 131* 139 137 138  K 7.0* 5.2* 4.9 4.3 4.0 4.0  CL 90* 91* 94* 104 105 105  CO2 _0 GLUCOSE 116* 170* 128* 230* 212* 105*  BUN 97* 94* 100* 72* 50* 28*  CREATININE 5.21* 4.56* 4.47* 2.34* 1.40* 0.99  CALCIUM 8.3* 8.4* 8.3* 8.1* 8.3* 8.5*  MG 3.2*  --   --   --  2.1  --   PHOS  --  6.1* 6.5*  --   --   --     Liver Function Tests: Recent Labs  Lab 08/20/19 1543 08/20/19 2056 08/21/19 0513 08/23/19 0432 08/24/19 0559  AST 233*  --  150* 47* 45*  ALT 424*  --  323* 174* 139*  ALKPHOS 160*  --  136* 123 135*  BILITOT 1.8*  --  1.8* 1.5* 1.3*  PROT 5.7*  --  5.2* 5.0* 5.4*  ALBUMIN 2.8* 2.6* 2.5*  2.6*  2.4* 2.7*   No results for input(s): LIPASE, AMYLASE in the last 168 hours. Recent Labs  Lab 08/22/19 1829  AMMONIA 18    CBC: Recent Labs  Lab 08/20/19 1543 08/21/19 0513 08/22/19 1008 08/23/19 0432 08/24/19 0559  WBC 16.3* 12.1* 11.8* 9.6 10.9*  NEUTROABS 12.2*  --   --  7.0  --   HGB 9.6* 8.9* 9.2* 8.4* 9.3*  HCT 31.2* 28.7* 30.2* 27.8* 31.9*  MCV 82.5 81.1 83.7 84.0 87.2  PLT 265 223 248 225 258    Cardiac Enzymes: No results for input(s): CKTOTAL, CKMB, CKMBINDEX, TROPONINI in the last 168 hours.  BNP: Invalid input(s): POCBNP  CBG: Recent Labs  Lab 08/23/19 0741 08/23/19 1213 08/23/19 1640 08/23/19 2109 08/24/19 0756  GLUCAP 192* 156* 196* 152* 97    Microbiology: Results for orders placed or performed during the hospital encounter of 08/20/19  Urine culture     Status: None   Collection Time: 08/20/19  5:19 PM   Specimen: In/Out Cath Urine  Result Value Ref Range Status   Specimen Description   Final    IN/OUT CATH URINE Performed at [facility], [address], [place], [place] [address]    Special Requests  Final    NONE Performed at [facility], [address], [place], [place] [address]    Culture   Final    NO GROWTH Performed at [facility], [place] [address] [place], [place] [address]    Report Status 08/21/2019 FINAL  Final  Culture, blood (routine x 2)     Status: None (Preliminary result)   Collection Time: 08/20/19  5:21 PM   Specimen: BLOOD  Result Value Ref Range Status   Specimen Description BLOOD RIGHT ANTECUBITAL  Final   Special Requests   Final    BOTTLES DRAWN AEROBIC AND ANAEROBIC Blood Culture adequate volume   Culture   Final    NO GROWTH 3 DAYS Performed at [facility], [address], [place],  [address]    Report Status PENDING  Incomplete  SARS Coronavirus 2 by RT PCR (hospital order, performed in [place] hospital lab) Nasopharyngeal Nasopharyngeal Swab      Status: None   Collection Time: 08/20/19  5:47 PM   Specimen: Nasopharyngeal Swab  Result Value Ref Range Status   SARS Coronavirus 2 NEGATIVE NEGATIVE Final    Comment: (NOTE) If result is NEGATIVE SARS-CoV-2 target nucleic acids are NOT DETECTED. The SARS-CoV-2 RNA is generally detectable in upper and lower  respiratory specimens during the acute phase of infection. The lowest  concentration of SARS-CoV-2 viral copies this assay can detect is 250  copies / mL. A negative result does not preclude SARS-CoV-2 infection  and should not be used as the sole basis for treatment or other  patient management decisions.  A negative result may occur with  improper specimen collection / handling, submission of specimen other  than nasopharyngeal swab, presence of viral mutation(s) within the  areas targeted by this assay, and inadequate number of viral copies  (<250 copies / mL). A negative result must be combined with clinical  observations, patient history, and epidemiological information. If result is POSITIVE SARS-CoV-2 target nucleic acids are DETECTED. The SARS-CoV-2 RNA is generally detectable in upper and lower  respiratory specimens dur ing the acute phase of infection.  Positive  results are indicative of active infection with SARS-CoV-2.  Clinical  correlation with patient history and other diagnostic information is  necessary to determine patient infection status.  Positive results do  not rule out bacterial infection or co-infection with other viruses. If result is PRESUMPTIVE POSTIVE SARS-CoV-2 nucleic acids MAY BE PRESENT.   A presumptive positive result was obtained on the submitted specimen  and confirmed on repeat testing.  While 2019 novel coronavirus  (SARS-CoV-2) nucleic acids may be present in the submitted sample  additional confirmatory testing may be necessary for epidemiological  and / or clinical management purposes  to differentiate between  SARS-CoV-2 and other  Sarbecovirus currently known to infect humans.  If clinically indicated additional testing with an alternate test  methodology [phone]) is advised. The SARS-CoV-2 RNA is generally  detectable in upper and lower respiratory sp ecimens during the acute  phase of infection. The expected result is Negative. Fact Sheet for Patients:  StrictlyIdeas.no Fact Sheet for Healthcare Providers: BankingDealers.co.za This test is not yet approved or cleared by the [place] FDA and has been authorized for detection and/or diagnosis of SARS-CoV-2 by FDA under an Emergency Use Authorization (EUA).  This EUA will remain in effect (meaning this test can be used) for the duration of the COVID-19 declaration under Section 564(b)(1) of the Act, 21 U.S.C. section 360bbb-3(b)(1), unless  the authorization is terminated or revoked sooner. Performed at [facility], [address] [place], [place] [address]     Coagulation Studies: No results for input(s): LABPROT, INR in the last 72 hours.  Urinalysis: No results for input(s): COLORURINE, LABSPEC, PHURINE, GLUCOSEU, HGBUR, BILIRUBINUR, KETONESUR, PROTEINUR, UROBILINOGEN, NITRITE, LEUKOCYTESUR in the last 72 hours.  Invalid input(s): APPERANCEUR    Imaging: Ct Head Wo Contrast  Result Date: 08/22/2019 CLINICAL DATA:  Encephalopathy. EXAM: CT HEAD WITHOUT CONTRAST TECHNIQUE: Contiguous axial images were obtained from the base of the skull through the vertex without intravenous contrast. COMPARISON:  08/10/2019 FINDINGS: Brain: There is no evidence of acute infarct, intracranial hemorrhage, mass, midline shift, or extra-axial fluid collection. The ventricles and sulci are within normal limits for age. Hypodensities in the cerebral white matter bilaterally are unchanged and nonspecific but compatible with mild chronic small vessel ischemic disease. There is likely a chronic lacunar infarct in the  right lentiform nucleus. Vascular: Calcified atherosclerosis at the skull base. No hyperdense vessel. Skull: No fracture or focal osseous lesion. Sinuses/Orbits: Visualized paranasal sinuses and mastoid air cells are clear. Bilateral cataract extraction is noted. Other: None. IMPRESSION: 1. No evidence of acute intracranial abnormality. 2. Mild chronic small vessel ischemic disease. Electronically Signed   By: Logan Bores M.D.   On: 08/22/2019 19:05     Medications:    . aspirin EC  81 mg Oral Daily  . carvedilol  3.125 mg Oral BID WC  . Chlorhexidine Gluconate Cloth  6 each Topical Daily  . cholecalciferol  2,000 Units Oral Daily  . clopidogrel  75 mg Oral Daily  . ferrous [id number] C-folic acid  1 capsule Oral BID PC  . fluticasone  2 spray Each Nare Daily  . heparin  5,000 Units Subcutaneous Q8H  . insulin aspart  0-9 Units Subcutaneous TID WC  . insulin detemir  14 Units Subcutaneous QHS  . isosorbide mononitrate  30 mg Oral BID  . levothyroxine  125 mcg Oral Daily  . loratadine  10 mg Oral Daily  . pantoprazole  40 mg Oral Daily  . sodium chloride flush  3 mL Intravenous Q12H  . umeclidinium-vilanterol  1 puff Inhalation Daily   acetaminophen **OR** acetaminophen, albuterol, hydrALAZINE, nitroGLYCERIN, ondansetron **OR** ondansetron (ZOFRAN) IV, oxyCODONE  Assessment/ Plan:  78 y.o. female with a PMHx of COPD, diabetes mellitus type 2, pulmonary hypertension, tricuspid regurgitation, congestive heart failure, recent left hip fracture, who was admitted to [facility] on 08/20/2019 for evaluation of altered mental status.   1.  Acute kidney injury/chronic kidney disease stage IIIa.  Baseline EGFR 45.    Renal function significantly improved.  BUN down to 28 with a creatinine of 0.99.  IV fluids have been stopped..  2.  Hyponatremia.  Resolved, serum sodium currently 138.  3.  Anemia of chronic kidney disease.  Hemoglobin up to 9.3.  Hold off on Epogen at this time.   LOS:  4 Emmett Bracknell 11/6/20209:39 AM

## 2019-08-24 NOTE — Progress Notes (Signed)
PT Cancellation Note  Patient Details Name: Nathaly Dawkins MRN: [id number] DOB: 1940/11/14   Cancelled Treatment:    Reason Eval/Treat Not Completed: Patient at procedure or test/unavailable;  Upon entering room pt's nurse present and stated pt had recent episode of chest pain and was preparing to receive an EKG.  Will hold PT session this date and will attempt to see pt at a future date as medically appropriate.     Linus Salmons PT, DPT 08/24/19, 11:49 AM

## 2019-08-24 NOTE — Progress Notes (Signed)
Pt was very anxious and calling her daughter stating" I'm so scared, I feel so alone, and I feel like Im gonna die tonight." I talked to the daughter Lattie Haw and states il talked to the [facility] and see if they be able to allow 1 family member to stay with the pt. Talked to [facility] and states its okay for 1 family member to stay just for tonight. Called the daughter and informed her that [facility] allow 1 family member to stay with her and instructed that only 1 family member is allowed and they need to be wearing mask at all times and need to stay with pt at all times. Daughter states she understand and states she either will send her son or her daughter in-law. Will continue to monitor.

## 2019-08-24 NOTE — TOC Initial Note (Signed)
Transition of Care [facility]) - Initial/Assessment Note    Patient Details  Name: Destiny Zuniga MRN: [id number] Date of Birth: 06/29/1941  Transition [facility]) CM/SW Contact:    Ross Ludwig, LCSW Phone Number: 08/24/2019, 5:52 PM  Clinical Narrative:                  Patient is a 78 year old female who was at [facility] receiving some short term rehab.  Patient states she feels like she is progressing well, however she is not sure if she wants to return back to [facility] or go to a different facility.  Patient's daughter was on the phone, and also stated she is not sure if she wants patient to return to same SNF or if she wants her to go to one that is closer to where she lives in [place].  CSW was given permission to look at different facilities for SNF placement.  CSW to follow up with patient and her daughter.  Expected Discharge Plan: Skilled Nursing Facility Barriers to Discharge: Continued Medical Work up   Patient Goals and CMS Choice Patient states their goals for this hospitalization and ongoing recovery are:: To return to SNF to continue with therapy. CMS Medicare.gov Compare Post Acute Care list provided to:: Patient Choice offered to / list presented to : Adult Children, [place] / Guardian  Expected Discharge Plan and Services Expected Discharge Plan: [place] In-house Referral: NA Discharge Planning Services: NA Post Acute Care Choice: [place] Living arrangements for the past 2 months: [place], [place]                 DME Arranged: N/A DME Agency: NA                  Prior Living Arrangements/Services Living arrangements for the past 2 months: [place], [place] Lives with:: Significant Other Patient language and need for interpreter reviewed:: Yes Do you feel safe going back to the place where you live?: No   Patient is not sure if she wants  to return back to the same SNF or go to a different one.  Need for Family Participation in Patient Care: Yes (Comment) Care giver support system in place?: Yes (comment)   Criminal Activity/Legal Involvement Pertinent to Current Situation/Hospitalization: No - Comment as needed  Activities of Daily Living Home Assistive Devices/Equipment: Eyeglasses, Dentures (specify type), Oxygen, CBG Meter ADL Screening (condition at time of admission) Patient's cognitive ability adequate to safely complete daily activities?: Yes Is the patient deaf or have difficulty hearing?: Yes Does the patient have difficulty seeing, even when wearing glasses/contacts?: No Does the patient have difficulty concentrating, remembering, or making decisions?: No Patient able to express need for assistance with ADLs?: No Does the patient have difficulty dressing or bathing?: No Independently performs ADLs?: Yes (appropriate for developmental age) Does the patient have difficulty walking or climbing stairs?: Yes Weakness of Legs: Both Weakness of Arms/Hands: None  Permission Sought/Granted Permission sought to share information with : Facility Sport and exercise psychologist, Family Supports Permission granted to share information with : Yes, Verbal Permission Granted  Share Information with NAME: Thornell Sartorius   [phone] or CONLEY,DON Significant other   [phone]  Permission granted to share info w AGENCY: SNF admissions        Emotional Assessment Appearance:: Appears stated age Attitude/Demeanor/Rapport: Engaged Affect (typically observed): Accepting, Appropriate, Calm, Anxious, Stable Orientation: : Oriented  to Self, Oriented to Place, Oriented to  Time Alcohol / Substance Use: Not Applicable Psych Involvement: No (comment)  Admission diagnosis:  Hyperkalemia [E87.5] Elevated liver enzymes [R74.8] Acute renal failure (ARF) (HCC) [N17.9] Acute renal failure, unspecified acute renal failure type (HCC)  [N17.9] Altered mental status, unspecified altered mental status type [R41.82] Patient Active Problem List   Diagnosis Date Noted  . New onset atrial fibrillation with RVR 08/24/2019  . Acute renal failure superimposed on stage 3a chronic kidney disease (HCC) 08/22/2019  . Altered mental status   . Elevated liver enzymes   . Acute renal failure (ARF) (HCC) 08/20/2019  . COPD (chronic obstructive pulmonary disease) (HCC)   . Type II diabetes mellitus with renal manifestations (HCC)   . Hyperlipidemia   . Hyperkalemia   . Hyponatremia   . Bradycardia   . Coronary artery disease   . Chronic diastolic CHF (congestive heart failure) (HCC)   . Hypothyroidism   . Hypertension associated with diabetes (HCC)   . Hip fracture (HCC) 08/10/2019   PCP:  Lauro Regulus, MD Pharmacy:   [facility] [phone] - [place], [place] - [address] [facility] [address] [place] [address] Phone: [phone] Fax: [phone]     Social Determinants of Health (SDOH) Interventions    Readmission Risk Interventions Readmission Risk Prevention Plan 08/24/2019  Transportation Screening Complete  PCP or Specialist Appt within 3-5 Days Complete  HRI or Home Care Consult Complete  Social Work Consult for Recovery Care Planning/Counseling Complete  Palliative Care Screening Not Applicable  Medication Review Oceanographer) Referral to Pharmacy

## 2019-08-24 NOTE — Consult Note (Signed)
Cardiology Consultation:   Patient ID: Destiny Zuniga; [id number]; [birth year]   Admit date: 08/20/2019 Date of Consult: 08/24/2019  Primary Care Provider: Lauro RegulusAnderson, Marshall W, MD Primary Cardiologist: [facility]   Patient Profile:   Destiny Zuniga is a 78 y.o. female with a hx of CAD status post stenting to the LAD and LCx with details being unclear, HFpEF, pulmonary hypertension, chronic hypoxic respiratory failure on supplemental oxygen at 3 L via nasal cannula, COPD, insulin-dependent diabetes, HTN, HLD, hypothyroidism, morbid obesity, and recent admission for left hip fracture status post surgical repair on 08/11/2019 discharged on aspirin/Plavix for VTE prophylaxis in the setting of mechanical fall versus syncope who is being seen today for the evaluation of new onset Afib with RVR at the request of Dr. Clyde LundborgNiu.  History of Present Illness:   Ms. Destiny [facility]  Prior echo from 09/2018 demonstrated an EF of 60 to 65%, grade 2 diastolic dysfunction, moderately dilated RV, grossly normal RV systolic function, mild mitral regurgitation, mildly dilated right atrium, moderate to severe tricuspid regurgitation, PASP 74 mmHg.  She was seen by her primary cardiology group through [facility] in early 07/2019 with complaints of lightheadedness and shortness of breath.  In this setting, she underwent 48-hour Holter monitoring with her primary cardiologist in late 07/2019 with strips showing sinus bradycardia and sinus rhythm with PACs and PVCs.  There was a short episode of tachycardia (?  Artifact) of which the etiology could not be determined.  There were no sustained ventricular arrhythmias.  Patient was subsequently admitted to the hospital from 10/23 through 10/27 for a left hip fracture status post ORIF on 08/11/2019.  It is unclear what led to the patient's fall as it appears that she was stepping over a curb and was reaching for a door when she  got lightheaded and fell.  Notes indicate there was loss of consciousness.  Further details are unclear as patient does not recall the event.  She was discharged to a SNF.  She returned to the hospital on 11/2 with altered mental status and was found to have acute on chronic kidney injury with a serum creatinine of 5.21 (serum creatinine 1.15 on 08/13/2019).  Renal ultrasound demonstrated no hydronephrosis.  She was evaluated by nephrology with no urgent indication for dialysis.  With holding of losartan and Lasix along with IV fluids renal function subsequently improved back to her baseline.  Hyperkalemia resolved with Lokelma.  CT head was nonacute.  She did have a mild leukocytosis though there was no clear evidence of infection.  She was planning to be discharged today though was noted to be in new onset A. fib with RVR, subsequently leading to the cancellation of her discharge.  In talking with the patient today she reported tachypalpitations throughout the day on 11/5 that have subsequently improved somewhat.  There has been some associated shortness of breath.  No chest pain, dizziness, presyncope, syncope.  She has stable lower extremity swelling bilaterally.  Patient was noted to be markedly bradycardic upon admission with heart rate of 42 bpm leading to her Coreg being held at time of admission.  Review of telemetry indicated the patient developed A. fib with RVR around 1 AM on 11/5 and has maintained mildly tachycardic rates ranging from the 1 teens to 120s bpm leading to the cancellation of her discharge.  She remains in A. fib with RVR with ventricular rates in the 120s bpm and notes mild tachypalpitations.  Otherwise, she is without cardiac complaint.    Past Medical History:  Diagnosis Date   Arthritis    CHF (congestive heart failure) (HCC)    COPD (chronic obstructive pulmonary disease) (HCC)    Diabetes mellitus without complication (HCC)    Pulmonary hypertension (HCC)     Tricuspid regurgitation     Past Surgical History:  Procedure Laterality Date   CARDIAC SURGERY     INTRAMEDULLARY (IM) NAIL INTERTROCHANTERIC Left 08/11/2019   Procedure: INTRAMEDULLARY (IM) NAIL INTERTROCHANTRIC with wound vac application;  Surgeon: Kennedy Bucker, MD;  Location: [facility] ORS;  Service: Orthopedics;  Laterality: Left;     Home Meds: Prior to Admission medications   Medication Sig Start Date End Date Taking? Authorizing Provider  albuterol (VENTOLIN HFA) 108 (90 Base) MCG/ACT inhaler Inhale 2 puffs into the lungs every 6 (six) hours as needed for wheezing or shortness of breath. 05/15/19  Yes [provider]  aspirin 325 MG tablet Take 325 mg by mouth 2 (two) times daily.   Yes [provider]  carvedilol (COREG) 3.125 MG tablet Take 3.125 mg by mouth 2 (two) times daily. 12/25/18 12/25/19 Yes [provider]  cetirizine (ZYRTEC) 10 MG tablet Take 10 mg by mouth daily as needed for allergies.    Yes [provider]  cholecalciferol (VITAMIN D3) 25 MCG (1000 UT) tablet Take 2,000 Units by mouth daily.   Yes [provider]  clopidogrel (PLAVIX) 75 MG tablet Take 75 mg by mouth daily. 02/10/19  Yes [provider]  diclofenac sodium (VOLTAREN) 1 % GEL Apply 4 g topically 3 (three) times daily as needed (joint pain). (apply to hands and knees) 04/04/19  Yes [provider]  ferrous fumarate-b12-vitamic C-folic acid (TRINSICON / FOLTRIN) capsule Take 1 capsule by mouth 2 (two) times daily after a meal. 08/14/19  Yes Enedina Finner, MD  fluticasone (FLONASE) 50 MCG/ACT nasal spray Place 2 sprays into both nostrils daily. 12/06/18  Yes [provider]  Fluticasone-Umeclidin-Vilant (TRELEGY ELLIPTA) 100-62.5-25 MCG/INH AEPB Inhale 1 puff into the lungs daily.   Yes [provider]  gabapentin (NEURONTIN) 600 MG tablet Take 1,200-1,800 mg by mouth See admin instructions. Take 2 tablets (1200mg ) by mouth every morning  and take 3 tablets (1800mg ) by mouth every night 11/14/18  Yes [provider]  insulin aspart (NOVOLOG) 100 UNIT/ML injection Inject 20 Units into the skin 3 (three) times daily with meals.  03/23/19  Yes [provider]  isosorbide mononitrate (IMDUR) 30 MG 24 hr tablet Take 30 mg by mouth 2 (two) times daily.  03/06/19  Yes [provider]  levothyroxine (SYNTHROID) 125 MCG tablet Take 125 mcg by mouth daily. 02/19/19  Yes [provider]  losartan (COZAAR) 100 MG tablet Take 100 mg by mouth daily. 12/15/18  Yes [provider]  nitroGLYCERIN (NITROLINGUAL) 0.4 MG/SPRAY spray Place 1 spray under the tongue every 5 (five) minutes as needed for chest pain. 12/20/18  Yes [provider]  omeprazole (PRILOSEC) 20 MG capsule Take 20 mg by mouth daily. 12/15/18  Yes [provider]  ondansetron (ZOFRAN) 4 MG tablet Take 4 mg by mouth daily as needed for nausea or vomiting.   Yes [provider]  oxyCODONE (ROXICODONE) 5 MG immediate release tablet Take 1-2 tablets (5-10 mg total) by mouth every 4 (four) hours as needed for severe pain. 08/14/19  Yes 12/17/18, PA-C  potassium chloride (KLOR-CON) 10 MEQ tablet Take 10 mEq by mouth daily. 04/03/19  Yes [provider]  simvastatin (ZOCOR) 20 MG tablet Take 20 mg by mouth daily. 01/21/19  Yes [provider]  furosemide (LASIX) 20 MG tablet Take 1 tablet (20 mg total) by mouth 2 (two) times daily. 08/24/19   Lorretta Harp, MD  Insulin Detemir (LEVEMIR) 100 UNIT/ML Pen Inject 14 Units into the skin daily with supper. 08/24/19   Lorretta Harp, MD  traMADol (ULTRAM) 50 MG tablet Take 1 tablet (50 mg total) by mouth every 6 (six) hours as needed for moderate pain. 08/24/19   Lorretta Harp, MD    Inpatient Medications: Scheduled Meds:  apixaban  5 mg Oral BID   carvedilol  3.125 mg Oral BID WC   Chlorhexidine Gluconate Cloth  6 each Topical Daily   cholecalciferol  2,000 Units  Oral Daily   ferrous fumarate-b12-vitamic C-folic acid  1 capsule Oral BID PC   fluticasone  2 spray Each Nare Daily   insulin aspart  0-9 Units Subcutaneous TID WC   insulin detemir  14 Units Subcutaneous QHS   isosorbide mononitrate  30 mg Oral BID   levothyroxine  125 mcg Oral Daily   loratadine  10 mg Oral Daily   pantoprazole  40 mg Oral Daily   sodium chloride flush  3 mL Intravenous Q12H   umeclidinium-vilanterol  1 puff Inhalation Daily   Continuous Infusions:  diltiazem (CARDIZEM) infusion     PRN Meds: acetaminophen **OR** acetaminophen, albuterol, hydrALAZINE, hydrOXYzine, nitroGLYCERIN, ondansetron **OR** ondansetron (ZOFRAN) IV, oxyCODONE  Allergies:   Allergies  Allergen Reactions   Codeine Anaphylaxis   Midazolam Other (See Comments)    Altered mental status     Celecoxib Other (See Comments)    Excessive bleeding    Lisinopril Cough    Social History:   Social History   Socioeconomic History   Marital status: Widowed    Spouse name: Not on file   Number of children: Not on file   Years of education: Not on file   Highest education level: Not on file  Occupational History   Not on file  Social Needs   Financial resource strain: Not on file   Food insecurity    Worry: Not on file    Inability: Not on file   Transportation needs    Medical: Not on file    Non-medical: Not on file  Tobacco Use   Smoking status: Former Smoker   Smokeless tobacco: Never Used  Substance and Sexual Activity   Alcohol use: Not Currently   Drug use: Never   Sexual activity: Yes  Lifestyle   Physical activity    Days per week: Not on file    Minutes per session: Not on file   Stress: Not on file  Relationships   Social connections    Talks on phone: Not on file    Gets together: Not on file    Attends religious service: Not on file    Active member of club or organization: Not on file    Attends meetings of clubs or organizations:  Not on file    Relationship status: Not on file   Intimate partner violence    Fear of current or ex partner: Not on file    Emotionally abused: Not on file    Physically abused: Not on file    Forced sexual activity: Not on file  Other Topics Concern   Not on file  Social History Narrative   Not on file     Family  History:   Family History  Problem Relation Age of Onset   Heart disease Mother     ROS:  Review of Systems  Constitutional: Positive for malaise/fatigue. Negative for chills, diaphoresis, fever and weight loss.  HENT: Negative for congestion.   Eyes: Negative for discharge and redness.  Respiratory: Positive for shortness of breath. Negative for cough, hemoptysis, sputum production and wheezing.   Cardiovascular: Positive for palpitations and leg swelling. Negative for chest pain, orthopnea, claudication and PND.  Gastrointestinal: Negative for abdominal pain, blood in stool, heartburn, melena, nausea and vomiting.  Genitourinary: Negative for hematuria.  Musculoskeletal: Positive for falls and joint pain. Negative for myalgias.  Skin: Negative for rash.  Neurological: Positive for weakness. Negative for dizziness, tingling, tremors, sensory change, speech change and focal weakness.       Uncertain if loss of consciousness occurred leading up to her admission in 07/2019  Endo/Heme/Allergies: Does not bruise/bleed easily.  Psychiatric/Behavioral: Negative for substance abuse. The patient is not nervous/anxious.   All other systems reviewed and are negative.     Physical Exam/Data:   Vitals:   08/24/19 0500 08/24/19 0504 08/24/19 0755 08/24/19 1138  BP:  132/63 (!) 150/84   Pulse:  84 (!) 101 78  Resp:  18 19   Temp:  98.4 F (36.9 C) 97.9 F (36.6 C)   TempSrc:  Oral    SpO2:  98% 98%   Weight: 103.6 kg     Height:        Intake/Output Summary (Last 24 hours) at 08/24/2019 1416 Last data filed at 08/24/2019 1027 Gross per 24 hour  Intake --   Output 1650 ml  Net -1650 ml   Filed Weights   08/21/19 1606 08/23/19 0448 08/24/19 0500  Weight: 104.5 kg 103.3 kg 103.6 kg   Body mass index is 41.77 kg/m.   Physical Exam: General: Well developed, well nourished, in no acute distress. Head: Normocephalic, atraumatic, sclera non-icteric, no xanthomas, nares without discharge.  Neck: Negative for carotid bruits. JVD not elevated. Lungs: Faint rales along bilateral bases. Breathing is unlabored. Heart: Tachycardic, irregularly irregular with S1 S2. II/VI systolic murmur LUSB, no rubs, or gallops appreciated. Abdomen: Obese soft, non-tender, non-distended with normoactive bowel sounds. No hepatomegaly. No rebound/guarding. No obvious abdominal masses. Msk:  Strength and tone appear normal for age. Extremities: No clubbing or cyanosis.  Trace bilateral pretibial edema. Distal pedal pulses are 2+ and equal bilaterally. Neuro: Alert and oriented X 3. No facial asymmetry. No focal deficit. Moves all extremities spontaneously. Psych:  Responds to questions appropriately with a normal affect.   EKG:  The EKG was personally reviewed and demonstrates: 11/2 -marked sinus bradycardia, 42 bpm, baseline wandering, no acute ST-T changes.  EKG 11/6 pending Telemetry:  Telemetry was personally reviewed and demonstrates: Sinus rhythm/bradycardia with development of A. fib with RVR around 1 AM on 11/5 with ventricular rates currently in the 120s bpm  Weights: Filed Weights   08/21/19 1606 08/23/19 0448 08/24/19 0500  Weight: 104.5 kg 103.3 kg 103.6 kg    Relevant CV Studies:  2D echo 09/2018: The left ventricle is normal in size, wall thickness and wall motion with ejection fraction of 60-65%. Grade II moderate diastolic dysfunction; pseudonormal mitral inflow pattern. The right ventricle is moderately dilated. The right ventricular ejection fraction is grossly normal. The right atrium is mildly dilated. The mitral valve is mildly thickened  with mild [1+] regurgitation. There is moderately severe (3+) tricuspid regurgitation. Right ventricular systolic pressure is  elevated at 74mmHg, with moderately severe pulmonary hypertension. Mild aortic sclerosis is present with good valvular opening. The aortic valve is trileaflet. There is no pericardial effusion.   Laboratory Data:  Chemistry Recent Labs  Lab 08/22/19 1008 08/23/19 0432 08/24/19 0559  NA 139 137 138  K 4.3 4.0 4.0  CL 104 105 105  CO2 24 23 25   GLUCOSE 230* 212* 105*  BUN 72* 50* 28*  CREATININE 2.34* 1.40* 0.99  CALCIUM 8.1* 8.3* 8.5*  GFRNONAA 19* 36* 54*  GFRAA 22* 41* >60  ANIONGAP 11 9 8     Recent Labs  Lab 08/21/19 0513 08/23/19 0432 08/24/19 0559  PROT 5.2* 5.0* 5.4*  ALBUMIN 2.5*   2.6* 2.4* 2.7*  AST 150* 47* 45*  ALT 323* 174* 139*  ALKPHOS 136* 123 135*  BILITOT 1.8* 1.5* 1.3*   Hematology Recent Labs  Lab 08/22/19 1008 08/23/19 0432 08/24/19 0559  WBC 11.8* 9.6 10.9*  RBC 3.61* 3.31* 3.66*  HGB 9.2* 8.4* 9.3*  HCT 30.2* 27.8* 31.9*  MCV 83.7 84.0 87.2  MCH 25.5* 25.4* 25.4*  MCHC 30.5 30.2 29.2*  RDW 20.7* 21.0* 22.4*  PLT 248 225 258   Cardiac EnzymesNo results for input(s): TROPONINI in the last 168 hours. No results for input(s): TROPIPOC in the last 168 hours.  BNP Recent Labs  Lab 08/20/19 1543  BNP 280.0*    DDimer No results for input(s): DDIMER in the last 168 hours.  Radiology/Studies:  Ct Head Wo Contrast  Result Date: 08/22/2019 IMPRESSION: 1. No evidence of acute intracranial abnormality. 2. Mild chronic small vessel ischemic disease. Electronically Signed   By: Logan Bores M.D.   On: 08/22/2019 19:05   US Renal  Result Date: 08/21/2019 IMPRESSION: Slight amount perinephric fluid on the right. Kidneys otherwise appear normal bilaterally. Foley catheter in place with small amount of urine remaining in the urinary bladder. Electronically Signed   By: Lowella Grip III M.D.   On: 08/21/2019 07:21    Dg Chest Portable 1 View  Result Date: 08/20/2019 IMPRESSION: No acute cardiopulmonary findings. Electronically Signed   By: Davina Poke M.D.   On: 08/20/2019 15:57   US Liver Doppler  Result Date: 08/22/2019 IMPRESSION: Normal liver Doppler ultrasound. Trace bilateral pleural effusions noted incidentally. Electronically Signed   By: Jacqulynn Cadet M.D.   On: 08/22/2019 10:55    Assessment and Plan:   1. New onset Afib with RVR: -Patient developed Afib with RVR around 1 AM on 11/5 and has been in this rhythm with tachycardic rates since -She has been intermittently symptomatic with tachypalpitatons -Ventriclar rates remain suboptimally controlled -Start diltiazem gtt for rate control  -She was documented to be sinus bradycardia upon admission leading her Coreg to be held at that time -With the development of Afib with RVR, low dose Coreg has been resumed, if needed this can be changed to metoprolol for added rate control -Her heart rates will need to be monitored closely given bradycardic rate noted while in sinus upon admission with tachycardic rates in Afib, may need to see EP for tachy-brady syndrome  -Recent Holter with [facility] did potentially show a tachycardic rate vs artifact, with further etiology unable to be determined based on quality  -Long term, given her body habitus with likely component of dependent edema, CCB is not likely ideal and would like to escalate beta blocker therapy  -Start Eliquis 5 mg bid given her CHADS2VASc of at least 7 (CHF, HTN, age x 2, DM,  vascular disease, female) -No recent stenting, therefore we will stop ASA and Plavix to minimize her bleeding risk, especially with her underlying anemia -TSH normal on 08/20/2019 -Potassium and magnesium at goal -Check echo (recent echo through [facility] in 07/2019, though unable to see the report)  2.  Prior mechanical fall versus syncope: -Patient admitted in late 07/2019 with fall versus syncope with  subsequent fractured left hip requiring surgical repair -It appears patient had recently undergone echo and 48-hour Holter monitoring by her primary cardiologist in the days leading up to her presentation in late 07/2019 -Etiology of potential syncope remains uncertain though we cannot exclude potential posttermination pause given recent diagnosis of A. Fib -It appears her 48-hour Holter monitor did not show any significant sustained ventricular arrhythmias -Check echo as above -Would ideally benefit from ischemic evaluation given known CAD with mild troponin elevation as outlined below, timing of this is to be determined and may be deferred to patient's primary cardiologist through [facility] -No significant arrhythmia or pauses noted on telemetry during admission -She was significantly bradycardic upon admission -Could consider repeat outpatient cardiac monitoring at time of discharge, will discuss with MD  3. CAD involving the native coronary arteries without angina with elevated troponin: -High-sensitivity troponin minimally elevated at 110 with a delta of 85 not consistent with ACS -Likely demand ischemia in the setting of acute renal failure/anemia -No recent stenting -Was on Plavix as an outpatient -Stop ASA and Plavix as above given initiation of Eliquis -Imdur -Statin on hold as below -Check echo as above -Will need an ischemic evaluation with timing to be determined based on echo  4. HFpEF/pulmonary hypertension: -She does appear somewhat volume up on exam at this time, likely exacerbated by Afib with RVR and recent IV fluids in the setting of AKI -Given she was just recently treated with ARF, will hold off on IV Lasix this afternoon, consider gentle diuresis on 11/7 (discussed with MD) -Exclude some degree of third spacing with regards to crackles heard on exam and lower extremity swelling secondary to hypoalbuminemia and anemia -Discontinue IV fluids  5.  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  Anemia: -Likely multifactorial including recent expected acute blood loss anemia following surgical repair of hip fracture along with component of anemia of chronic disease -Stable  7.  HTN: -Losartan Lasix on hold as above -Coreg, Imdur, diltiazem drip -As needed hydralazine  8.  Transaminitis -Improving -Simvastatin on hold   For questions or updates, please contact [facility] Please consult [email or web address] for contact info under Cardiology/STEMI.   Signed, Eula Listen, PA-C [facility] Pager: [phone] 08/24/2019, 2:16 PM

## 2019-08-24 NOTE — Plan of Care (Signed)
?  Problem: Clinical Measurements: ?Goal: Ability to maintain clinical measurements within normal limits will improve ?Outcome: Progressing ?Goal: Respiratory complications will improve ?Outcome: Progressing ?  ?Problem: Pain Managment: ?Goal: General experience of comfort will improve ?Outcome: Progressing ?  ?Problem: Safety: ?Goal: Ability to remain free from injury will improve ?Outcome: Progressing ?  ?

## 2019-08-24 NOTE — Progress Notes (Addendum)
Pt c/o chest tightness/ no distress noted/ Pt noted to be in afib on tele- rate controlled (70-90s) - VSS/ CCMD reports that pt has been in afib since 03:00am/  no hx of afib reported per pt and pts daughter/ Dr. Blaine Hamper made aware/ will obtain EKG and cardio consult/ continue to monitor.

## 2019-08-25 ENCOUNTER — Inpatient Hospital Stay: Admit: 2019-08-25 | Payer: Medicare Other

## 2019-08-25 LAB — GLUCOSE, CAPILLARY
Glucose-Capillary: 78 mg/dL (ref 70–99)
Glucose-Capillary: 182 mg/dL — ABNORMAL HIGH (ref 70–99)
Glucose-Capillary: 172 mg/dL — ABNORMAL HIGH (ref 70–99)
Glucose-Capillary: 204 mg/dL — ABNORMAL HIGH (ref 70–99)

## 2019-08-25 LAB — BASIC METABOLIC PANEL
Anion gap: 8 (ref 5–15)
BUN: 20 mg/dL (ref 8–23)
CO2: 24 mmol/L (ref 22–32)
Calcium: 8.8 mg/dL — ABNORMAL LOW (ref 8.9–10.3)
Chloride: 107 mmol/L (ref 98–111)
Creatinine, Ser: 0.96 mg/dL (ref 0.44–1.00)
GFR calc Af Amer: >60 mL/min (ref >60)
GFR calc non Af Amer: 56 mL/min — ABNORMAL LOW (ref >60)
Glucose, Bld: 104 mg/dL — ABNORMAL HIGH (ref 70–99)
Potassium: 4.2 mmol/L (ref 3.5–5.1)
Sodium: 139 mmol/L (ref 135–145)

## 2019-08-25 LAB — CBC
HCT: 31.9 % — ABNORMAL LOW (ref 36.0–46.0)
Hemoglobin: 9.5 g/dL — ABNORMAL LOW (ref 12.0–15.0)
MCH: 25.8 pg — ABNORMAL LOW (ref 26.0–34.0)
MCHC: 29.8 g/dL — ABNORMAL LOW (ref 30.0–36.0)
MCV: 86.7 fL (ref 80.0–100.0)
Platelets: 250 10*3/uL (ref 150–400)
RBC: 3.68 MIL/uL — ABNORMAL LOW (ref 3.87–5.11)
RDW: 23.2 % — ABNORMAL HIGH (ref 11.5–15.5)
WBC: 10.7 10*3/uL — ABNORMAL HIGH (ref 4.0–10.5)
nRBC: 0 % (ref 0.0–0.2)

## 2019-08-25 LAB — MAGNESIUM: Magnesium: 1.8 mg/dL (ref 1.7–2.4)

## 2019-08-25 MED ORDER — POLYVINYL ALCOHOL 1.4 % OP SOLN
1.0000 [drp] | OPHTHALMIC | Status: DC | PRN
  Administered 2019-08-26: 1 [drp] via OPHTHALMIC
  Filled 2019-08-25: qty 15

## 2019-08-25 MED ORDER — FUROSEMIDE 20 MG PO TABS
20.0000 mg | ORAL_TABLET | Freq: Every day | ORAL | Status: DC
  Administered 2019-08-25 – 2019-08-27 (×3): 20 mg via ORAL
  Filled 2019-08-25 (×3): qty 1

## 2019-08-25 MED ORDER — MAGNESIUM SULFATE 2 GM/50ML IV SOLN
2.0000 g | Freq: Once | INTRAVENOUS | Status: AC
  Administered 2019-08-25: 2 g via INTRAVENOUS
  Filled 2019-08-25: qty 50

## 2019-08-25 MED ORDER — DILTIAZEM HCL 30 MG PO TABS
30.0000 mg | ORAL_TABLET | Freq: Three times a day (TID) | ORAL | Status: DC
  Administered 2019-08-25 – 2019-08-26 (×3): 30 mg via ORAL
  Filled 2019-08-25 (×3): qty 1

## 2019-08-25 NOTE — Progress Notes (Signed)
[facility]  ROUNDING NOTE   Subjective:  Renal function continues to improve. BUN down to 20 with a creatinine of 0.96. Developed atrial fibrillation, now on heparin gtt.   Objective:  Vital signs in last 24 hours:  Temp:  [97.8 F (36.6 C)-98.6 F (37 C)] 97.8 F (36.6 C) (11/07 0530) Pulse Rate:  [64-130] 70 (11/07 0530) Resp:  [18-19] 18 (11/07 0530) BP: (123-152)/(47-91) 131/47 (11/07 0530) SpO2:  [95 %-99 %] 98 % (11/07 0530) Weight:  [104.3 kg] 104.3 kg (11/07 0530)  Weight change: 0.7 kg Filed Weights   08/23/19 0448 08/24/19 0500 08/25/19 0530  Weight: 103.3 kg 103.6 kg 104.3 kg    Intake/Output: I/O last 3 completed shifts: In: 60.7 [I.V.:60.7] Out: 1901 [Urine:1900; Stool:1]   Intake/Output this shift:  No intake/output data recorded.  Physical Exam: General: No acute distress  Head: Normocephalic, atraumatic. Moist oral mucosal membranes  Eyes: Anicteric  Neck: Supple, trachea midline  Lungs:  Clear to auscultation, normal effort  Heart: S1S2 irregular  Abdomen:  Soft, nontender, bowel sounds present  Extremities: 2+ peripheral edema.  Neurologic: Awake, alert, conversant  Skin: No lesions       Basic Metabolic Panel: Recent Labs  Lab 08/20/19 1543 08/20/19 2056 08/21/19 0513 08/22/19 1008 08/23/19 0432 08/24/19 0559 08/25/19 0450  NA 126* 128* 131* 139 137 138 139  K 7.0* 5.2* 4.9 4.3 4.0 4.0 4.2  CL 90* 91* 94* 104 105 105 107  CO2 '24 23 24 24 23 25 24  ' GLUCOSE 116* 170* 128* 230* 212* 105* 104*  BUN 97* 94* 100* 72* 50* 28* 20  CREATININE 5.21* 4.56* 4.47* 2.34* 1.40* 0.99 0.96  CALCIUM 8.3* 8.4* 8.3* 8.1* 8.3* 8.5* 8.8*  MG 3.2*  --   --   --  2.1  --  1.8  PHOS  --  6.1* 6.5*  --   --   --   --     Liver Function Tests: Recent Labs  Lab 08/20/19 1543 08/20/19 2056 08/21/19 0513 08/23/19 0432 08/24/19 0559  AST 233*  --  150* 47* 45*  ALT 424*  --  323* 174* 139*  ALKPHOS 160*  --  136* 123 135*  BILITOT  1.8*  --  1.8* 1.5* 1.3*  PROT 5.7*  --  5.2* 5.0* 5.4*  ALBUMIN 2.8* 2.6* 2.5*  2.6* 2.4* 2.7*   No results for input(s): LIPASE, AMYLASE in the last 168 hours. Recent Labs  Lab 08/22/19 1829  AMMONIA 18    CBC: Recent Labs  Lab 08/20/19 1543 08/21/19 0513 08/22/19 1008 08/23/19 0432 08/24/19 0559 08/25/19 0450  WBC 16.3* 12.1* 11.8* 9.6 10.9* 10.7*  NEUTROABS 12.2*  --   --  7.0  --   --   HGB 9.6* 8.9* 9.2* 8.4* 9.3* 9.5*  HCT 31.2* 28.7* 30.2* 27.8* 31.9* 31.9*  MCV 82.5 81.1 83.7 84.0 87.2 86.7  PLT 265 223 248 225 258 250    Cardiac Enzymes: No results for input(s): CKTOTAL, CKMB, CKMBINDEX, TROPONINI in the last 168 hours.  BNP: Invalid input(s): POCBNP  CBG: Recent Labs  Lab 08/23/19 2109 08/24/19 0756 08/24/19 1220 08/24/19 1621 08/24/19 2125  GLUCAP 152* 97 122* 133* 144*    Microbiology: Results for orders placed or performed during the hospital encounter of 08/20/19  Urine culture     Status: None   Collection Time: 08/20/19  5:19 PM   Specimen: In/Out Cath Urine  Result Value Ref Range Status   Specimen  Description   Final    IN/OUT CATH URINE Performed at [facility], [address] [place], [place] [address]    Special Requests   Final    NONE Performed at [facility], [address], [place], [place] [address]    Culture   Final    NO GROWTH Performed at [facility], [place] [address], [place], [place] [address]    Report Status 08/21/2019 FINAL  Final  Culture, blood (routine x 2)     Status: None (Preliminary result)   Collection Time: 08/20/19  5:21 PM   Specimen: BLOOD  Result Value Ref Range Status   Specimen Description BLOOD RIGHT ANTECUBITAL  Final   Special Requests   Final    BOTTLES DRAWN AEROBIC AND ANAEROBIC Blood Culture adequate volume   Culture   Final    NO GROWTH 3 DAYS Performed at [facility], [address], [place], [place] [address]    Report Status PENDING   Incomplete  SARS Coronavirus 2 by RT PCR (hospital order, performed in [place] hospital lab) Nasopharyngeal Nasopharyngeal Swab     Status: None   Collection Time: 08/20/19  5:47 PM   Specimen: Nasopharyngeal Swab  Result Value Ref Range Status   SARS Coronavirus 2 NEGATIVE NEGATIVE Final    Comment: (NOTE) If result is NEGATIVE SARS-CoV-2 target nucleic acids are NOT DETECTED. The SARS-CoV-2 RNA is generally detectable in upper and lower  respiratory specimens during the acute phase of infection. The lowest  concentration of SARS-CoV-2 viral copies this assay can detect is 250  copies / mL. A negative result does not preclude SARS-CoV-2 infection  and should not be used as the sole basis for treatment or other  patient management decisions.  A negative result may occur with  improper specimen collection / handling, submission of specimen other  than nasopharyngeal swab, presence of viral mutation(s) within the  areas targeted by this assay, and inadequate number of viral copies  (<250 copies / mL). A negative result must be combined with clinical  observations, patient history, and epidemiological information. If result is POSITIVE SARS-CoV-2 target nucleic acids are DETECTED. The SARS-CoV-2 RNA is generally detectable in upper and lower  respiratory specimens dur ing the acute phase of infection.  Positive  results are indicative of active infection with SARS-CoV-2.  Clinical  correlation with patient history and other diagnostic information is  necessary to determine patient infection status.  Positive results do  not rule out bacterial infection or co-infection with other viruses. If result is PRESUMPTIVE POSTIVE SARS-CoV-2 nucleic acids MAY BE PRESENT.   A presumptive positive result was obtained on the submitted specimen  and confirmed on repeat testing.  While 2019 novel coronavirus  (SARS-CoV-2) nucleic acids may be present in the submitted sample  additional  confirmatory testing may be necessary for epidemiological  and / or clinical management purposes  to differentiate between  SARS-CoV-2 and other Sarbecovirus currently known to infect humans.  If clinically indicated additional testing with an alternate test  methodology [id number]) is advised. The SARS-CoV-2 RNA is generally  detectable in upper and lower respiratory sp ecimens during the acute  phase of infection. The expected result is Negative. Fact Sheet for Patients:  StrictlyIdeas.no Fact Sheet for Healthcare Providers: BankingDealers.co.za This test is not yet approved or cleared by the [place] FDA and has been authorized for detection and/or diagnosis of SARS-CoV-2 by FDA under an Emergency Use Authorization (EUA).  This  EUA will remain in effect (meaning this test can be used) for the duration of the COVID-19 declaration under Section 564(b)(1) of the Act, 21 U.S.C. section 360bbb-3(b)(1), unless the authorization is terminated or revoked sooner. Performed at [facility], [address] [place], [place] [address]     Coagulation Studies: No results for input(s): LABPROT, INR in the last 72 hours.  Urinalysis: No results for input(s): COLORURINE, LABSPEC, PHURINE, GLUCOSEU, HGBUR, BILIRUBINUR, KETONESUR, PROTEINUR, UROBILINOGEN, NITRITE, LEUKOCYTESUR in the last 72 hours.  Invalid input(s): APPERANCEUR    Imaging: No results found.   Medications:   . diltiazem (CARDIZEM) infusion 5 mg/hr (08/24/19 2158)  . magnesium sulfate bolus IVPB     . apixaban  5 mg Oral BID  . carvedilol  3.125 mg Oral BID WC  . Chlorhexidine Gluconate Cloth  6 each Topical Daily  . cholecalciferol  2,000 Units Oral Daily  . ferrous [id number] C-folic acid  1 capsule Oral BID PC  . fluticasone  2 spray Each Nare Daily  . insulin aspart  0-9 Units Subcutaneous TID WC  . insulin detemir  14 Units Subcutaneous QHS  .  isosorbide mononitrate  30 mg Oral BID  . levothyroxine  125 mcg Oral Daily  . loratadine  10 mg Oral Daily  . pantoprazole  40 mg Oral Daily  . sodium chloride flush  3 mL Intravenous Q12H  . umeclidinium-vilanterol  1 puff Inhalation Daily   acetaminophen **OR** acetaminophen, albuterol, hydrALAZINE, hydrOXYzine, nitroGLYCERIN, ondansetron **OR** ondansetron (ZOFRAN) IV, oxyCODONE  Assessment/ Plan:  78 y.o. female with a PMHx of COPD, diabetes mellitus type 2, pulmonary hypertension, tricuspid regurgitation, congestive heart failure, recent left hip fracture, who was admitted to [facility] on 08/20/2019 for evaluation of altered mental status.   1.  Acute kidney injury/chronic kidney disease stage IIIa.  Baseline EGFR 45.    Renal function continues to improve.  BUN down to 20 with a creatinine of 0.96.  Continue to periodically monitor renal parameters.  2.  Hyponatremia.  Appears resolved now.  Serum sodium currently 139.  3.  Anemia of chronic kidney disease.  Hemoglobin up to 9.5.  No urgent indication for Epogen at this time.   LOS: 5 Ajamu Maxon 11/7/20207:41 AM

## 2019-08-25 NOTE — Progress Notes (Signed)
MD aware of patient's staples on left hip from recent hip surgery.

## 2019-08-25 NOTE — Progress Notes (Signed)
Pt refused the dressing change. Explained to the pt that the dressing change is leaking and needs to get done to prevent infection. Pt still refused. Will try again later. Will notify incoming shift. Will continue to monitor.

## 2019-08-25 NOTE — Progress Notes (Signed)
Progress Note  Patient Name: Destiny Zuniga Date of Encounter: 08/25/2019  Primary Cardiologist:   No primary care provider on file.   Subjective   Mild hip pain.  She does not feel palpitations.     Inpatient Medications    Scheduled Meds: . apixaban  5 mg Oral BID  . carvedilol  3.125 mg Oral BID WC  . Chlorhexidine Gluconate Cloth  6 each Topical Daily  . cholecalciferol  2,000 Units Oral Daily  . ferrous [id number] C-folic acid  1 capsule Oral BID PC  . fluticasone  2 spray Each Nare Daily  . insulin aspart  0-9 Units Subcutaneous TID WC  . insulin detemir  14 Units Subcutaneous QHS  . isosorbide mononitrate  30 mg Oral BID  . levothyroxine  125 mcg Oral Daily  . loratadine  10 mg Oral Daily  . pantoprazole  40 mg Oral Daily  . sodium chloride flush  3 mL Intravenous Q12H  . umeclidinium-vilanterol  1 puff Inhalation Daily   Continuous Infusions: . diltiazem (CARDIZEM) infusion 5 mg/hr (08/25/19 1142)   PRN Meds: acetaminophen **OR** acetaminophen, albuterol, hydrALAZINE, hydrOXYzine, nitroGLYCERIN, ondansetron **OR** ondansetron (ZOFRAN) IV, oxyCODONE   Vital Signs    Vitals:   08/25/19 0031 08/25/19 0530 08/25/19 0822 08/25/19 1203  BP: 130/63 (!) 131/47 138/68 (!) 130/55  Pulse: 66 70 71 63  Resp:  18 18 18   Temp: 98.2 F (36.8 C) 97.8 F (36.6 C) (!) 97.3 F (36.3 C) 97.7 F (36.5 C)  TempSrc: Oral Oral Oral Oral  SpO2: 98% 98% 97% 99%  Weight:  104.3 kg    Height:        Intake/Output Summary (Last 24 hours) at 08/25/2019 1301 Last data filed at 08/25/2019 1142 Gross per 24 hour  Intake 380.2 ml  Output 801 ml  Net -420.8 ml   Filed Weights   08/23/19 0448 08/24/19 0500 08/25/19 0530  Weight: 103.3 kg 103.6 kg 104.3 kg    Telemetry    Regular rhythm, atrial fib with controlled rate.  - Personally Reviewed  ECG    NA - Personally Reviewed  Physical Exam   GEN: No acute distress.   Neck: No  JVD Cardiac: RRR, no  murmurs, rubs, or gallops.  Respiratory: Clear  to auscultation bilaterally. GI: Soft, nontender, non-distended  MS: No  edema; No deformity. Neuro:  Nonfocal  Psych: Normal affect   Labs    Chemistry Recent Labs  Lab 08/21/19 0513  08/23/19 0432 08/24/19 0559 08/25/19 0450  NA 131*   < > 137 138 139  K 4.9   < > 4.0 4.0 4.2  CL 94*   < > 105 105 107  CO2 24   < > 23 25 24   GLUCOSE 128*   < > 212* 105* 104*  BUN 100*   < > 50* 28* 20  CREATININE 4.47*   < > 1.40* 0.99 0.96  CALCIUM 8.3*   < > 8.3* 8.5* 8.8*  PROT 5.2*  --  5.0* 5.4*  --   ALBUMIN 2.5*  2.6*  --  2.4* 2.7*  --   AST 150*  --  47* 45*  --   ALT 323*  --  174* 139*  --   ALKPHOS 136*  --  123 135*  --   BILITOT 1.8*  --  1.5* 1.3*  --   GFRNONAA 9*   < > 36* 54* 56*  GFRAA 10*   < > 41* >60 >  60  ANIONGAP 13   < > 9 8 8    < > = values in this interval not displayed.     Hematology Recent Labs  Lab 08/23/19 0432 08/24/19 0559 08/25/19 0450  WBC 9.6 10.9* 10.7*  RBC 3.31* 3.66* 3.68*  HGB 8.4* 9.3* 9.5*  HCT 27.8* 31.9* 31.9*  MCV 84.0 87.2 86.7  MCH 25.4* 25.4* 25.8*  MCHC 30.2 29.2* 29.8*  RDW 21.0* 22.4* 23.2*  PLT 225 258 250    Cardiac EnzymesNo results for input(s): TROPONINI in the last 168 hours. No results for input(s): TROPIPOC in the last 168 hours.   BNP Recent Labs  Lab 08/20/19 1543  BNP 280.0*     DDimer No results for input(s): DDIMER in the last 168 hours.   Radiology    No results found.  Cardiac Studies   ECHO:  Pending.   Patient Profile     78 y.o. female with history of coronary artery disease, prior stenting to LAD and left circumflex, diastolic heart failure, pulmonary hypertension, chronic hypoxic respiratory failure on nasal cannula oxygen 3 L, COPD, diabetes insulin-dependent, morbid obesity, recent hospitalization approximately 2 weeks ago for fall with left hip fracture surgical repair August 11, 2019 discharged on aspirin Plavix, presenting to the  hospital altered mental status August 20, 2019, noted to be in atrial fibrillation.    Assessment & Plan    ATRIAL FIB WITH RVR:   Started on Eliquis.     Rate is regular.  Will check an EKG to see if it is sinus.  Unable to tell from monitor.  Stop Dilt IV and change to PO.    CKD III:  Creat is much improved.    For questions or updates, please contact [facility] Please consult [email or web address] for contact info under Cardiology/STEMI.   Signed, August 22, 2019, MD  08/25/2019, 1:01 PM

## 2019-08-25 NOTE — Progress Notes (Signed)
PROGRESS NOTE    Lavinia Mcneely  CWU:[id number] DOB: [birth year] DOA: 08/20/2019 PCP: Kirk Ruths, MD    Brief Narrative:   Avia Merkley is an 78 y.o. female with history significant for COPD, type II DM, pulmonary hypertension, tricuspid regurgitation, CHF, recent left hip fracture was brought to the hospital because of altered mental status.  She was found to have acute kidney injury with creatinine of 5.21.  Baseline creatinine is around 1.15.  Interim History: 1. Cre is improving 5.21 -->4.47-->2.34 -->1.4-->0.99-->0.96 today. US-renal has no hydronephrosis 2. US-liver: Normal liver Doppler ultrasound.  Liver function improvement 3. Has left hip pain which is likely from recent left hip surgery (ORIF) 4. CT head is negative for acute issues 10/5. 5. Pt developed new onset A fib with RVR 11/6, card was consulted, started Eliquis, stopped ASA and plavix, will get 2d echo. On coreg and started Cardizem gtt 6. Dr. Leighton Roach saw pt 11/7. Stop Dilt IV and change to PO.    Assessment & Plan:   Principal Problem:   New onset atrial fibrillation with RVR Active Problems:   Hip fracture (HCC)   COPD (chronic obstructive pulmonary disease) (HCC)   Type II diabetes mellitus with renal manifestations (HCC)   Hyperlipidemia   Hyperkalemia   Coronary artery disease   Chronic diastolic CHF (congestive heart failure) (HCC)   Hypothyroidism   Hypertension associated with diabetes (HCC)   Elevated liver enzymes   Acute renal failure superimposed on stage 3a chronic kidney disease (HCC)   Altered mental status   New onset Afib with RVR:  Patient developed Afib with RVR.  HR up to 130s. Now is better controlled with IV cardizem. She had bradycardia in this admission. 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 CHADS2VASc is 7, Nee anticoagulants. TSH normal on 08/20/2019. Cardiology was consulted, Dr. Idolina Primer evaluated pt 11/6 --> started eliquis. Dr. Percival Spanish for cardiology  still patient, since patient's heart rate is better controlled, switched IV Cardizem to oral  -Highly appreciate card consultation, will follow up recommendations. - switched diltiazem to oral 11/7 - continue coreg - may need to see EP for tachy-brady syndrome  - Eliquis 5 mg bid  -No recent stenting, therefore we will stopped ASA and Plavix to minimize her bleeding risk, especially with her underlying anemia - Check echo. pending -Mg=1.8 -->will give 1 g of magnesium sulfate  Acute renal failure superimposed on stage 3a chronic kidney disease ([place]): improving. Cre is improving 5.21 -->4.47-->2.34 -->0.99-->0.96  today. US-renal has no hydronephrosis. Consulted nephrologist to assist with management. Per Dr. Holley Raring, no urgent indication for dialysis at the moment. - continue to hold cozarr -avoid NSAIDS.  Hyperkalemia: resovlve. K 4.2 today.   Hyponatremia: resolved.    Altered mental status/metabolic encephalopathy: improved, oriented x 3. CT-head negative. Ammonia level normal 18. -Frequent neuro check  Leukocytosis: no fever. Improving. 12.1-->11.8 -->9.6 -->10.6 --> 10.7 -->10.7. No clear evidence of infection at this time. This may be reactive. -f/u by CBC -Follow-up cultures -->so far negative bx and Ux  Chronic diastolic CHF: Lasix has been held.  2D echo on 10/05/2018 showed EF of 60 to 65%.  Patient has leg edema.  She has  mild shortness of breath, but dose not seem to have of acute CHFexacerbation. -will star low dose of oral lasix 20 mg daily  CAD status post PCI: no chest pain -Continue  Imdur  - d/c'ed ASA and plavis due to newly started Eliquis -Hold simvastatin because of elevated  liver enzymes.   - Coreg is restarted for A fib with RVR  Elevated troponins: Trop 14, 110, 85. This is probably from demand ischemia/acute kidney injury.  No complaints of chest pain or shortness of breath.  Elevated liver enzymes: Etiology unclear. Liver US is negative. Hepatitis  panel negative.  Improving.   Hepatic Function Latest Ref Rng & Units 08/24/2019 08/23/2019 08/21/2019  Total Protein 6.5 - 8.1 g/dL 5.4(L) 5.0(L) -  Albumin 3.5 - 5.0 g/dL 2.7(L) 2.4(L) 2.5(L)  AST 15 - 41 U/L 45(H) 47(H) -  ALT 0 - 44 U/L 139(H) 174(H) -  Alk Phosphatase 38 - 126 U/L 135(H) 123 -  Total Bilirubin 0.3 - 1.2 mg/dL 1.3(H) 1.5(H) -  Bilirubin, Direct 0.0 - 0.2 mg/dL - - -   Sinus bradycardia: Heart rate been stable between 50-60 beats per minutes. HR 101 -hold Coreg -->resumed coreg 11/5  Hypertension: losartan have been held.  -prn oral hydralazine orally   Type II diabetes mellitus with renal manifestations:  -Continue Levemir -SSI    COPD and chronic hypoxemic respiratory failure: stable -Continue bronchodilators and oxygen via nasal cannula  Recent left hip fracture status post ORIF on 08/11/2019: -Prn oxycodone   DVT prophylaxis: Heparin Code Status: Full code Family Communication: daughter by phone Disposition Plan: Discharge to SNF in 3 to 4 days Barriers for discharge: pt developed atrial fibrillation with RVR 11/6, requiring IV Cardizem, heart rate is better controlled today, just started oral Cardizem.  if patient can tolerate oral medication without developing RVR, patient may be able to be discharged in 1 or 2 days.   Consultants:   renal  Procedures:  US-renal  US-liver  Antimicrobials:    Subjective:   Has mild SOB, no CP, nausea, vomiting, abdominal pain or diarrhea.  No fever or chills.  Objective: Vitals:   08/25/19 0822 08/25/19 1203 08/25/19 1423 08/25/19 1534  BP: 138/68 (!) 130/55 131/60 126/67  Pulse: 71 63 64 66  Resp: _0 Temp: (!) 97.3 F (36.3 C) 97.7 F (36.5 C) 97.7 F (36.5 C) 97.7 F (36.5 C)  TempSrc: Oral Oral Oral Oral  SpO2: 97% 99% 98% 98%  Weight:      Height:        Intake/Output Summary (Last 24 hours) at 08/25/2019 1647 Last data filed at 08/25/2019 1423 Gross per 24 hour  Intake  393.06 ml  Output 801 ml  Net -407.94 ml   Filed Weights   08/23/19 0448 08/24/19 0500 08/25/19 0530  Weight: 103.3 kg 103.6 kg 104.3 kg    Examination: Physical Exam:  General: Not in acute distress HEENT: PERRL, EOMI, no scleral icterus, No JVD or bruit Cardiac: S1/S2, RRR, No murmurs, gallops or rubs Pulm: Clear to auscultation bilaterally. No rales, wheezing, rhonchi or rubs. Abd: Soft, nondistended, nontender, no rebound pain, no organomegaly, BS present Ext: 1+ leg edema bilaterally. 2+DP/PT pulse bilaterally Musculoskeletal: No joint deformities, erythema, or stiffness, ROM full Skin: No rashes.  Neuro: Intermittently confused, currently oriented X3, cranial nerves II-XII grossly intact, moves all extremities.  Psych: Patient is not psychotic, no suicidal or hemocidal ideation.    Data Reviewed: I have personally reviewed following labs and imaging studies  CBC: Recent Labs  Lab 08/20/19 1543 08/21/19 0513 08/22/19 1008 08/23/19 0432 08/24/19 0559 08/25/19 0450  WBC 16.3* 12.1* 11.8* 9.6 10.9* 10.7*  NEUTROABS 12.2*  --   --  7.0  --   --   HGB 9.6* 8.9* 9.2* 8.4*  9.3* 9.5*  HCT 31.2* 28.7* 30.2* 27.8* 31.9* 31.9*  MCV 82.5 81.1 83.7 84.0 87.2 86.7  PLT 265 223 248 225 258 643   Basic Metabolic Panel: Recent Labs  Lab 08/20/19 1543 08/20/19 2056 08/21/19 0513 08/22/19 1008 08/23/19 0432 08/24/19 0559 08/25/19 0450  NA 126* 128* 131* 139 137 138 139  K 7.0* 5.2* 4.9 4.3 4.0 4.0 4.2  CL 90* 91* 94* 104 105 105 107  CO2 _0 GLUCOSE 116* 170* 128* 230* 212* 105* 104*  BUN 97* 94* 100* 72* 50* 28* 20  CREATININE 5.21* 4.56* 4.47* 2.34* 1.40* 0.99 0.96  CALCIUM 8.3* 8.4* 8.3* 8.1* 8.3* 8.5* 8.8*  MG 3.2*  --   --   --  2.1  --  1.8  PHOS  --  6.1* 6.5*  --   --   --   --    GFR: Estimated Creatinine Clearance: 53.9 mL/min (by C-G formula based on SCr of 0.96 mg/dL). Liver Function Tests: Recent Labs  Lab 08/20/19 1543 08/20/19  2056 08/21/19 0513 08/23/19 0432 08/24/19 0559  AST 233*  --  150* 47* 45*  ALT 424*  --  323* 174* 139*  ALKPHOS 160*  --  136* 123 135*  BILITOT 1.8*  --  1.8* 1.5* 1.3*  PROT 5.7*  --  5.2* 5.0* 5.4*  ALBUMIN 2.8* 2.6* 2.5*  2.6* 2.4* 2.7*   No results for input(s): LIPASE, AMYLASE in the last 168 hours. Recent Labs  Lab 08/22/19 1829  AMMONIA 18   Coagulation Profile: No results for input(s): INR, PROTIME in the last 168 hours. Cardiac Enzymes: No results for input(s): CKTOTAL, CKMB, CKMBINDEX, TROPONINI in the last 168 hours. BNP (last 3 results) No results for input(s): PROBNP in the last 8760 hours. HbA1C: No results for input(s): HGBA1C in the last 72 hours. CBG: Recent Labs  Lab 08/24/19 1621 08/24/19 2125 08/25/19 0748 08/25/19 1203 08/25/19 1637  GLUCAP 133* 144* 78 182* 172*   Lipid Profile: No results for input(s): CHOL, HDL, LDLCALC, TRIG, CHOLHDL, LDLDIRECT in the last 72 hours. Thyroid Function Tests: No results for input(s): TSH, T4TOTAL, FREET4, T3FREE, THYROIDAB in the last 72 hours. Anemia Panel: No results for input(s): VITAMINB12, FOLATE, FERRITIN, TIBC, IRON, RETICCTPCT in the last 72 hours. Sepsis Labs: Recent Labs  Lab 08/20/19 2035  LATICACIDVEN 1.3    Recent Results (from the past 240 hour(s))  Urine culture     Status: None   Collection Time: 08/20/19  5:19 PM   Specimen: In/Out Cath Urine  Result Value Ref Range Status   Specimen Description   Final    IN/OUT CATH URINE Performed at [facility], [address], [place], [place] [address]    Special Requests   Final    NONE Performed at [facility], [address] [place], [place] [address]    Culture   Final    NO GROWTH Performed at [facility], [address], [place], [place] [address]    Report Status 08/21/2019 FINAL  Final  Culture, blood (routine x 2)     Status: None (Preliminary result)   Collection Time: 08/20/19  5:21 PM    Specimen: BLOOD  Result Value Ref Range Status   Specimen Description BLOOD RIGHT ANTECUBITAL  Final   Special Requests   Final    BOTTLES DRAWN AEROBIC AND ANAEROBIC Blood Culture adequate volume   Culture   Final  NO GROWTH 3 DAYS Performed at [facility], [address] [place], [place] [address]    Report Status PENDING  Incomplete  SARS Coronavirus 2 by RT PCR (hospital order, performed in [facility] hospital lab) Nasopharyngeal Nasopharyngeal Swab     Status: None   Collection Time: 08/20/19  5:47 PM   Specimen: Nasopharyngeal Swab  Result Value Ref Range Status   SARS Coronavirus 2 NEGATIVE NEGATIVE Final    Comment: (NOTE) If result is NEGATIVE SARS-CoV-2 target nucleic acids are NOT DETECTED. The SARS-CoV-2 RNA is generally detectable in upper and lower  respiratory specimens during the acute phase of infection. The lowest  concentration of SARS-CoV-2 viral copies this assay can detect is 250  copies / mL. A negative result does not preclude SARS-CoV-2 infection  and should not be used as the sole basis for treatment or other  patient management decisions.  A negative result may occur with  improper specimen collection / handling, submission of specimen other  than nasopharyngeal swab, presence of viral mutation(s) within the  areas targeted by this assay, and inadequate number of viral copies  (<250 copies / mL). A negative result must be combined with clinical  observations, patient history, and epidemiological information. If result is POSITIVE SARS-CoV-2 target nucleic acids are DETECTED. The SARS-CoV-2 RNA is generally detectable in upper and lower  respiratory specimens dur ing the acute phase of infection.  Positive  results are indicative of active infection with SARS-CoV-2.  Clinical  correlation with patient history and other diagnostic information is  necessary to determine patient infection status.  Positive results do  not rule out bacterial  infection or co-infection with other viruses. If result is PRESUMPTIVE POSTIVE SARS-CoV-2 nucleic acids MAY BE PRESENT.   A presumptive positive result was obtained on the submitted specimen  and confirmed on repeat testing.  While 2019 novel coronavirus  (SARS-CoV-2) nucleic acids may be present in the submitted sample  additional confirmatory testing may be necessary for epidemiological  and / or clinical management purposes  to differentiate between  SARS-CoV-2 and other Sarbecovirus currently known to infect humans.  If clinically indicated additional testing with an alternate test  methodology [phone]) is advised. The SARS-CoV-2 RNA is generally  detectable in upper and lower respiratory sp ecimens during the acute  phase of infection. The expected result is Negative. Fact Sheet for Patients:  StrictlyIdeas.no Fact Sheet for Healthcare Providers: BankingDealers.co.za This test is not yet approved or cleared by the [place] FDA and has been authorized for detection and/or diagnosis of SARS-CoV-2 by FDA under an Emergency Use Authorization (EUA).  This EUA will remain in effect (meaning this test can be used) for the duration of the COVID-19 declaration under Section 564(b)(1) of the Act, 21 U.S.C. section 360bbb-3(b)(1), unless the authorization is terminated or revoked sooner. Performed at [facility], [address] [place], [place] [address]       Radiology Studies: No results found.      Scheduled Meds: . apixaban  5 mg Oral BID  . carvedilol  3.125 mg Oral BID WC  . Chlorhexidine Gluconate Cloth  6 each Topical Daily  . cholecalciferol  2,000 Units Oral Daily  . diltiazem  30 mg Oral Q8H  . ferrous [id number] C-folic acid  1 capsule Oral BID PC  . fluticasone  2 spray Each Nare Daily  . furosemide  20 mg Oral Daily  . insulin aspart  0-9 Units Subcutaneous TID WC  . insulin  detemir  14  Units Subcutaneous QHS  . isosorbide mononitrate  30 mg Oral BID  . levothyroxine  125 mcg Oral Daily  . loratadine  10 mg Oral Daily  . pantoprazole  40 mg Oral Daily  . sodium chloride flush  3 mL Intravenous Q12H  . umeclidinium-vilanterol  1 puff Inhalation Daily   Continuous Infusions:    LOS: 5 days    Time spent: 30 min    Ivor Costa, DO [facility] PAGER is on AMION  If 7PM-7AM, please contact night-coverage [email or web address] Password [facility] 08/25/2019, 4:47 PM

## 2019-08-25 NOTE — Progress Notes (Addendum)
Patient continues to be on the cardizem gtt at 5 ml/hr. Patient has a stable heart rate in the 80s. No chest pain, only complaints have been pain on the left hip from recent surgery and anxiety. PRN meds given. Per patient they have helped her. Yolanda Bonine has gone home to shower but will be back.  Update 1509: Patient off of the cardizem gtt, heart rate stable. PO cardizem given as well as PRN pain medication. Handoff given.

## 2019-08-25 NOTE — Progress Notes (Signed)
Physical Therapy Treatment Patient Details Name: Ronelle Michie MRN: [id number] DOB: 09-Jul-1941 Today's Date: 08/25/2019    History of Present Illness Patient is a 78 year old female admitted from SNF with AMS, acute renal failure. S/P hip fx and ORIF 08/11/19. PMH to include: COPD, Home O2, DM, HLD, CAD, CHF, HTN.    PT Comments    Initial attempt, pt had just received pain medication.  Assisted with repositioning due to pain with max a x 2.  Returned 30 minutes later.  She is highly motivated despite pain to increase her mobility in order to get to rehab and return home.  Max a x 1 to edge of bed with increased time and breaks due to pain.  Once sitting, she does c/o dizziness but relieves with time.  She is able to sit x 15 minutes with min guard but standing is deferred for safety at this time.  She returns to supine due to fatigue with max a and repositioned for comfort.  MD in and discussed.  Staples remain in at this time from hip sx.  RN also aware.   Follow Up Recommendations  SNF     Equipment Recommendations  None recommended by PT    Recommendations for Other Services       Precautions / Restrictions Precautions Precautions: Fall Restrictions Weight Bearing Restrictions: Yes RLE Weight Bearing: Weight bearing as tolerated LLE Weight Bearing: Partial weight bearing LLE Partial Weight Bearing Percentage or Pounds: 50 Other Position/Activity Restrictions: Per PA-C Horris Latino via phone call 08/24/19 pt to be 50% PWB on the LLE until 08/31/19 at which time she may be WBAT    Mobility  Bed Mobility Overal bed mobility: Needs Assistance Bed Mobility: Rolling Rolling: Max assist   Supine to sit: Max assist Sit to supine: Max assist   General bed mobility comments: slow and painful  Transfers                 General transfer comment: deferred attempts at standing.  sat EOB 15 minutes with supervision.  Ambulation/Gait                  Stairs             Wheelchair Mobility    Modified Rankin (Stroke Patients Only)       Balance Overall balance assessment: Needs assistance Sitting-balance support: Single extremity supported;Feet supported Sitting balance-Leahy Scale: Fair Sitting balance - Comments: R lean away from surgical site.       Standing balance comment: deferred                            Cognition Arousal/Alertness: Awake/alert Behavior During Therapy: WFL for tasks assessed/performed Overall Cognitive Status: Within Functional Limits for tasks assessed                                        Exercises Other Exercises Other Exercises: sat EOB x 15 minutes    General Comments        Pertinent Vitals/Pain Pain Assessment: Faces Faces Pain Scale: Hurts whole lot Pain Location: L hip/LE Pain Descriptors / Indicators: Sore;Discomfort;Grimacing;Guarding Pain Intervention(s): Limited activity within patient's tolerance;Monitored during session;Premedicated before session    Home Living  Prior Function            PT Goals (current goals can now be found in the care plan section) Progress towards PT goals: Progressing toward goals    Frequency    7X/week      PT Plan Current plan remains appropriate    Co-evaluation              AM-PAC PT "6 Clicks" Mobility   Outcome Measure  Help needed turning from your back to your side while in a flat bed without using bedrails?: A Lot Help needed moving from lying on your back to sitting on the side of a flat bed without using bedrails?: A Lot Help needed moving to and from a bed to a chair (including a wheelchair)?: Total Help needed standing up from a chair using your arms (e.g., wheelchair or bedside chair)?: Total Help needed to walk in hospital room?: Total Help needed climbing 3-5 steps with a railing? : Total 6 Click Score: 8    End of Session Equipment  Utilized During Treatment: Oxygen Activity Tolerance: Patient limited by pain Patient left: in bed;with bed alarm set;with call bell/phone within reach   Pain - Right/Left: Left Pain - part of body: Hip     Time: 3557-3220 PT Time Calculation (min) (ACUTE ONLY): 36 min  Charges:  $Therapeutic Activity: 23-37 mins                    Chesley Noon, PTA 08/25/19, 10:22 AM

## 2019-08-25 NOTE — Progress Notes (Addendum)
Pt was on caridzem gtt and CCMD was called and request Q 4 strip for pt.  Update 0600: CCMD called and reported forgot the 0000 and 0300 q strips but doing them now. Will notify prime. Will notify incoming shift. Will continue to monitor.

## 2019-08-25 NOTE — Plan of Care (Signed)
  Problem: Education: Goal: Knowledge of General Education information will improve Description: Including pain rating scale, medication(s)/side effects and non-pharmacologic comfort measures Outcome: Progressing   Problem: Pain Managment: Goal: General experience of comfort will improve Outcome: Progressing   Problem: Safety: Goal: Ability to remain free from injury will improve Outcome: Progressing   

## 2019-08-26 ENCOUNTER — Inpatient Hospital Stay (HOSPITAL_COMMUNITY)
Admit: 2019-08-26 | Discharge: 2019-08-26 | Disposition: A | Discharge status: Home / Self Care | Payer: Medicare Other | Attending: Physician Assistant | Admitting: Physician Assistant

## 2019-08-26 DIAGNOSIS — I340 Nonrheumatic mitral (valve) insufficiency: Secondary | ICD-10-CM

## 2019-08-26 DIAGNOSIS — I361 Nonrheumatic tricuspid (valve) insufficiency: Secondary | ICD-10-CM

## 2019-08-26 LAB — MAGNESIUM: Magnesium: 2.1 mg/dL (ref 1.7–2.4)

## 2019-08-26 LAB — CBC
HCT: 31.6 % — ABNORMAL LOW (ref 36.0–46.0)
Hemoglobin: 9.6 g/dL — ABNORMAL LOW (ref 12.0–15.0)
MCH: 26.1 pg (ref 26.0–34.0)
MCHC: 30.4 g/dL (ref 30.0–36.0)
MCV: 85.9 fL (ref 80.0–100.0)
Platelets: 245 10*3/uL (ref 150–400)
RBC: 3.68 MIL/uL — ABNORMAL LOW (ref 3.87–5.11)
RDW: 24.1 % — ABNORMAL HIGH (ref 11.5–15.5)
WBC: 10.7 10*3/uL — ABNORMAL HIGH (ref 4.0–10.5)
nRBC: 0 % (ref 0.0–0.2)

## 2019-08-26 LAB — COMPREHENSIVE METABOLIC PANEL
ALT: 83 U/L — ABNORMAL HIGH (ref 0–44)
AST: 30 U/L (ref 15–41)
Albumin: 2.6 g/dL — ABNORMAL LOW (ref 3.5–5.0)
Alkaline Phosphatase: 125 U/L (ref 38–126)
Anion gap: 8 (ref 5–15)
BUN: 16 mg/dL (ref 8–23)
CO2: 23 mmol/L (ref 22–32)
Calcium: 8.4 mg/dL — ABNORMAL LOW (ref 8.9–10.3)
Chloride: 105 mmol/L (ref 98–111)
Creatinine, Ser: 0.9 mg/dL (ref 0.44–1.00)
GFR calc Af Amer: >60 mL/min (ref >60)
GFR calc non Af Amer: >60 mL/min (ref >60)
Glucose, Bld: 150 mg/dL — ABNORMAL HIGH (ref 70–99)
Potassium: 4 mmol/L (ref 3.5–5.1)
Sodium: 136 mmol/L (ref 135–145)
Total Bilirubin: 1.3 mg/dL — ABNORMAL HIGH (ref 0.3–1.2)
Total Protein: 5.2 g/dL — ABNORMAL LOW (ref 6.5–8.1)

## 2019-08-26 LAB — GLUCOSE, CAPILLARY
Glucose-Capillary: 142 mg/dL — ABNORMAL HIGH (ref 70–99)
Glucose-Capillary: 189 mg/dL — ABNORMAL HIGH (ref 70–99)
Glucose-Capillary: 156 mg/dL — ABNORMAL HIGH (ref 70–99)
Glucose-Capillary: 188 mg/dL — ABNORMAL HIGH (ref 70–99)

## 2019-08-26 LAB — ECHOCARDIOGRAM COMPLETE
Height: 62 in
Weight: 3620.8 oz

## 2019-08-26 MED ORDER — POLYETHYLENE GLYCOL 3350 17 G PO PACK
17.0000 g | PACK | Freq: Every day | ORAL | Status: DC | PRN
  Administered 2019-08-26: 17 g via ORAL
  Filled 2019-08-26: qty 1

## 2019-08-26 MED ORDER — SENNA 8.6 MG PO TABS
1.0000 | ORAL_TABLET | Freq: Every day | ORAL | Status: DC
  Administered 2019-08-26 – 2019-08-29 (×4): 8.6 mg via ORAL
  Filled 2019-08-26 (×4): qty 1

## 2019-08-26 MED ORDER — DILTIAZEM HCL ER COATED BEADS 120 MG PO CP24
120.0000 mg | ORAL_CAPSULE | Freq: Every day | ORAL | Status: DC
  Administered 2019-08-26 – 2019-08-29 (×4): 120 mg via ORAL
  Filled 2019-08-26 (×4): qty 1

## 2019-08-26 NOTE — Progress Notes (Signed)
Progress Note  Patient Name: Destiny Zuniga Date of Encounter: 08/26/2019  Primary Cardiologist:   No primary care provider on file.   Subjective   No chest pain.  No SOB.  No palpitations.   Inpatient Medications    Scheduled Meds: . apixaban  5 mg Oral BID  . carvedilol  3.125 mg Oral BID WC  . Chlorhexidine Gluconate Cloth  6 each Topical Daily  . cholecalciferol  2,000 Units Oral Daily  . diltiazem  30 mg Oral Q8H  . ferrous [id number] C-folic acid  1 capsule Oral BID PC  . fluticasone  2 spray Each Nare Daily  . furosemide  20 mg Oral Daily  . insulin aspart  0-9 Units Subcutaneous TID WC  . insulin detemir  14 Units Subcutaneous QHS  . isosorbide mononitrate  30 mg Oral BID  . levothyroxine  125 mcg Oral Daily  . loratadine  10 mg Oral Daily  . pantoprazole  40 mg Oral Daily  . sodium chloride flush  3 mL Intravenous Q12H  . umeclidinium-vilanterol  1 puff Inhalation Daily   Continuous Infusions:  PRN Meds: acetaminophen **OR** acetaminophen, albuterol, hydrALAZINE, hydrOXYzine, nitroGLYCERIN, ondansetron **OR** ondansetron (ZOFRAN) IV, oxyCODONE, polyvinyl alcohol   Vital Signs    Vitals:   08/25/19 1916 08/26/19 0218 08/26/19 0610 08/26/19 0728  BP: (!) 144/66 135/66 140/62 (!) 148/69  Pulse: 85 (!) 57 68 (!) 57  Resp: 16  16 18   Temp: 98.1 F (36.7 C)  98 F (36.7 C) 97.9 F (36.6 C)  TempSrc: Oral  Oral Oral  SpO2: 98%  98% 98%  Weight:   102.6 kg   Height:        Intake/Output Summary (Last 24 hours) at 08/26/2019 0820 Last data filed at 08/26/2019 0615 Gross per 24 hour  Intake 332.4 ml  Output 900 ml  Net -567.6 ml   Filed Weights   08/24/19 0500 08/25/19 0530 08/26/19 0610  Weight: 103.6 kg 104.3 kg 102.6 kg    Telemetry    Atrial fib with slow ventricular rate  - Personally Reviewed  ECG    Atrial fib, rate 69, axis WNL, intervals WNL, non specific ST T wave flattening.  - Personally Reviewed  Physical Exam   GEN: No  acute distress.   Neck: No  JVD Cardiac: Irregular RR, no murmurs, rubs, or gallops.  Respiratory: Clear   to auscultation bilaterally. GI: Soft, nontender, non-distended, normal bowel sounds  MS:  Mild left greater than right leg edema; No deformity. Neuro:   Nonfocal  Psych: Oriented and appropriate    Labs    Chemistry Recent Labs  Lab 08/23/19 0432 08/24/19 0559 08/25/19 0450 08/26/19 0535  NA 137 138 139 136  K 4.0 4.0 4.2 4.0  CL 105 105 107 105  CO2 23 25 24 23   GLUCOSE 212* 105* 104* 150*  BUN 50* 28* 20 16  CREATININE 1.40* 0.99 0.96 0.90  CALCIUM 8.3* 8.5* 8.8* 8.4*  PROT 5.0* 5.4*  --  5.2*  ALBUMIN 2.4* 2.7*  --  2.6*  AST 47* 45*  --  30  ALT 174* 139*  --  83*  ALKPHOS 123 135*  --  125  BILITOT 1.5* 1.3*  --  1.3*  GFRNONAA 36* 54* 56* >60  GFRAA 41* >60 >60 >60  ANIONGAP 9 8 8 8      Hematology Recent Labs  Lab 08/24/19 0559 08/25/19 0450 08/26/19 0535  WBC 10.9* 10.7* 10.7*  RBC 3.66*  3.68* 3.68*  HGB 9.3* 9.5* 9.6*  HCT 31.9* 31.9* 31.6*  MCV 87.2 86.7 85.9  MCH 25.4* 25.8* 26.1  MCHC 29.2* 29.8* 30.4  RDW 22.4* 23.2* 24.1*  PLT 258 250 245    Cardiac EnzymesNo results for input(s): TROPONINI in the last 168 hours. No results for input(s): TROPIPOC in the last 168 hours.   BNP Recent Labs  Lab 08/20/19 1543  BNP 280.0*     DDimer No results for input(s): DDIMER in the last 168 hours.   Radiology    No results found.  Cardiac Studies   ECHO:   1. Global right ventricle has moderately reduced systolic function.The right ventricular size is mildly enlarged. No increase in right ventricular wall thickness.  2. Right atrial size was moderately dilated.  3. Severely elevated pulmonary artery systolic pressure.  4. Left ventricular ejection fraction, by visual estimation, is 60 to 65%. The left ventricle has normal function. There is no left ventricular hypertrophy.  5. Left ventricular diastolic parameters are  indeterminate.  6. Left atrial size was normal.  7. Tricuspid valve regurgitation moderate-severe.  8. The tricuspid regurgitant velocity is 4.09 m/s, and with an assumed right atrial pressure of 10 mmHg, the estimated right ventricular systolic pressure is severely elevated at 76.9 mmHg.  Patient Profile     78 y.o. female with history of coronary artery disease, prior stenting to LAD and left circumflex, diastolic heart failure, pulmonary hypertension, chronic hypoxic respiratory failure on nasal cannula oxygen 3 L, COPD, diabetes insulin-dependent, morbid obesity, recent hospitalization approximately 2 weeks ago for fall with left hip fracture surgical repair August 11, 2019 discharged on aspirin Plavix, presenting to the hospital altered mental status August 20, 2019, noted to be in atrial fibrillation.    Assessment & Plan    ATRIAL FIB WITH RVR:   Started on Eliquis.    Still in atrial fib but rate slow.  OK to stop the Coreg.  I will continue Cardizem and change to CD.   She is on Eliquis.  TR:  Pulmonary HTN noted.   Mild RV dysfunction.  Needs follow up and possible work up in the future.    Continue current therapy.    For questions or updates, please contact [facility] Please consult [email or web address] for contact info under Cardiology/STEMI.   Signed, Rollene Rotunda, MD  08/26/2019, 8:20 AM

## 2019-08-26 NOTE — Discharge Instructions (Signed)
You were cared for by a hospitalist during your hospital stay. If you have any questions about your discharge medications or the care you received while you were in the hospital after you are discharged, you can call the unit and ask to speak with the hospitalist on call if the hospitalist that took care of you is not available. Once you are discharged, your primary care physician will handle any further medical issues. Please note that NO REFILLS for any discharge medications will be authorized once you are discharged, as it is imperative that you return to your primary care physician (or establish a relationship with a primary care physician if you do not have one) for your aftercare needs so that they can reassess your need for medications and monitor your lab values.  Follow up with PCP. Take all medications as prescribed. If symptoms change or worsen please return to the ED for evaluation    Orthopedics The left leg has multiple wounds.  The staples will be removed before discharge involving the distal incision above the knee.  Steri-Strips can remain for several days.  The proximal wound has multiple blistering around it that will need daily dressing changes.  Keep the wound clean and dry.  The staples can be reevaluated daily and potentially removed in the next 3 to 5 days.  Reinforced with Steri-Strips when the staples are removed.  Make sure the wound is dry with no significant serous drainage.  Follow-up at [facility] clinic in 2 weeks.  Dr. Rudene Christians

## 2019-08-26 NOTE — Progress Notes (Signed)
Physical Therapy Treatment Patient Details Name: Destiny Zuniga MRN: [id number] DOB: April 30, 1941 Today's Date: 08/26/2019    History of Present Illness Patient is a 78 year old female admitted from SNF with AMS, acute renal failure. S/P hip fx and ORIF 08/11/19. PMH to include: COPD, Home O2, DM, HLD, CAD, CHF, HTN.    PT Comments    Pt was seen to work on bed mob, then standing and attempted steps.  Pt is quite afraid of falling again, and talked about this with her as a reason to take one step at a time with sitting then standing and then stepping if ready.  Her plan is still SNF, motivated to go home if she is able to demonstrate the functional ability to get there.  Realistically is still a rehab candidate, but her motivation will help her recover faster.  Follow Up Recommendations  SNF     Equipment Recommendations  None recommended by PT    Recommendations for Other Services       Precautions / Restrictions Precautions Precautions: Fall Restrictions Weight Bearing Restrictions: Yes RLE Weight Bearing: Weight bearing as tolerated LLE Weight Bearing: Partial weight bearing LLE Partial Weight Bearing Percentage or Pounds: 50 Other Position/Activity Restrictions: Per PA-C Horris Latino via phone call 08/24/19 pt to be 50% PWB on the LLE until 08/31/19 at which time she may be WBAT    Mobility  Bed Mobility Overal bed mobility: Needs Assistance Bed Mobility: Supine to Sit;Sit to Supine Rolling: Max assist   Supine to sit: Mod assist Sit to supine: Max assist;+2 for physical assistance;+2 for safety/equipment   General bed mobility comments: pt was in less pain today  Transfers Overall transfer level: Needs assistance Equipment used: Rolling walker (2 wheeled);1 person hand held assist Transfers: Sit to/from Stand Sit to Stand: Mod assist         General transfer comment: could not stand as well on walker as when PT directly assisted her  Ambulation/Gait                  Stairs             Wheelchair Mobility    Modified Rankin (Stroke Patients Only)       Balance Overall balance assessment: Needs assistance Sitting-balance support: Feet supported;Single extremity supported Sitting balance-Leahy Scale: Fair     Standing balance support: Bilateral upper extremity supported;During functional activity Standing balance-Leahy Scale: Poor                              Cognition Arousal/Alertness: Awake/alert Behavior During Therapy: WFL for tasks assessed/performed Overall Cognitive Status: Within Functional Limits for tasks assessed                                 General Comments: talking about plan to go home with her significant other      Exercises      General Comments General comments (skin integrity, edema, etc.): pt is up to side of bed with effort but gave PT some assist to get there       Pertinent Vitals/Pain Pain Assessment: Faces Faces Pain Scale: Hurts even more Pain Location: L hip/LE Pain Descriptors / Indicators: Operative site guarding;Guarding Pain Intervention(s): Limited activity within patient's tolerance;Monitored during session;Premedicated before session;Repositioned    Home Living  Prior Function            PT Goals (current goals can now be found in the care plan section) Acute Rehab PT Goals Patient Stated Goal: to go back home after rehab Progress towards PT goals: Progressing toward goals    Frequency    7X/week      PT Plan Current plan remains appropriate    Co-evaluation              AM-PAC PT "6 Clicks" Mobility   Outcome Measure  Help needed turning from your back to your side while in a flat bed without using bedrails?: A Lot Help needed moving from lying on your back to sitting on the side of a flat bed without using bedrails?: A Lot Help needed moving to and from a bed to a chair (including a  wheelchair)?: A Lot Help needed standing up from a chair using your arms (e.g., wheelchair or bedside chair)?: A Lot Help needed to walk in hospital room?: Total Help needed climbing 3-5 steps with a railing? : Total 6 Click Score: 10    End of Session Equipment Utilized During Treatment: Gait belt;Oxygen Activity Tolerance: Patient tolerated treatment well;Patient limited by fatigue;Patient limited by pain Patient left: in bed;with call bell/phone within reach;with bed alarm set Nurse Communication: Mobility status PT Visit Diagnosis: Unsteadiness on feet (R26.81);Muscle weakness (generalized) (M62.81);Difficulty in walking, not elsewhere classified (R26.2);History of falling (Z91.81);Pain;Other abnormalities of gait and mobility (R26.89) Pain - Right/Left: Left Pain - part of body: Hip     Time: 7017-7939 PT Time Calculation (min) (ACUTE ONLY): 35 min  Charges:  $Therapeutic Activity: 23-37 mins                    Ramond Dial 08/26/2019, 6:04 PM   Mee Hives, PT MS Acute Rehab Dept. Number: [place] [facility] [place]

## 2019-08-26 NOTE — Progress Notes (Signed)
PROGRESS NOTE    Destiny Rohe  LPN:[id number] DOB: 1941-02-03 DOA: 08/20/2019 PCP: Kirk Ruths, MD    Brief Narrative:   Destiny Zuniga is an 78 y.o. female with history significant for COPD, type II DM, pulmonary hypertension, tricuspid regurgitation, CHF, recent left hip fracture was brought to the hospital because of altered mental status.  She was found to have acute kidney injury with creatinine of 5.21.  Baseline creatinine is around 1.15.  Interim History: 1. Cre is improving 5.21 -->4.47-->2.34 -->1.4-->0.99-->0.96 today. US-renal has no hydronephrosis 2. US-liver: Normal liver Doppler ultrasound.  Liver function improvement 3. Has left hip pain which is likely from recent left hip surgery (ORIF) 4. CT head is negative for acute issues 10/5. 5. 11/6: Pt developed new onset A fib with RVR 11/6, card was consulted, started Eliquis, stopped ASA and plavix, will get 2d echo. 6. Dr. Leighton Roach saw pt 11/7. Stop Dilt IV and change to PO.  7. 11/8: HR 60-70s, per Dr. Percival Spanish, [place] to stop the Coreg, continue Cardizem and change to CD.   8. 11/8, 2D echo showed:   1. Global right ventricle has moderately reduced systolic function.The right ventricular size is mildly enlarged. No increase in right ventricular wall thickness.   2. Right atrial size was moderately dilated.   3. Severely elevated pulmonary artery systolic pressure.   4. Left ventricular ejection fraction, by visual estimation, is 60 to 65%. The left ventricle has normal function. There is no left ventricular hypertrophy.   5. Left ventricular diastolic parameters are indeterminate.   6. Left atrial size was normal.   7. Tricuspid valve regurgitation moderate-severe.   8. The tricuspid regurgitant velocity is 4.09 m/s, and with an assumed right atrial pressure of 10 mmHg, the estimated right ventricular systolic pressure is severely elevated at 76.9 mmHg.   Assessment & Plan:    Principal Problem:   New onset atrial fibrillation with RVR Active Problems:   Hip fracture (HCC)   COPD (chronic obstructive pulmonary disease) (HCC)   Type II diabetes mellitus with renal manifestations (HCC)   Hyperlipidemia   Hyperkalemia   Coronary artery disease   Chronic diastolic CHF (congestive heart failure) (HCC)   Hypothyroidism   Hypertension associated with diabetes (HCC)   Elevated liver enzymes   Acute renal failure superimposed on stage 3a chronic kidney disease (HCC)   Altered mental status   New onset Afib with RVR:  Patient developed Afib with RVR.  HR up to 130s. Now is better controlled with IV cardizem. She had bradycardia in this admission. 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 CHADS2VASc is 7, Nee anticoagulants. TSH normal on 08/20/2019. Cardiology was consulted, Dr. Idolina Primer evaluated pt 11/6 --> started eliquis. Dr. Percival Spanish for cardiology still patient, since patient's heart rate is better controlled, switched IV Cardizem to oral med.  11/8: HR 60-70s, per Dr. Percival Spanish, [place] to stop the Coreg, continue Cardizem and change to CD.  2d echo on 11/8 showed EF 60 to 65%, TR, Pulmonary HTN, mild RV dysfunction. Per Dr. Percival Spanish, will needs follow up and possible work up in the future.    -Highly appreciate card consultation, will follow up recommendations. - switched to diltiazem CD 120 mg daily - d/c coreg - may need to see EP for tachy-brady syndrome  - Eliquis 5 mg bid  -No recent stenting, therefore we will stopped ASA and Plavix to minimize her bleeding risk, especially with her underlying anemia - Check echo. pending  Acute renal failure superimposed on stage 3a chronic kidney disease ([place]): improving. Cre is improving 5.21 -->4.47-->2.34 -->0.99-->0.96  today. US-renal has no hydronephrosis. Consulted nephrologist to assist with management. Per Dr. Holley Raring, no urgent indication for dialysis at the moment. - continue to hold cozarr -avoid NSAIDS.  Hyperkalemia:  resovlve. K 4.2 today.   Hyponatremia: resolved.    Altered mental status/metabolic encephalopathy: improved, oriented x 3. CT-head negative. Ammonia level normal 18. -Frequent neuro check  Leukocytosis: no fever. Improving. 12.1-->11.8 -->9.6 -->10.6 --> 10.7 -->10.7 -->10.7. No clear evidence of infection at this time. This may be reactive. -f/u by CBC -Follow-up cultures -->so far negative bx and Ux  Chronic diastolic CHF: Lasix has been held.  2D echo on 10/05/2018 showed EF of 60 to 65%.  Patient has leg edema.  She has  mild shortness of breath, but dose not seem to have of acute CHFexacerbation. -low dose of oral lasix 20 mg daily  CAD status post PCI: no chest pain -Continue  Imdur  - d/c'ed ASA and plavis due to newly started Eliquis -Hold simvastatin because of elevated liver enzymes.   - Coreg is restarted for A fib with RVR  Elevated troponins: Trop 14, 110, 85. This is probably from demand ischemia/acute kidney injury.  No complaints of chest pain or shortness of breath.  Elevated liver enzymes: Etiology unclear. Liver US is negative. Hepatitis panel negative.  Improving.   Hepatic Function Latest Ref Rng & Units 08/26/2019 08/24/2019 08/23/2019  Total Protein 6.5 - 8.1 g/dL 5.2(L) 5.4(L) 5.0(L)  Albumin 3.5 - 5.0 g/dL 2.6(L) 2.7(L) 2.4(L)  AST 15 - 41 U/L 30 45(H) 47(H)  ALT 0 - 44 U/L 83(H) 139(H) 174(H)  Alk Phosphatase 38 - 126 U/L 125 135(H) 123  Total Bilirubin 0.3 - 1.2 mg/dL 1.3(H) 1.3(H) 1.5(H)  Bilirubin, Direct 0.0 - 0.2 mg/dL - - -    Hypertension: losartan have been held.  -prn oral hydralazine orally -on Cardizme and lasix    Type II diabetes mellitus with renal manifestations:  -Continue Levemir -SSI    COPD and chronic hypoxemic respiratory failure: stable -Continue bronchodilators and oxygen via nasal cannula  Recent left hip fracture status post ORIF on 08/11/2019: -Prn oxycodone -consulted ortho, Dr. Roland Rack, for removing staples,  "I  have reviewed the patient's history and recent lab work, and have evaluated the patient as well.  I agree with the assessment of Reche Dixon, PA-C, regarding her wounds.  The staples in her distal incision can be removed today, but the other staples should remain in place for a few more days.  She certainly may be discharged today if she is cleared from a medical standpoint, and return to the office toward the end of the week for staple removal". -highly appreciate Dr. Nicholaus Bloom Consultation.    DVT prophylaxis: on Eliquis Code Status: Full code Family Communication: grandson at bedside Disposition Plan: Discharge to SNF in 1 to 2 days, pending bed availability Barriers for discharge:   Consultants:   renal  Procedures:  US-renal  US-liver  Antimicrobials:    Subjective:   Has mild SOB, no CP, nausea, vomiting, abdominal pain or diarrhea.  No fever or chills.  Objective: Vitals:   08/25/19 1916 08/26/19 0218 08/26/19 0610 08/26/19 0728  BP: (!) 144/66 135/66 140/62 (!) 148/69  Pulse: 85 (!) 57 68 (!) 57  Resp: _0 Temp: 98.1 F (36.7 C)  98 F (36.7 C) 97.9 F (36.6 C)  TempSrc: Oral  Oral Oral  SpO2: 98%  98% 98%  Weight:   102.6 kg   Height:        Intake/Output Summary (Last 24 hours) at 08/26/2019 1447 Last data filed at 08/26/2019 1300 Gross per 24 hour  Intake 483 ml  Output 900 ml  Net -417 ml   Filed Weights   08/24/19 0500 08/25/19 0530 08/26/19 0610  Weight: 103.6 kg 104.3 kg 102.6 kg    Examination: Physical Exam:  General: Not in acute distress HEENT: PERRL, EOMI, no scleral icterus, No JVD or bruit Cardiac: S1/S2, RRR, No murmurs, gallops or rubs Pulm: Clear to auscultation bilaterally. No rales, wheezing, rhonchi or rubs. Abd: Soft, nondistended, nontender, no rebound pain, no organomegaly, BS present Ext: 1+ leg edema bilaterally. 2+DP/PT pulse bilaterally Musculoskeletal: No joint deformities, erythema, or stiffness, ROM full Skin:  No rashes.  Neuro: Intermittently confused, currently oriented X3, cranial nerves II-XII grossly intact, moves all extremities.  Psych: Patient is not psychotic, no suicidal or hemocidal ideation.    Data Reviewed: I have personally reviewed following labs and imaging studies  CBC: Recent Labs  Lab 08/20/19 1543  08/22/19 1008 08/23/19 0432 08/24/19 0559 08/25/19 0450 08/26/19 0535  WBC 16.3*   < > 11.8* 9.6 10.9* 10.7* 10.7*  NEUTROABS 12.2*  --   --  7.0  --   --   --   HGB 9.6*   < > 9.2* 8.4* 9.3* 9.5* 9.6*  HCT 31.2*   < > 30.2* 27.8* 31.9* 31.9* 31.6*  MCV 82.5   < > 83.7 84.0 87.2 86.7 85.9  PLT 265   < > 248 225 258 250 245   < > = values in this interval not displayed.   Basic Metabolic Panel: Recent Labs  Lab 08/20/19 1543 08/20/19 2056 08/21/19 0513 08/22/19 1008 08/23/19 0432 08/24/19 0559 08/25/19 0450 08/26/19 0535  NA 126* 128* 131* 139 137 138 139 136  K 7.0* 5.2* 4.9 4.3 4.0 4.0 4.2 4.0  CL 90* 91* 94* 104 105 105 107 105  CO2 _0 GLUCOSE 116* 170* 128* 230* 212* 105* 104* 150*  BUN 97* 94* 100* 72* 50* 28* 20 16  CREATININE 5.21* 4.56* 4.47* 2.34* 1.40* 0.99 0.96 0.90  CALCIUM 8.3* 8.4* 8.3* 8.1* 8.3* 8.5* 8.8* 8.4*  MG 3.2*  --   --   --  2.1  --  1.8 2.1  PHOS  --  6.1* 6.5*  --   --   --   --   --    GFR: Estimated Creatinine Clearance: 56.9 mL/min (by C-G formula based on SCr of 0.9 mg/dL). Liver Function Tests: Recent Labs  Lab 08/20/19 1543 08/20/19 2056 08/21/19 0513 08/23/19 0432 08/24/19 0559 08/26/19 0535  AST 233*  --  150* 47* 45* 30  ALT 424*  --  323* 174* 139* 83*  ALKPHOS 160*  --  136* 123 135* 125  BILITOT 1.8*  --  1.8* 1.5* 1.3* 1.3*  PROT 5.7*  --  5.2* 5.0* 5.4* 5.2*  ALBUMIN 2.8* 2.6* 2.5*  2.6* 2.4* 2.7* 2.6*   No results for input(s): LIPASE, AMYLASE in the last 168 hours. Recent Labs  Lab 08/22/19 1829  AMMONIA 18   Coagulation Profile: No results for input(s): INR, PROTIME in  the last 168 hours. Cardiac Enzymes: No results for input(s): CKTOTAL, CKMB, CKMBINDEX, TROPONINI in the last 168 hours. BNP (last 3 results) No results for input(s): PROBNP in  the last 8760 hours. HbA1C: No results for input(s): HGBA1C in the last 72 hours. CBG: Recent Labs  Lab 08/25/19 1203 08/25/19 1637 08/25/19 2121 08/26/19 0727 08/26/19 1139  GLUCAP 182* 172* 204* 142* 189*   Lipid Profile: No results for input(s): CHOL, HDL, LDLCALC, TRIG, CHOLHDL, LDLDIRECT in the last 72 hours. Thyroid Function Tests: No results for input(s): TSH, T4TOTAL, FREET4, T3FREE, THYROIDAB in the last 72 hours. Anemia Panel: No results for input(s): VITAMINB12, FOLATE, FERRITIN, TIBC, IRON, RETICCTPCT in the last 72 hours. Sepsis Labs: Recent Labs  Lab 08/20/19 2035  LATICACIDVEN 1.3    Recent Results (from the past 240 hour(s))  Urine culture     Status: None   Collection Time: 08/20/19  5:19 PM   Specimen: In/Out Cath Urine  Result Value Ref Range Status   Specimen Description   Final    IN/OUT CATH URINE Performed at [facility], [address] [place], [place] [address]    Special Requests   Final    NONE Performed at [facility], [address], [place], [place] [address]    Culture   Final    NO GROWTH Performed at [facility], [place] [address], [place], [place] [address]    Report Status 08/21/2019 FINAL  Final  Culture, blood (routine x 2)     Status: None (Preliminary result)   Collection Time: 08/20/19  5:21 PM   Specimen: BLOOD  Result Value Ref Range Status   Specimen Description BLOOD RIGHT ANTECUBITAL  Final   Special Requests   Final    BOTTLES DRAWN AEROBIC AND ANAEROBIC Blood Culture adequate volume   Culture   Final    NO GROWTH 3 DAYS Performed at [facility], [address] [place], [place] [address]    Report Status PENDING  Incomplete  SARS Coronavirus 2 by RT PCR (hospital order, performed in [place]  hospital lab) Nasopharyngeal Nasopharyngeal Swab     Status: None   Collection Time: 08/20/19  5:47 PM   Specimen: Nasopharyngeal Swab  Result Value Ref Range Status   SARS Coronavirus 2 NEGATIVE NEGATIVE Final    Comment: (NOTE) If result is NEGATIVE SARS-CoV-2 target nucleic acids are NOT DETECTED. The SARS-CoV-2 RNA is generally detectable in upper and lower  respiratory specimens during the acute phase of infection. The lowest  concentration of SARS-CoV-2 viral copies this assay can detect is 250  copies / mL. A negative result does not preclude SARS-CoV-2 infection  and should not be used as the sole basis for treatment or other  patient management decisions.  A negative result may occur with  improper specimen collection / handling, submission of specimen other  than nasopharyngeal swab, presence of viral mutation(s) within the  areas targeted by this assay, and inadequate number of viral copies  (<250 copies / mL). A negative result must be combined with clinical  observations, patient history, and epidemiological information. If result is POSITIVE SARS-CoV-2 target nucleic acids are DETECTED. The SARS-CoV-2 RNA is generally detectable in upper and lower  respiratory specimens dur ing the acute phase of infection.  Positive  results are indicative of active infection with SARS-CoV-2.  Clinical  correlation with patient history and other diagnostic information is  necessary to determine patient infection status.  Positive results do  not rule out bacterial infection or co-infection with other viruses. If result is PRESUMPTIVE POSTIVE SARS-CoV-2 nucleic acids MAY BE PRESENT.   A presumptive positive result was obtained  on the submitted specimen  and confirmed on repeat testing.  While 2019 novel coronavirus  (SARS-CoV-2) nucleic acids may be present in the submitted sample  additional confirmatory testing may be necessary for epidemiological  and / or clinical management  purposes  to differentiate between  SARS-CoV-2 and other Sarbecovirus currently known to infect humans.  If clinically indicated additional testing with an alternate test  methodology [phone]) is advised. The SARS-CoV-2 RNA is generally  detectable in upper and lower respiratory sp ecimens during the acute  phase of infection. The expected result is Negative. Fact Sheet for Patients:  StrictlyIdeas.no Fact Sheet for Healthcare Providers: BankingDealers.co.za This test is not yet approved or cleared by the [place] FDA and has been authorized for detection and/or diagnosis of SARS-CoV-2 by FDA under an Emergency Use Authorization (EUA).  This EUA will remain in effect (meaning this test can be used) for the duration of the COVID-19 declaration under Section 564(b)(1) of the Act, 21 U.S.C. section 360bbb-3(b)(1), unless the authorization is terminated or revoked sooner. Performed at [facility], [address], [place], [place] [address]       Radiology Studies: No results found.      Scheduled Meds: . apixaban  5 mg Oral BID  . Chlorhexidine Gluconate Cloth  6 each Topical Daily  . cholecalciferol  2,000 Units Oral Daily  . diltiazem  120 mg Oral Daily  . ferrous [id number] C-folic acid  1 capsule Oral BID PC  . fluticasone  2 spray Each Nare Daily  . furosemide  20 mg Oral Daily  . insulin aspart  0-9 Units Subcutaneous TID WC  . insulin detemir  14 Units Subcutaneous QHS  . isosorbide mononitrate  30 mg Oral BID  . levothyroxine  125 mcg Oral Daily  . loratadine  10 mg Oral Daily  . pantoprazole  40 mg Oral Daily  . senna  1 tablet Oral Daily  . sodium chloride flush  3 mL Intravenous Q12H  . umeclidinium-vilanterol  1 puff Inhalation Daily   Continuous Infusions:    LOS: 6 days    Time spent: 30 min    Ivor Costa, DO [facility] PAGER is on AMION  If 7PM-7AM, please contact  night-coverage [email or web address] Password [facility] 08/26/2019, 2:47 PM

## 2019-08-26 NOTE — Progress Notes (Addendum)
Subjective:   Status post left intratrochanteric fracture with ORIF.  Postop 2 weeks. Patient reports pain as 7 on 0-10 scale.   Patient is well, and has had no acute complaints or problems Current plan is for d/c to SNF when medically appropriate. Negative for chest pain and shortness of breath Fever: no  Gastrointestinal:Negative for nausea and vomiting  Objective: Vital signs in last 24 hours: Temp:  [97.3 F (36.3 C)-98.1 F (36.7 C)] 97.9 F (36.6 C) (11/08 0728) Pulse Rate:  [57-85] 57 (11/08 0728) Resp:  [16-18] 18 (11/08 0728) BP: (126-148)/(55-69) 148/69 (11/08 0728) SpO2:  [97 %-99 %] 98 % (11/08 0728) Weight:  [102.6 kg] 102.6 kg (11/08 0610)  Intake/Output from previous day:  Intake/Output Summary (Last 24 hours) at 08/26/2019 0800 Last data filed at 08/26/2019 0615 Gross per 24 hour  Intake 332.4 ml  Output 900 ml  Net -567.6 ml    Intake/Output this shift: No intake/output data recorded.  Labs: Recent Labs    08/24/19 0559 08/25/19 0450 08/26/19 0535  HGB 9.3* 9.5* 9.6*   Recent Labs    08/25/19 0450 08/26/19 0535  WBC 10.7* 10.7*  RBC 3.68* 3.68*  HCT 31.9* 31.6*  PLT 250 245   Recent Labs    08/25/19 0450 08/26/19 0535  NA 139 136  K 4.2 4.0  CL 107 105  CO2 24 23  BUN 20 16  CREATININE 0.96 0.90  GLUCOSE 104* 150*  CALCIUM 8.8* 8.4*   No results for input(s): LABPT, INR in the last 72 hours.   EXAM General - Patient is Alert, Appropriate and Oriented Extremity - ABD soft Sensation intact distally Intact pulses distally Dorsiflexion/Plantar flexion intact No cellulitis present Compartment soft Dressing/Incision -incisions are intact with staples intact.  The distal incision appears to be quite dry and ready for staple removal.  The proximal incisions have blistering surrounding that have had daily dressing changes.  There is mild serous drainage with no signs of active infection. Motor Function - intact, moving foot and toes  well on exam.   Past Medical History:  Diagnosis Date  . Arthritis   . CHF (congestive heart failure) ([place])   . COPD (chronic obstructive pulmonary disease) ([place])   . Diabetes mellitus without complication ([place])   . Pulmonary hypertension ([place])   . Tricuspid regurgitation    Assessment/Plan:    Principal Problem:   New onset atrial fibrillation with RVR Active Problems:   Hip fracture (HCC)   COPD (chronic obstructive pulmonary disease) (HCC)   Type II diabetes mellitus with renal manifestations (HCC)   Hyperlipidemia   Hyperkalemia   Coronary artery disease   Chronic diastolic CHF (congestive heart failure) (HCC)   Hypothyroidism   Hypertension associated with diabetes (HCC)   Elevated liver enzymes   Acute renal failure superimposed on stage 3a chronic kidney disease (HCC)   Altered mental status  Estimated body mass index is 41.39 kg/m as calculated from the following:   Height as of this encounter: 5\' 2"  (1.575 m).   Weight as of this encounter: 102.6 kg. Up with therapy  Labs reviewed this AM, WBC is 10.7.  No SOB, chest pain or urinary symptoms. Encouraged incentive spirometer. Hg 9.6 this AM, continue to monitor. Patient has had a BM. Continue with PT today, current plan is for d/c to SNF.  Will monitor how patient performs today.  DVT Prophylaxis - TED hose and Plavix 50% weightbearing to the left leg with therapy. Distal incision can have  staples removed today.  Proximal incision needs staples to remain for at least 3 days with daily dressing changes.  Dedra Skeens, PA-C [facility] 08/26/2019, 8:00 AM    Addendum:  I have reviewed the patient's history and recent lab work, and have evaluated the patient as well.  I agree with the assessment of Dedra Skeens, PA-C, regarding her wounds.  The staples in her distal incision can be removed today, but the other staples should remain in place for a few more days.  She certainly may be  discharged today if she is cleared from a medical standpoint, and return to the office toward the end of the week for staple removal.  Thank you for the excellent care you have provided this patient.  Maryagnes Amos, MD [facility] 08/26/2019, 12:15 PM

## 2019-08-26 NOTE — Progress Notes (Signed)
Pt have atarax TID PRN and last dose was given at 2124. Next dose will be at 0524, but pt states " I need the medicine now" Page prime. Will continue to monitor.

## 2019-08-27 LAB — BASIC METABOLIC PANEL
Anion gap: 7 (ref 5–15)
BUN: 14 mg/dL (ref 8–23)
CO2: 25 mmol/L (ref 22–32)
Calcium: 8.6 mg/dL — ABNORMAL LOW (ref 8.9–10.3)
Chloride: 106 mmol/L (ref 98–111)
Creatinine, Ser: 1.07 mg/dL — ABNORMAL HIGH (ref 0.44–1.00)
GFR calc Af Amer: 57 mL/min — ABNORMAL LOW (ref >60)
GFR calc non Af Amer: 49 mL/min — ABNORMAL LOW (ref >60)
Glucose, Bld: 178 mg/dL — ABNORMAL HIGH (ref 70–99)
Potassium: 4.2 mmol/L (ref 3.5–5.1)
Sodium: 138 mmol/L (ref 135–145)

## 2019-08-27 LAB — GLUCOSE, CAPILLARY
Glucose-Capillary: 148 mg/dL — ABNORMAL HIGH (ref 70–99)
Glucose-Capillary: 262 mg/dL — ABNORMAL HIGH (ref 70–99)
Glucose-Capillary: 255 mg/dL — ABNORMAL HIGH (ref 70–99)
Glucose-Capillary: 221 mg/dL — ABNORMAL HIGH (ref 70–99)

## 2019-08-27 LAB — CBC
HCT: 33 % — ABNORMAL LOW (ref 36.0–46.0)
Hemoglobin: 9.6 g/dL — ABNORMAL LOW (ref 12.0–15.0)
MCH: 26.1 pg (ref 26.0–34.0)
MCHC: 29.1 g/dL — ABNORMAL LOW (ref 30.0–36.0)
MCV: 89.7 fL (ref 80.0–100.0)
Platelets: 256 10*3/uL (ref 150–400)
RBC: 3.68 MIL/uL — ABNORMAL LOW (ref 3.87–5.11)
RDW: 24.7 % — ABNORMAL HIGH (ref 11.5–15.5)
WBC: 10.7 10*3/uL — ABNORMAL HIGH (ref 4.0–10.5)
nRBC: 0 % (ref 0.0–0.2)

## 2019-08-27 LAB — MAGNESIUM: Magnesium: 1.8 mg/dL (ref 1.7–2.4)

## 2019-08-27 MED ORDER — FUROSEMIDE 10 MG/ML IJ SOLN
40.0000 mg | Freq: Every day | INTRAMUSCULAR | Status: DC
  Administered 2019-08-27 – 2019-08-29 (×3): 40 mg via INTRAVENOUS
  Filled 2019-08-27 (×3): qty 4

## 2019-08-27 MED ORDER — MAGNESIUM SULFATE IN D5W 1-5 GM/100ML-% IV SOLN
1.0000 g | Freq: Once | INTRAVENOUS | Status: AC
  Administered 2019-08-27: 1 g via INTRAVENOUS
  Filled 2019-08-27 (×2): qty 100

## 2019-08-27 MED ORDER — HYDROXYZINE HCL 10 MG PO TABS
10.0000 mg | ORAL_TABLET | Freq: Three times a day (TID) | ORAL | Status: DC | PRN
  Administered 2019-08-27 – 2019-08-29 (×6): 10 mg via ORAL
  Filled 2019-08-27 (×7): qty 1

## 2019-08-27 MED ORDER — SODIUM CHLORIDE 0.9 % IV SOLN
INTRAVENOUS | Status: DC | PRN
  Administered 2019-08-27: 09:00:00 via INTRAVENOUS

## 2019-08-27 NOTE — TOC Progression Note (Addendum)
Transition of Care [facility]) - Progression Note    Patient Details  Name: Destiny Zuniga MRN: [id number] Date of Birth: 08-Oct-1941  Transition of [facility]) CM/SW [place], LCSW Phone Number: 08/27/2019, 10:53 AM  Clinical Narrative: CSW spoke with patient's daughter. Patient wants placement closer to home. Per daughter, top 3 preferences are [place] (Patent attorney for admissions coordinator), [place], and [place].    12:06 pm: No call back from [facility] yet. Got fax number from secretary so sent referral over. Left voicemail for admissions coordinator at [facility].  2:31 pm: [employer] again. Left a second voicemail for admissions coordinator. No call from [facility] yet. Got fax number from secretary so sent referral over. Spoke with admissions coordinator at [facility] and faxed over referral.  Expected Discharge Plan: [place] Barriers to Discharge: Continued Medical Work up  Expected Discharge Plan and Services Expected Discharge Plan: [place] In-house Referral: NA Discharge Planning Services: NA Post Acute Care Choice: [place] Living arrangements for the past 2 months: [place], Single Family Home                 DME Arranged: N/A DME Agency: NA                   Social Determinants of Health ([place]) Interventions    Readmission Risk Interventions Readmission Risk Prevention Plan 08/24/2019  Transportation Screening Complete  PCP or Specialist Appt within 3-5 Days Complete  HRI or Home Care Consult Complete  Social Work Consult for [place] Planning/Counseling Complete  Palliative Care Screening Not Applicable  Medication Review Press photographer) Referral to Pharmacy

## 2019-08-27 NOTE — Progress Notes (Signed)
PROGRESS NOTE    Destiny Zuniga  YKD:[id number] DOB: [birth year] DOA: 08/20/2019 PCP: Kirk Ruths, MD    Brief Narrative:   Destiny Zuniga is an 78 y.o. female with history significant for COPD, type II DM, pulmonary hypertension, tricuspid regurgitation, CHF, recent left hip fracture was brought to the hospital because of altered mental status.  She was found to have acute kidney injury with creatinine of 5.21.  Baseline creatinine is around 1.15.  Interim History: 1. Cre is improving 5.21 -->4.47-->2.34 -->1.4-->0.99-->0.96 -->1.07 today. US-renal has no hydronephrosis 2. US-liver: Normal liver Doppler ultrasound.  Liver function improvement 3. Has left hip pain which is likely from recent left hip surgery (ORIF) 4. CT head is negative for acute issues 10/5. 5. 11/6: Pt developed new onset A fib with RVR 11/6, card was consulted, started Eliquis, stopped ASA and plavix, will get 2d echo. 6. Dr. Leighton Roach saw pt 11/7. Stop Dilt IV and changed to PO.  7. 11/8: HR 60-70s, per Dr. Percival Spanish, [place] to stop the Coreg, continue Cardizem and change to carizem CD.   8. 11/8, 2D echo showed:   1. Global right ventricle has moderately reduced systolic function.The right ventricular size is mildly enlarged. No increase in right ventricular wall thickness.   2. Right atrial size was moderately dilated.   3. Severely elevated pulmonary artery systolic pressure.   4. Left ventricular ejection fraction, by visual estimation, is 60 to 65%. The left ventricle has normal function. There is no left ventricular hypertrophy.   5. Left ventricular diastolic parameters are indeterminate.   6. Left atrial size was normal.   7. Tricuspid valve regurgitation moderate-severe.   8. The tricuspid regurgitant velocity is 4.09 m/s, and with an assumed right atrial pressure of 10 mmHg, the estimated right ventricular systolic pressure is severely elevated at 76.9 mmHg.  9. Develop  urinary retention -->placed Foley cath again 10. 11/9 daughter report pt has worse anxiety in night.   Assessment & Plan:   Principal Problem:   New onset atrial fibrillation with RVR Active Problems:   Hip fracture (HCC)   COPD (chronic obstructive pulmonary disease) (HCC)   Type II diabetes mellitus with renal manifestations (HCC)   Hyperlipidemia   Hyperkalemia   Coronary artery disease   Chronic diastolic CHF (congestive heart failure) (HCC)   Hypothyroidism   Hypertension associated with diabetes (HCC)   Elevated liver enzymes   Acute renal failure superimposed on stage 3a chronic kidney disease (HCC)   Altered mental status   New onset Afib with RVR:  Patient developed Afib with RVR.  HR up to 130s. Now is better controlled with IV cardizem. She had bradycardia in this admission. 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 CHADS2VASc is 7, Nee anticoagulants. TSH normal on 08/20/2019. Cardiology was consulted, Dr. Idolina Primer evaluated pt 11/6 --> started eliquis. Dr. Percival Spanish for cardiology still patient, since patient's heart rate is better controlled, switched IV Cardizem to oral med.  11/8: HR 60-70s, per Dr. Percival Spanish, [place] to stop the Coreg, continue Cardizem and change to CD.  2d echo on 11/8 showed EF 60 to 65%, TR, Pulmonary HTN, mild RV dysfunction. Per Dr. Percival Spanish, will needs follow up and possible work up in the future.  11/9: HR is 60-70s.   -Highly appreciate card consultation, will follow up recommendations. - switched to diltiazem CD 120 mg daily - d/c'ed coreg 11/8 - may need to see EP for tachy-brady syndrome  - Eliquis 5 mg bid  -  No recent stenting, therefore we will stopped ASA and Plavix to minimize her bleeding risk, especially with her underlying anemia - Check echo. pending   Acute renal failure superimposed on stage 3a chronic kidney disease ([place]): improving. Cre is improving 5.21 -->4.47-->2.34 -->0.99-->0.96-->1.07  today. US-renal has no hydronephrosis. Consulted  nephrologist to assist with management. Per Dr. Holley Raring, no urgent indication for dialysis at the moment. - continue to hold cozarr -avoid NSAIDS.  Hyperkalemia: resovlve. K 4.2 today.   Hyponatremia: resolved.    Altered mental status/metabolic encephalopathy: improved, oriented x 3. CT-head negative. Ammonia level normal 18. Pt has anxiety, worse at night -Frequent neuro check -increased hydroxyzine dose from 5 to 10 mg prn  Leukocytosis: no fever. Improving. 12.1-->11.8 -->9.6 -->10.6 --> 10.7 -->10.7 -->10.7 -->no CBC. No clear evidence of infection at this time. This may be reactive. -f/u by CBC -Follow-up cultures -->so far negative bx and Ux  Chronic diastolic CHF: Lasix has been held.  2D echo on 10/05/2018 showed EF of 60 to 65%.  Patient has leg edema.  She has mild shortness of breath. Leg edema has worsened. Today, pt has 2+ leg edema. -low dose of oral lasix 20 mg daily -->increased to 40 mg daily -monitoring renal Fx  CAD status post PCI: no chest pain -Continue  Imdur  - d/c'ed ASA and plavis due to newly started Eliquis -Hold simvastatin because of elevated liver enzymes.    Elevated troponins: Trop 14, 110, 85. This is probably from demand ischemia/acute kidney injury.  No complaints of chest pain or shortness of breath.  Elevated liver enzymes: Etiology unclear. Liver US is negative. Hepatitis panel negative.  Improving.   Hepatic Function Latest Ref Rng & Units 08/26/2019 08/24/2019 08/23/2019  Total Protein 6.5 - 8.1 g/dL 5.2(L) 5.4(L) 5.0(L)  Albumin 3.5 - 5.0 g/dL 2.6(L) 2.7(L) 2.4(L)  AST 15 - 41 U/L 30 45(H) 47(H)  ALT 0 - 44 U/L 83(H) 139(H) 174(H)  Alk Phosphatase 38 - 126 U/L 125 135(H) 123  Total Bilirubin 0.3 - 1.2 mg/dL 1.3(H) 1.3(H) 1.5(H)  Bilirubin, Direct 0.0 - 0.2 mg/dL - - -    Hypertension: losartan have been held.  -prn oral hydralazine orally -on Cardizme and lasix    Type II diabetes mellitus with renal manifestations:  -Continue  Levemir -SSI    COPD and chronic hypoxemic respiratory failure: stable -Continue bronchodilators and oxygen via nasal cannula  Recent left hip fracture status post ORIF on 08/11/2019: -Prn oxycodone -11/8 consulted ortho, Dr. Roland Rack, for removing staples,  "I have reviewed the patient's history and recent lab work, and have evaluated the patient as well.  I agree with the assessment of Reche Dixon, PA-C, regarding her wounds.  The staples in her distal incision can be removed today, but the other staples should remain in place for a few more days.  She certainly may be discharged today if she is cleared from a medical standpoint, and return to the office toward the end of the week for staple removal". -highly appreciate Dr. Nicholaus Bloom Consultation.    DVT prophylaxis: on Eliquis Code Status: Full code amily Communication: called her daughter Disposition Plan: Discharge to SNF in 1 to 2 days, pending bed availability Barriers for discharge:   Consultants:   renal  Procedures:  US-renal  US-liver  Antimicrobials:    Subjective:   Has mild SOB, no CP, no nausea, vomiting, abdominal pain or diarrhea.  No fever or chills. Has leg edema bilaterally.  Objective: Vitals:  08/27/19 0407 08/27/19 0742 08/27/19 1523 08/27/19 2002  BP: (!) 123/50 130/67 (!) 130/52 (!) 145/54  Pulse: 65 (!) 59 63 71  Resp: '20 19 18 20  ' Temp: 97.8 F (36.6 C) 97.6 F (36.4 C) 97.6 F (36.4 C) 98.6 F (37 C)  TempSrc: Oral Oral Oral Oral  SpO2: 97% 99% 96% 98%  Weight: 103.3 kg     Height:        Intake/Output Summary (Last 24 hours) at 08/27/2019 2148 Last data filed at 08/27/2019 2012 Gross per 24 hour  Intake 110.55 ml  Output 2565 ml  Net -2454.45 ml   Filed Weights   08/25/19 0530 08/26/19 0610 08/27/19 0407  Weight: 104.3 kg 102.6 kg 103.3 kg    Examination: Physical Exam:  General: Not in acute distress HEENT: PERRL, EOMI, no scleral icterus, No JVD or bruit Cardiac: S1/S2,  RRR, No murmurs, gallops or rubs Pulm: Clear to auscultation bilaterally. No rales, wheezing, rhonchi or rubs. Abd: Soft, nondistended, nontender, no rebound pain, no organomegaly, BS present Ext: 2+ leg edema bilaterally. 2+DP/PT pulse bilaterally Musculoskeletal: No joint deformities, erythema, or stiffness, ROM full Skin: No rashes.  Neuro: Intermittently confused, currently oriented X3, cranial nerves II-XII grossly intact, moves all extremities.  Psych: Patient is not psychotic, no suicidal or hemocidal ideation.    Data Reviewed: I have personally reviewed following labs and imaging studies  CBC: Recent Labs  Lab 08/23/19 0432 08/24/19 0559 08/25/19 0450 08/26/19 0535 08/27/19 0358  WBC 9.6 10.9* 10.7* 10.7* 10.7*  NEUTROABS 7.0  --   --   --   --   HGB 8.4* 9.3* 9.5* 9.6* 9.6*  HCT 27.8* 31.9* 31.9* 31.6* 33.0*  MCV 84.0 87.2 86.7 85.9 89.7  PLT 225 258 250 245 875   Basic Metabolic Panel: Recent Labs  Lab 08/21/19 0513  08/23/19 0432 08/24/19 0559 08/25/19 0450 08/26/19 0535 08/27/19 0358  NA 131*   < > 137 138 139 136 138  K 4.9   < > 4.0 4.0 4.2 4.0 4.2  CL 94*   < > 105 105 107 105 106  CO2 24   < > '23 25 24 23 25  ' GLUCOSE 128*   < > 212* 105* 104* 150* 178*  BUN 100*   < > 50* 28* '20 16 14  ' CREATININE 4.47*   < > 1.40* 0.99 0.96 0.90 1.07*  CALCIUM 8.3*   < > 8.3* 8.5* 8.8* 8.4* 8.6*  MG  --   --  2.1  --  1.8 2.1 1.8  PHOS 6.5*  --   --   --   --   --   --    < > = values in this interval not displayed.   GFR: Estimated Creatinine Clearance: 48.1 mL/min (A) (by C-G formula based on SCr of 1.07 mg/dL (H)). Liver Function Tests: Recent Labs  Lab 08/21/19 0513 08/23/19 0432 08/24/19 0559 08/26/19 0535  AST 150* 47* 45* 30  ALT 323* 174* 139* 83*  ALKPHOS 136* 123 135* 125  BILITOT 1.8* 1.5* 1.3* 1.3*  PROT 5.2* 5.0* 5.4* 5.2*  ALBUMIN 2.5*  2.6* 2.4* 2.7* 2.6*   No results for input(s): LIPASE, AMYLASE in the last 168 hours. Recent Labs   Lab 08/22/19 1829  AMMONIA 18   Coagulation Profile: No results for input(s): INR, PROTIME in the last 168 hours. Cardiac Enzymes: No results for input(s): CKTOTAL, CKMB, CKMBINDEX, TROPONINI in the last 168 hours. BNP (last 3 results) No  results for input(s): PROBNP in the last 8760 hours. HbA1C: No results for input(s): HGBA1C in the last 72 hours. CBG: Recent Labs  Lab 08/26/19 2046 08/27/19 0744 08/27/19 1204 08/27/19 1658 08/27/19 2119  GLUCAP 188* 148* 262* 255* 221*   Lipid Profile: No results for input(s): CHOL, HDL, LDLCALC, TRIG, CHOLHDL, LDLDIRECT in the last 72 hours. Thyroid Function Tests: No results for input(s): TSH, T4TOTAL, FREET4, T3FREE, THYROIDAB in the last 72 hours. Anemia Panel: No results for input(s): VITAMINB12, FOLATE, FERRITIN, TIBC, IRON, RETICCTPCT in the last 72 hours. Sepsis Labs: No results for input(s): PROCALCITON, LATICACIDVEN in the last 168 hours.  Recent Results (from the past 240 hour(s))  Urine culture     Status: None   Collection Time: 08/20/19  5:19 PM   Specimen: In/Out Cath Urine  Result Value Ref Range Status   Specimen Description   Final    IN/OUT CATH URINE Performed at [facility], [address], [place], [place] [address]    Special Requests   Final    NONE Performed at [facility], [address], [place], [place] [address]    Culture   Final    NO GROWTH Performed at [facility], [place] [address], [place], [place] [address]    Report Status 08/21/2019 FINAL  Final  Culture, blood (routine x 2)     Status: None (Preliminary result)   Collection Time: 08/20/19  5:21 PM   Specimen: BLOOD  Result Value Ref Range Status   Specimen Description BLOOD RIGHT ANTECUBITAL  Final   Special Requests   Final    BOTTLES DRAWN AEROBIC AND ANAEROBIC Blood Culture adequate volume   Culture   Final    NO GROWTH 3 DAYS Performed at [facility], [address], [place], [place]  [address]    Report Status PENDING  Incomplete  SARS Coronavirus 2 by RT PCR (hospital order, performed in [place] hospital lab) Nasopharyngeal Nasopharyngeal Swab     Status: None   Collection Time: 08/20/19  5:47 PM   Specimen: Nasopharyngeal Swab  Result Value Ref Range Status   SARS Coronavirus 2 NEGATIVE NEGATIVE Final    Comment: (NOTE) If result is NEGATIVE SARS-CoV-2 target nucleic acids are NOT DETECTED. The SARS-CoV-2 RNA is generally detectable in upper and lower  respiratory specimens during the acute phase of infection. The lowest  concentration of SARS-CoV-2 viral copies this assay can detect is 250  copies / mL. A negative result does not preclude SARS-CoV-2 infection  and should not be used as the sole basis for treatment or other  patient management decisions.  A negative result may occur with  improper specimen collection / handling, submission of specimen other  than nasopharyngeal swab, presence of viral mutation(s) within the  areas targeted by this assay, and inadequate number of viral copies  (<250 copies / mL). A negative result must be combined with clinical  observations, patient history, and epidemiological information. If result is POSITIVE SARS-CoV-2 target nucleic acids are DETECTED. The SARS-CoV-2 RNA is generally detectable in upper and lower  respiratory specimens dur ing the acute phase of infection.  Positive  results are indicative of active infection with SARS-CoV-2.  Clinical  correlation with patient history and other diagnostic information is  necessary to determine patient infection status.  Positive results do  not rule out bacterial infection or co-infection with other viruses. If result is PRESUMPTIVE POSTIVE SARS-CoV-2 nucleic acids MAY BE PRESENT.   A  presumptive positive result was obtained on the submitted specimen  and confirmed on repeat testing.  While 2019 novel coronavirus  (SARS-CoV-2) nucleic acids may be present in the  submitted sample  additional confirmatory testing may be necessary for epidemiological  and / or clinical management purposes  to differentiate between  SARS-CoV-2 and other Sarbecovirus currently known to infect humans.  If clinically indicated additional testing with an alternate test  methodology [phone]) is advised. The SARS-CoV-2 RNA is generally  detectable in upper and lower respiratory sp ecimens during the acute  phase of infection. The expected result is Negative. Fact Sheet for Patients:  StrictlyIdeas.no Fact Sheet for Healthcare Providers: BankingDealers.co.za This test is not yet approved or cleared by the [place] FDA and has been authorized for detection and/or diagnosis of SARS-CoV-2 by FDA under an Emergency Use Authorization (EUA).  This EUA will remain in effect (meaning this test can be used) for the duration of the COVID-19 declaration under Section 564(b)(1) of the Act, 21 U.S.C. section 360bbb-3(b)(1), unless the authorization is terminated or revoked sooner. Performed at [facility], [address], [place], [place] [address]       Radiology Studies: No results found.      Scheduled Meds: . apixaban  5 mg Oral BID  . Chlorhexidine Gluconate Cloth  6 each Topical Daily  . cholecalciferol  2,000 Units Oral Daily  . diltiazem  120 mg Oral Daily  . ferrous [id number] C-folic acid  1 capsule Oral BID PC  . fluticasone  2 spray Each Nare Daily  . furosemide  40 mg Intravenous Daily  . insulin aspart  0-9 Units Subcutaneous TID WC  . insulin detemir  14 Units Subcutaneous QHS  . isosorbide mononitrate  30 mg Oral BID  . levothyroxine  125 mcg Oral Daily  . loratadine  10 mg Oral Daily  . pantoprazole  40 mg Oral Daily  . senna  1 tablet Oral Daily  . sodium chloride flush  3 mL Intravenous Q12H  . umeclidinium-vilanterol  1 puff Inhalation Daily   Continuous Infusions: .  sodium chloride Stopped (08/27/19 1042)     LOS: 7 days    Time spent: 30 min    Ivor Costa, DO [facility] PAGER is on AMION  If 7PM-7AM, please contact night-coverage [email or web address] Password [facility] 08/27/2019, 9:48 PM

## 2019-08-27 NOTE — Progress Notes (Signed)
Physical Therapy Treatment Patient Details Name: Destiny Zuniga MRN: [id number] DOB: Nov 10, 1940 Today's Date: 08/27/2019    History of Present Illness Patient is a 78 year old female admitted from SNF with AMS, acute renal failure. S/P hip fx and ORIF 08/11/19. PMH to include: COPD, Home O2, DM, HLD, CAD, CHF, HTN.    PT Comments    Pt presented with deficits in strength, transfers, mobility, gait, balance, and activity tolerance.  Pt motivated to participate throughout the session requiring +2 assist for bed mobility and transfers, somewhat limited by pain to the L hip with movement.  Pt stood with BUE on the sink counter for stability and was able to pick her L foot off the floor multiple times and performed multiple low amplitude mini squats with fair eccentric and concentric control.  Multiple attempts made for pt to scoot laterally at the EOB with pt unable to do so.  Overall pt is progressing towards goals but remains very limited functionally compared to her baseline.  Pt will benefit from PT services in a SNF setting upon discharge to safely address above deficits for decreased caregiver assistance and eventual return to PLOF.     Follow Up Recommendations  SNF     Equipment Recommendations  None recommended by PT    Recommendations for Other Services       Precautions / Restrictions Precautions Precautions: Fall Restrictions Weight Bearing Restrictions: Yes LLE Weight Bearing: Partial weight bearing LLE Partial Weight Bearing Percentage or Pounds: 50 Other Position/Activity Restrictions: Per PA-C Horris Latino via phone call 08/24/19 pt to be 50% PWB on the LLE until 08/31/19 at which time she may be WBAT    Mobility  Bed Mobility Overal bed mobility: Needs Assistance Bed Mobility: Supine to Sit;Sit to Supine Rolling: Max assist   Supine to sit: +2 for physical assistance;Mod assist Sit to supine: +2 for physical assistance;Mod assist      Transfers Overall  transfer level: Needs assistance   Transfers: Sit to/from Stand Sit to Stand: +2 physical assistance;Mod assist         General transfer comment: Pt used BUEs to pull up from the sink and leaned on the sink counter for support in standing  Ambulation/Gait             General Gait Details: Pt able to march with LLE 2 x 5 but unable to amb this session   Stairs             Wheelchair Mobility    Modified Rankin (Stroke Patients Only)       Balance Overall balance assessment: Needs assistance Sitting-balance support: Feet supported;Single extremity supported Sitting balance-Leahy Scale: Fair Sitting balance - Comments: R lean away from surgical site.   Standing balance support: Bilateral upper extremity supported Standing balance-Leahy Scale: Poor                              Cognition Arousal/Alertness: Awake/alert Behavior During Therapy: WFL for tasks assessed/performed Overall Cognitive Status: Within Functional Limits for tasks assessed                                        Exercises Total Joint Exercises Ankle Circles/Pumps: Strengthening;Both;AROM;10 reps Quad Sets: Strengthening;Both;10 reps;5 reps Towel Squeeze: Strengthening;Both;5 reps Short Arc Quad: Strengthening;Both;10 reps Heel Slides: AROM;AAROM;Both;5 reps Hip ABduction/ADduction: AAROM;Both;10 reps  Straight Leg Raises: AAROM;Both;10 reps Long Arc Quad: Strengthening;Both;10 reps Other Exercises Other Exercises: Low amplitude mini squats x 5    General Comments        Pertinent Vitals/Pain Pain Assessment: 0-10 Pain Score: 1  Pain Location: L hip/LE Pain Descriptors / Indicators: Guarding;Discomfort;Sore Pain Intervention(s): Premedicated before session;Monitored during session    Home Living                      Prior Function            PT Goals (current goals can now be found in the care plan section) Progress towards PT goals:  Progressing toward goals    Frequency    7X/week      PT Plan Current plan remains appropriate    Co-evaluation              AM-PAC PT "6 Clicks" Mobility   Outcome Measure  Help needed turning from your back to your side while in a flat bed without using bedrails?: A Lot Help needed moving from lying on your back to sitting on the side of a flat bed without using bedrails?: A Lot Help needed moving to and from a bed to a chair (including a wheelchair)?: A Lot Help needed standing up from a chair using your arms (e.g., wheelchair or bedside chair)?: A Lot Help needed to walk in hospital room?: Total Help needed climbing 3-5 steps with a railing? : Total 6 Click Score: 10    End of Session Equipment Utilized During Treatment: Gait belt;Oxygen Activity Tolerance: Patient tolerated treatment well Patient left: in bed;with call bell/phone within reach;with bed alarm set;with family/visitor present Nurse Communication: Mobility status PT Visit Diagnosis: Unsteadiness on feet (R26.81);Muscle weakness (generalized) (M62.81);Difficulty in walking, not elsewhere classified (R26.2);History of falling (Z91.81);Pain;Other abnormalities of gait and mobility (R26.89) Pain - Right/Left: Left Pain - part of body: Hip     Time: 7262-0355 PT Time Calculation (min) (ACUTE ONLY): 44 min  Charges:  $Therapeutic Exercise: 23-37 mins $Therapeutic Activity: 8-22 mins                     D. Scott Dakoda Bassette PT, DPT 08/27/19, 1:42 PM

## 2019-08-27 NOTE — Care Management Important Message (Signed)
Important Message  Patient Details  Name: Destiny Zuniga MRN: [id number] Date of Birth: Apr 16, 1941   Medicare Important Message Given:  Yes     Dannette Barbara 08/27/2019, 12:34 PM

## 2019-08-27 NOTE — Progress Notes (Signed)
Progress Note  Patient Name: Destiny Zuniga Date of Encounter: 08/27/2019  Primary Cardiologist: [facility], Dr. Mariah Milling consult; [facility]  Subjective   Reports she does not feel well this AM. Joined by her granddaughter this AM, also reporting that the patient had a rough night. She feels SOB. Of note, she states that she is on 2L  at home; however, previous documentation notes 3L. She reports pedal edema this AM, which she states is new for her. She denies any chest pain, palpitations, or racing HR. She notes back pain due to the hospital bed. She reports that she would like to get out of bed and move around.  Inpatient Medications    Scheduled Meds: . apixaban  5 mg Oral BID  . Chlorhexidine Gluconate Cloth  6 each Topical Daily  . cholecalciferol  2,000 Units Oral Daily  . diltiazem  120 mg Oral Daily  . ferrous fumarate-b12-vitamic C-folic acid  1 capsule Oral BID PC  . fluticasone  2 spray Each Nare Daily  . furosemide  20 mg Oral Daily  . insulin aspart  0-9 Units Subcutaneous TID WC  . insulin detemir  14 Units Subcutaneous QHS  . isosorbide mononitrate  30 mg Oral BID  . levothyroxine  125 mcg Oral Daily  . loratadine  10 mg Oral Daily  . pantoprazole  40 mg Oral Daily  . senna  1 tablet Oral Daily  . sodium chloride flush  3 mL Intravenous Q12H  . umeclidinium-vilanterol  1 puff Inhalation Daily   Continuous Infusions: . sodium chloride 10 mL/hr at 08/27/19 0839   PRN Meds: sodium chloride, acetaminophen **OR** acetaminophen, albuterol, hydrALAZINE, hydrOXYzine, nitroGLYCERIN, ondansetron **OR** ondansetron (ZOFRAN) IV, oxyCODONE, polyethylene glycol, polyvinyl alcohol   Vital Signs    Vitals:   08/26/19 1639 08/26/19 1953 08/27/19 0407 08/27/19 0742  BP: (!) 153/67 (!) 133/59 (!) 123/50 130/67  Pulse: 61 80 65 (!) 59  Resp: 18 20 20 19   Temp: 97.7 F (36.5 C) 97.9 F (36.6 C) 97.8 F (36.6 C) 97.6 F (36.4 C)  TempSrc: Oral Oral Oral Oral   SpO2: 95% 100% 97% 99%  Weight:   103.3 kg   Height:        Intake/Output Summary (Last 24 hours) at 08/27/2019 1011 Last data filed at 08/26/2019 1300 Gross per 24 hour  Intake 240 ml  Output 450 ml  Net -210 ml   Last 3 Weights 08/27/2019 08/26/2019 08/25/2019  Weight (lbs) 227 lb 11.2 oz 226 lb 4.8 oz 229 lb 15 oz  Weight (kg) 103.284 kg 102.649 kg 104.3 kg      Telemetry    Atrial flutter with rates 60s-high 80s - Personally Reviewed  ECG    No new tracings- Personally Reviewed  Physical Exam   GEN: Obese female. Joined by granddaughter. NAD.  Neck: JVD difficult to assess due to body habitus. Cardiac: irregular rhythm with controlled rate, no murmurs, rubs, or gallops.  Respiratory: Diffuse bilateral wheezing. GI: Obese, nontender, non-distended  MS: moderate to 1+ bilateral LEE; No deformity. Neuro:  Nonfocal  Psych: Normal affect   Labs    High Sensitivity Troponin:   Recent Labs  Lab 08/10/19 1140 08/20/19 1543 08/20/19 2056  TROPONINIHS 14 110* 85*      Cardiac EnzymesNo results for input(s): TROPONINI in the last 168 hours. No results for input(s): TROPIPOC in the last 168 hours.   Chemistry Recent Labs  Lab 08/23/19 [phone] 08/24/19 0559 08/25/19 0450 08/26/19 0535  08/27/19 0358  NA 137 138 139 136 138  K 4.0 4.0 4.2 4.0 4.2  CL 105 105 107 105 106  CO2 23 25 24 23 25   GLUCOSE 212* 105* 104* 150* 178*  BUN 50* 28* 20 16 14   CREATININE 1.40* 0.99 0.96 0.90 1.07*  CALCIUM 8.3* 8.5* 8.8* 8.4* 8.6*  PROT 5.0* 5.4*  --  5.2*  --   ALBUMIN 2.4* 2.7*  --  2.6*  --   AST 47* 45*  --  30  --   ALT 174* 139*  --  83*  --   ALKPHOS 123 135*  --  125  --   BILITOT 1.5* 1.3*  --  1.3*  --   GFRNONAA 36* 54* 56* >60 49*  GFRAA 41* >60 >60 >60 57*  ANIONGAP 9 8 8 8 7      Hematology Recent Labs  Lab 08/25/19 0450 08/26/19 0535 08/27/19 0358  WBC 10.7* 10.7* 10.7*  RBC 3.68* 3.68* 3.68*  HGB 9.5* 9.6* 9.6*  HCT 31.9* 31.6* 33.0*  MCV 86.7 85.9  89.7  MCH 25.8* 26.1 26.1  MCHC 29.8* 30.4 29.1*  RDW 23.2* 24.1* 24.7*  PLT 250 245 256    BNP Recent Labs  Lab 08/20/19 1543  BNP 280.0*     DDimer No results for input(s): DDIMER in the last 168 hours.   Radiology    No results found.  Cardiac Studies   08/26/19 ECHO:  1. Global right ventricle has moderately reduced systolic function.The right ventricular size is mildly enlarged. No increase in right ventricular wall thickness. 2. Right atrial size was moderately dilated. 3. Severely elevated pulmonary artery systolic pressure. 4. Left ventricular ejection fraction, by visual estimation, is 60 to 65%. The left ventricle has normal function. There is no left ventricular hypertrophy. 5. Left ventricular diastolic parameters are indeterminate. 6. Left atrial size was normal. 7. Tricuspid valve regurgitation moderate-severe. 8. The tricuspid regurgitant velocity is 4.09 m/s, and with an assumed right atrial pressure of 10 mmHg, the estimated right ventricular systolic pressure is severely elevated at 76.9 mmHg.  Patient Profile     78 y.o. female with a history of CAD, prior stenting to the LAD and LCx, HFpEF, pulmonary HTN, chronic hypoxic respiratory failure on [place] oxygen 3L, COPD, insulin-dependent diabetes, morbid obesity, recent hospitalization approximately 2 weeks ago for fall with L hip fracture s/p surgical repair 10/24/20and discharged on ASA and Plavix, and present to the hospital with AMS 08/20/19 and noted to be in Afib with RVR.  Assessment & Plan    New onset Afib/flutter with RVR --Intermittent sx when in Afib. Developed Afib ~1AM 11/5 per review of documentation. --Telemetry today more consistent with atrial flutter. --Reportedly bradycardic when in NSR. --CHA2DS2VASc score of at least  7 (CHF, HTN, agex2, DM2, vascular, female). --Started on Eliquis 5mg  BID over the weekend. Continue. ASA and Plavix (for which she was taking s/p hip surgery) were  stopped to minimize risk of bleeding.  --Currently rate controlled s/p discontinuation of Coreg over the weekend due to slow ventricular rate.  --Continued on Cardizem CD.  --Echo as above LVEF 60-65%. --Once therapeutically anticoagulated, could consider DCCV as an outpatient if still in atrial flutter at follow-up given she is is intermittently sx.   HFpEF EF 60-65% Pulmonary Hypertension --SOB continues on 3L [place] oxygen. Unclear if on 2L or 3L at home. --Reports she feels as if she is holding on to fluid this AM. She has noted pedal edema. --  Echo as above with global reduction of RV, mild RVE, moderate RAE. Consider also that moderate to severe TR could contribute to SOB.  --Volume up on exam with more edema noted than previously on physical exam.  Volume retention likely in setting of RVR s/p IVF for AKI earlier this admission with IVF now discontinued. Also considered is LEE secondary to third spacing and dependent edema. --Started on IV Lasix 40mg  daily this AM by IM. Continue gentle diuresis with close monitoring of renal function. Cr 0.90  1.07 with BUN 16  14. --Net -7.5L for admission and +33cc over the last 24h. Wt 104.5kg  103.3kg. --Consider follow-up in the pulmonary hypertension clinic.    CKDIII --Renal function improved since s/p IVF. Overnight, renal function showed Cr bump and patient c/o SOB this morning. Started on IV lasix by IM. Recommend very close monitoring of renal function given CKD. Nephrology consulted.   Anemia --Likely multifactorial in the setting of acute blood loss s/p surgery as well as likely anemia of chronic dz. S/p hip fracture and repair. As above, ASA and Plavix stopped with start of Eliquis. Current Hgb stable at 9.6.  Prior mechanical fall versus syncope --Admitted 07/2019 with fall versus syncope with subsequent hip fx requiring surgical repair. Etiology of all unknown. Per review of previous documentation, it appears that the patient had recently  undergone echo and 48h holter by primary cardiologist before this admission. --Cannot exclude post-termination pause given her recent diagnosis of Afib/flutter; however, the 48h monitor reportedly did not show any signs of arrhythmia.  --No significant pauses or arrhythmias on telemetry though bradycardic at admission. --As an outpatient, could discuss further ischemic evaluation given known CAD with mild troponin elevation and per patient's primary cardiologist via [facility].   Elevated HS Tn without CP History of CAD involving the native coronary arteries --No current CP. --HS Tn minimally elevated at 110 and not consistent with ACS. Likely supply demand ischemia in the setting of ARF, anemia, RVR.  --Echo as above. Normal EF. --ASA and Plavix discontinued with initiation of Eliquis as above.  --Continue PTA imdur. --Statin on hold given elevated liver enzymes at admission.  --As above, would benefit from further ischemic evaluation with timing to be determined by primary cardiologist.   HTN --Current BP 130/67. Continue IV lasix. Losartan on hold due to AKI. Continue Imdur and Cardizem. Continue PRN hydralazine.  Transaminitis --Statin on hold and can likely restart at discharge given improving ALT with follow-up labs to monitor.    For questions or updates, please contact [facility] Please consult [email or web address] for contact info under        Signed, Lennon AlstromJacquelyn D Alexsia Klindt, PA-C  08/27/2019, 10:11 AM

## 2019-08-28 DIAGNOSIS — I50813 Acute on chronic right heart failure: Secondary | ICD-10-CM

## 2019-08-28 DIAGNOSIS — I272 Pulmonary hypertension, unspecified: Secondary | ICD-10-CM

## 2019-08-28 LAB — COMPREHENSIVE METABOLIC PANEL
ALT: 59 U/L — ABNORMAL HIGH (ref 0–44)
AST: 25 U/L (ref 15–41)
Albumin: 2.9 g/dL — ABNORMAL LOW (ref 3.5–5.0)
Alkaline Phosphatase: 142 U/L — ABNORMAL HIGH (ref 38–126)
Anion gap: 7 (ref 5–15)
BUN: 12 mg/dL (ref 8–23)
CO2: 27 mmol/L (ref 22–32)
Calcium: 8.7 mg/dL — ABNORMAL LOW (ref 8.9–10.3)
Chloride: 104 mmol/L (ref 98–111)
Creatinine, Ser: 1.06 mg/dL — ABNORMAL HIGH (ref 0.44–1.00)
GFR calc Af Amer: 58 mL/min — ABNORMAL LOW (ref >60)
GFR calc non Af Amer: 50 mL/min — ABNORMAL LOW (ref >60)
Glucose, Bld: 186 mg/dL — ABNORMAL HIGH (ref 70–99)
Potassium: 3.9 mmol/L (ref 3.5–5.1)
Sodium: 138 mmol/L (ref 135–145)
Total Bilirubin: 1.1 mg/dL (ref 0.3–1.2)
Total Protein: 5.8 g/dL — ABNORMAL LOW (ref 6.5–8.1)

## 2019-08-28 LAB — CULTURE, BLOOD (ROUTINE X 2)
Culture: NO GROWTH
Special Requests: ADEQUATE

## 2019-08-28 LAB — CBC
HCT: 33.9 % — ABNORMAL LOW (ref 36.0–46.0)
Hemoglobin: 10.3 g/dL — ABNORMAL LOW (ref 12.0–15.0)
MCH: 25.9 pg — ABNORMAL LOW (ref 26.0–34.0)
MCHC: 30.4 g/dL (ref 30.0–36.0)
MCV: 85.4 fL (ref 80.0–100.0)
Platelets: 292 10*3/uL (ref 150–400)
RBC: 3.97 MIL/uL (ref 3.87–5.11)
RDW: 24.7 % — ABNORMAL HIGH (ref 11.5–15.5)
WBC: 10.9 10*3/uL — ABNORMAL HIGH (ref 4.0–10.5)
nRBC: 0 % (ref 0.0–0.2)

## 2019-08-28 LAB — GLUCOSE, CAPILLARY
Glucose-Capillary: 170 mg/dL — ABNORMAL HIGH (ref 70–99)
Glucose-Capillary: 259 mg/dL — ABNORMAL HIGH (ref 70–99)
Glucose-Capillary: 157 mg/dL — ABNORMAL HIGH (ref 70–99)
Glucose-Capillary: 233 mg/dL — ABNORMAL HIGH (ref 70–99)

## 2019-08-28 LAB — MAGNESIUM: Magnesium: 1.8 mg/dL (ref 1.7–2.4)

## 2019-08-28 MED ORDER — MAGNESIUM SULFATE IN D5W 1-5 GM/100ML-% IV SOLN
1.0000 g | Freq: Once | INTRAVENOUS | Status: AC
  Administered 2019-08-28: 1 g via INTRAVENOUS
  Filled 2019-08-28: qty 100

## 2019-08-28 NOTE — TOC Progression Note (Addendum)
Transition of Care [facility]) - Progression Note    Patient Details  Name: Destiny Zuniga MRN: [id number] Date of Birth: 1941/07/25  Transition of Care [facility]) CM/SW [place], LCSW Phone Number: 08/28/2019, 9:16 AM  Clinical Narrative: [place] calling [facility] admissions coordinator again. No answer. Fax to [facility] did not go through yesterday. Faxed again this morning. Left voicemail for [facility] admissions coordinator to see if she reviewed the referral.    9:53 am: Fax to [place] went through. Left voicemail for admissions coordinator requesting she review.  10:56 am: Received call from [facility] admissions coordinator. She is having their clinical team review the referral. Garald Braver unable to take any new referrals at this time.  11:33 am: Called and updated daughter. She will notify the patient.  12:27 pm: Received call from [facility] admissions coordinator. She said in the last 24 hours, they have had 5 long-term patients test positive for COVID. They moved these patients to the rehab side to quarantine. They wanted the patient to be aware and still willing to admit before they reviewed the referral. Daughter will discuss with the patient and her siblings and notify CSW of decision.  2:22 pm: Daughter no longer wants [place]. [facility] called and said their clinical team is worried about her oxygen requirements. Patient is only on 3 L. Sent PT a message to see if she desatted during therapy at all today. Patient not on cpap or bipap either. CSW asked what their oxygen requirement standard was and admissions coordinator said 2 L. Daughter said [facility] admissions coordinator recommended [facility]. CSW spoke to admissions coordinator and faxed over referral.  4:27 pm: [facility] has offered a bed. Daughter aware and said they would like to accept the offer. MD will order COVID test. Cardiology wants to give IV Lasix 1-2 more days.  Expected  Discharge Plan: [place] Barriers to Discharge: Continued Medical Work up  Expected Discharge Plan and Services Expected Discharge Plan: [place] In-house Referral: NA Discharge Planning Services: NA Post Acute Care Choice: [place] arrangements for the past 2 months: [place], Single Family Home                 DME Arranged: N/A DME Agency: NA                   Social Determinants of Health (SDOH) Interventions    Readmission Risk Interventions Readmission Risk Prevention Plan 08/24/2019  Transportation Screening Complete  PCP or Specialist Appt within 3-5 Days Complete  HRI or [place] Complete  Social Work Consult for [place] Planning/Counseling Complete  Palliative Care Screening Not Applicable  Medication Review [employer]) Referral to Pharmacy

## 2019-08-28 NOTE — Progress Notes (Signed)
Progress Note  Patient Name: Destiny Zuniga Date of Encounter: 08/28/2019  Primary Cardiologist: [facility], Dr. Mariah MillingGollan consult; [facility]  Subjective   Continues to report SOB on 3L . Continues to c/o LEE and volume overload. Does not feel back at her baseline. No chest pain, racing HR, or palpitations. Had some trouble overnight with her catheter.   Inpatient Medications    Scheduled Meds: . apixaban  5 mg Oral BID  . Chlorhexidine Gluconate Cloth  6 each Topical Daily  . cholecalciferol  2,000 Units Oral Daily  . diltiazem  120 mg Oral Daily  . ferrous fumarate-b12-vitamic C-folic acid  1 capsule Oral BID PC  . fluticasone  2 spray Each Nare Daily  . furosemide  40 mg Intravenous Daily  . insulin aspart  0-9 Units Subcutaneous TID WC  . insulin detemir  14 Units Subcutaneous QHS  . isosorbide mononitrate  30 mg Oral BID  . levothyroxine  125 mcg Oral Daily  . loratadine  10 mg Oral Daily  . pantoprazole  40 mg Oral Daily  . senna  1 tablet Oral Daily  . sodium chloride flush  3 mL Intravenous Q12H  . umeclidinium-vilanterol  1 puff Inhalation Daily   Continuous Infusions: . sodium chloride Stopped (08/27/19 1042)   PRN Meds: sodium chloride, acetaminophen **OR** acetaminophen, albuterol, hydrALAZINE, hydrOXYzine, nitroGLYCERIN, ondansetron **OR** ondansetron (ZOFRAN) IV, oxyCODONE, polyethylene glycol, polyvinyl alcohol   Vital Signs    Vitals:   08/28/19 0408 08/28/19 0410 08/28/19 0500 08/28/19 0800  BP: (!) 134/48 (!) 127/59  (!) 151/60  Pulse: 63 69  70  Resp: (!) 24   18  Temp: 97.8 F (36.6 C)   98.8 F (37.1 C)  TempSrc: Oral   Oral  SpO2: 98%   94%  Weight:   103.1 kg   Height:        Intake/Output Summary (Last 24 hours) at 08/28/2019 1002 Last data filed at 08/28/2019 0411 Gross per 24 hour  Intake 110.55 ml  Output 2916 ml  Net -2805.45 ml   Last 3 Weights 08/28/2019 08/27/2019 08/26/2019  Weight (lbs) 227 lb 4.8 oz 227 lb  11.2 oz 226 lb 4.8 oz  Weight (kg) 103.103 kg 103.284 kg 102.649 kg      Telemetry    Atrial flutter with rates 60s-high 80s - Personally Reviewed  ECG    No new tracings- Personally Reviewed  Physical Exam   GEN: Obese female. Joined by granddaughter (asleep at this time). NAD.  Neck: JVD difficult to assess due to body habitus. Cardiac: irregular rhythm with controlled rate, no murmurs, rubs, or gallops.  Respiratory: Diffuse bilateral wheezing. GI: Obese, nontender, non-distended  MS: moderate to 1+ bilateral LEE; No deformity. Neuro:  Nonfocal  Psych: Normal affect   Labs    High Sensitivity Troponin:   Recent Labs  Lab 08/10/19 1140 08/20/19 1543 08/20/19 2056  TROPONINIHS 14 110* 85*      Cardiac EnzymesNo results for input(s): TROPONINI in the last 168 hours. No results for input(s): TROPIPOC in the last 168 hours.   Chemistry Recent Labs  Lab 08/24/19 0559  08/26/19 0535 08/27/19 0358 08/28/19 0534  NA 138   < > 136 138 138  K 4.0   < > 4.0 4.2 3.9  CL 105   < > 105 106 104  CO2 25   < > 23 25 27   GLUCOSE 105*   < > 150* 178* 186*  BUN 28*   < >  16 14 12   CREATININE 0.99   < > 0.90 1.07* 1.06*  CALCIUM 8.5*   < > 8.4* 8.6* 8.7*  PROT 5.4*  --  5.2*  --  5.8*  ALBUMIN 2.7*  --  2.6*  --  2.9*  AST 45*  --  30  --  25  ALT 139*  --  83*  --  59*  ALKPHOS 135*  --  125  --  142*  BILITOT 1.3*  --  1.3*  --  1.1  GFRNONAA 54*   < > >60 49* 50*  GFRAA >60   < > >60 57* 58*  ANIONGAP 8   < > 8 7 7    < > = values in this interval not displayed.     Hematology Recent Labs  Lab 08/26/19 0535 08/27/19 0358 08/28/19 0534  WBC 10.7* 10.7* 10.9*  RBC 3.68* 3.68* 3.97  HGB 9.6* 9.6* 10.3*  HCT 31.6* 33.0* 33.9*  MCV 85.9 89.7 85.4  MCH 26.1 26.1 25.9*  MCHC 30.4 29.1* 30.4  RDW 24.1* 24.7* 24.7*  PLT 245 256 292    BNP No results for input(s): BNP, PROBNP in the last 168 hours.   DDimer No results for input(s): DDIMER in the last 168 hours.    Radiology    No results found.  Cardiac Studies   08/26/19 ECHO:  1. Global right ventricle has moderately reduced systolic function.The right ventricular size is mildly enlarged. No increase in right ventricular wall thickness. 2. Right atrial size was moderately dilated. 3. Severely elevated pulmonary artery systolic pressure. 4. Left ventricular ejection fraction, by visual estimation, is 60 to 65%. The left ventricle has normal function. There is no left ventricular hypertrophy. 5. Left ventricular diastolic parameters are indeterminate. 6. Left atrial size was normal. 7. Tricuspid valve regurgitation moderate-severe. 8. The tricuspid regurgitant velocity is 4.09 m/s, and with an assumed right atrial pressure of 10 mmHg, the estimated right ventricular systolic pressure is severely elevated at 76.9 mmHg.  Patient Profile     78 y.o. female with a history of CAD, prior stenting to the LAD and LCx, HFpEF, pulmonary HTN, chronic hypoxic respiratory failure on [place] oxygen 3L, COPD, insulin-dependent diabetes, morbid obesity, recent hospitalization approximately 2 weeks ago for fall with L hip fracture s/p surgical repair 10/24/20and discharged on ASA and Plavix, and present to the hospital with AMS 08/20/19 and noted to be in Afib with RVR.  Assessment & Plan    New onset Afib/flutter with RVR --Intermittent sx when in Afib. Developed Afib ~1AM 11/5 per review of documentation. Reportedly bradycardic when in NSR. Telemetry shows atrial flutter. --CHA2DS2VASc score of at least  7 (CHF, HTN, agex2, DM2, vascular, female). --Continue Eliquis 5mg  BID. ASA and Plavix (for which she was taking s/p hip surgery) were stopped to minimize risk of bleeding.  --Continued on Cardizem CD.  --Echo as above LVEF normal 60-65%. --Once therapeutically anticoagulated, consider DCCV as an outpatient in 4 weeks if still in atrial flutter at follow-up given she is is intermittently sx.   HFpEF EF  60-65% Pulmonary Hypertension --SOB continues on 3L [place] oxygen. Unclear if on 2L or 3L at home. Reports she feels as if she is holding on to fluid this AM.  --Echo as above with global reduction of RV, mild RVE, moderate RAE. Severe pulmonary HTN noted at 78mmHg. Consider also that moderate to severe TR could contribute to SOB.  --Still volume up on exam. --Continue IV Lasix 40mg  daily  this AM by IM. Continue gentle diuresis with close monitoring of renal function then transition to oral diuretic before discharge.  --Net -9.83L for admission and -2.8L over the last 24h. Wt 104.5kg  103.3kg  103.1kg. Renal function stable 1.07  1.06. BUN 14  12.  --Consider follow-up in the pulmonary hypertension clinic.    CKDIII --Nephrology consulted.   Anemia --Likely multifactorial in the setting of acute blood loss s/p surgery as well as likely anemia of chronic dz. S/p hip fracture and repair. As above, ASA and Plavix stopped with start of Eliquis. Current Hgb stable at 9.6.  Prior mechanical fall versus syncope --Admitted 07/2019 with fall versus syncope with subsequent hip fx requiring surgical repair. Etiology of all unknown.  --Cannot exclude post-termination pause given her recent diagnosis of Afib/flutter; however, the 48h monitor reportedly did not show any signs of arrhythmia.  --No significant pauses or arrhythmias on telemetry though bradycardic at admission. --As an outpatient, could discuss further ischemic evaluation given known CAD with mild troponin elevation and per patient's primary cardiologist via [facility].   Elevated HS Tn without CP History of CAD involving the native coronary arteries --No current CP. --HS Tn minimally elevated at 110 and not consistent with ACS. Likely supply demand ischemia in the setting of ARF, anemia, RVR.  --Echo as above. Normal EF. --ASA and Plavix discontinued with initiation of Eliquis as above.  --Continue PTA imdur. --Statin on hold given elevated  liver enzymes at admission.  --As above, would benefit from further ischemic evaluation with timing to be determined by primary cardiologist.   HTN --Continue IV lasix. Losartan on hold due to AKI. Continue Imdur and Cardizem. Continue PRN hydralazine.  Transaminitis --Statin on hold and can likely restart at discharge given improving ALT with follow-up labs to monitor.  For questions or updates, please contact [facility] Please consult [email or web address] for contact info under        Signed, Lennon Alstrom, PA-C  08/28/2019, 10:02 AM

## 2019-08-28 NOTE — Progress Notes (Signed)
Physical Therapy Treatment Patient Details Name: Destiny Zuniga MRN: [id number] DOB: 04-21-1941 Today's Date: 08/28/2019    History of Present Illness Patient is a 78 year old female admitted from SNF with AMS, acute renal failure. S/P hip fx and ORIF 08/11/19. PMH to include: COPD, Home O2, DM, HLD, CAD, CHF, HTN.    PT Comments    Pt ready for session.  In and out of bed with mod/max a x 2 and increased time.  She asks for extended sitting time and EOB before standing and returning to supine.  She is able to increase standing time x 5 minutes today with 2 x 5 marching LLE.  She prefers to stand at right side of bed for support of counter vs RW. She continues to voice fear of falling which limits her as much as her pain.  Encouragement given.   Follow Up Recommendations  SNF     Equipment Recommendations       Recommendations for Other Services       Precautions / Restrictions Precautions Precautions: Fall Restrictions Weight Bearing Restrictions: Yes RLE Weight Bearing: Weight bearing as tolerated LLE Weight Bearing: Partial weight bearing LLE Partial Weight Bearing Percentage or Pounds: 50 Other Position/Activity Restrictions: Per PA-C Cameron Proud via phone call 08/24/19 pt to be 50% PWB on the LLE until 08/31/19 at which time she may be WBAT    Mobility  Bed Mobility Overal bed mobility: Needs Assistance Bed Mobility: Supine to Sit;Sit to Supine Rolling: Max assist;+2 for physical assistance   Supine to sit: Max assist;+2 for physical assistance;Mod assist Sit to supine: +2 for physical assistance;Max assist;Mod assist      Transfers Overall transfer level: Needs assistance   Transfers: Sit to/from Stand Sit to Stand: +2 physical assistance;Mod assist         General transfer comment: Pt used BUEs to pull up from the sink and leaned on the sink counter for support in standing  Ambulation/Gait             General Gait Details: Pt able to march  with LLE 2 x 5 but unable to amb this session   Stairs             Wheelchair Mobility    Modified Rankin (Stroke Patients Only)       Balance Overall balance assessment: Needs assistance Sitting-balance support: Feet supported;Single extremity supported Sitting balance-Leahy Scale: Good     Standing balance support: Bilateral upper extremity supported Standing balance-Leahy Scale: Poor Standing balance comment: heavy lean on counter for support                            Cognition Arousal/Alertness: Awake/alert Behavior During Therapy: WFL for tasks assessed/performed Overall Cognitive Status: Within Functional Limits for tasks assessed                                        Exercises      General Comments        Pertinent Vitals/Pain Pain Assessment: Faces Faces Pain Scale: Hurts even more Pain Location: L hip/LE - with mobility Pain Descriptors / Indicators: Guarding;Discomfort;Sore Pain Intervention(s): Limited activity within patient's tolerance;Monitored during session    Home Living                      Prior Function  PT Goals (current goals can now be found in the care plan section) Progress towards PT goals: Progressing toward goals    Frequency    7X/week      PT Plan Current plan remains appropriate    Co-evaluation              AM-PAC PT "6 Clicks" Mobility   Outcome Measure  Help needed turning from your back to your side while in a flat bed without using bedrails?: A Lot Help needed moving from lying on your back to sitting on the side of a flat bed without using bedrails?: A Lot Help needed moving to and from a bed to a chair (including a wheelchair)?: A Lot Help needed standing up from a chair using your arms (e.g., wheelchair or bedside chair)?: A Lot Help needed to walk in hospital room?: Total Help needed climbing 3-5 steps with a railing? : Total 6 Click Score:  10    End of Session Equipment Utilized During Treatment: Gait belt;Oxygen Activity Tolerance: Patient tolerated treatment well Patient left: in bed;with call bell/phone within reach;with bed alarm set Nurse Communication: Mobility status Pain - Right/Left: Left Pain - part of body: Hip     Time: 0962-8366 PT Time Calculation (min) (ACUTE ONLY): 23 min  Charges:  $Therapeutic Exercise: 8-22 mins $Therapeutic Activity: 8-22 mins                    Danielle Dess, PTA 08/28/19, 11:23 AM

## 2019-08-28 NOTE — Progress Notes (Signed)
PROGRESS NOTE    Destiny Zuniga  TIR:[id number] DOB: 08/07/1941 DOA: 08/20/2019 PCP: Kirk Ruths, MD    Brief Narrative:   Destiny Zuniga is an 78 y.o. female with history significant for COPD, type II DM, pulmonary hypertension, tricuspid regurgitation, CHF, recent left hip fracture was brought to the hospital because of altered mental status.  She was found to have acute kidney injury with creatinine of 5.21.  Baseline creatinine is around 1.15.  Interim History: 1. Cre is improving 5.21 -->4.47-->2.34 -->1.4-->0.99-->0.96 -->1.07 today. US-renal has no hydronephrosis 2. US-liver: Normal liver Doppler ultrasound.  Liver function improvement 3. Has left hip pain which is likely from recent left hip surgery (ORIF) 4. CT head is negative for acute issues 10/5. 5. 11/6: Pt developed new onset A fib with RVR 11/6, card was consulted, started Eliquis, stopped ASA and plavix, will get 2d echo. 6. Dr. Leighton Roach saw pt 11/7. Stop Dilt IV and changed to PO.  7. 11/8: HR 60-70s, per Dr. Percival Spanish, [place] to stop the Coreg, continue Cardizem and change to carizem CD.   8. 11/8, 2D echo showed:   1. Global right ventricle has moderately reduced systolic function.The right ventricular size is mildly enlarged. No increase in right ventricular wall thickness.   2. Right atrial size was moderately dilated.   3. Severely elevated pulmonary artery systolic pressure.   4. Left ventricular ejection fraction, by visual estimation, is 60 to 65%. The left ventricle has normal function. There is no left ventricular hypertrophy.   5. Left ventricular diastolic parameters are indeterminate.   6. Left atrial size was normal.   7. Tricuspid valve regurgitation moderate-severe.   8. The tricuspid regurgitant velocity is 4.09 m/s, and with an assumed right atrial pressure of 10 mmHg, the estimated right ventricular systolic pressure is severely elevated at 76.9 mmHg.  9.  Develop  urinary retention -->placed Foley cath again 10. 11/9 daughter report pt has worse anxiety in night. Increased her hydroxyzine dose from 5 to 10 mg as needed 11. 11/10 In/Out: -2805 ml   Assessment & Plan:   Principal Problem:   New onset atrial fibrillation with RVR Active Problems:   Hip fracture (HCC)   COPD (chronic obstructive pulmonary disease) (HCC)   Type II diabetes mellitus with renal manifestations (HCC)   Hyperlipidemia   Hyperkalemia   Coronary artery disease   Chronic diastolic CHF (congestive heart failure) (HCC)   Hypothyroidism   Hypertension associated with diabetes (HCC)   Elevated liver enzymes   Acute renal failure superimposed on stage 3a chronic kidney disease (HCC)   Altered mental status   Acute on chronic right-sided heart failure ([place])   Pulmonary hypertension, unspecified ([place])   New onset Afib with RVR:  Patient developed Afib with RVR.  HR up to 130s. Now is better controlled now. She had bradycardia in thisadmission. 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 CHADS2VASc is 7, Nee anticoagulants. TSH normal on 08/20/2019. Cardiology was consulted, Dr. Idolina Primer evaluated pt 11/6 --> started eliquis. Since patient's heart rate is better controlled, switched IV Cardizem to oral med.  11/8: HR 60-70s, per Dr. Percival Spanish, [place] to stop the Coreg, continue Cardizem and change to CD.  2d echo on 11/8 showed EF 60 to 65%, TR, Pulmonary HTN, mild RV dysfunction. Per Dr. Percival Spanish, will needs follow up and possible work up in the future.  11/9: HR is 60-70s.  11/10: HR 60-70s  - Highly appreciate card consultation, will follow up recommendations. -  switched to diltiazem CD 120 mg daily - d/c'ed coreg 11/8 - may need to see EP for tachy-brady syndrome  - Eliquis 5 mg bid  - No recent stenting, therefore stopped ASA and Plavix to minimize her bleeding risk, especially with her underlying anemia   Acute renal failure superimposed on stage 3a chronic kidney disease ([place]):  improving. Cre is improving 5.21 -->4.47-->2.34 -->0.99-->0.96-->1.07 --> 1.06  today. US-renal has no hydronephrosis. Consulted nephrologist to assist with management. Per Dr. Holley Raring, no urgent indication for dialysis at the moment. - continue to hold cozarr -avoid NSAIDS.  Hyperkalemia: resovlve. K 3.9 today. Mg 1.8 -give 1 g of magnesium sulfate  Hyponatremia: resolved.    Altered mental status/metabolic encephalopathy: resoved, oriented x 3. CT-head negative. Ammonia level normal 18. Pt has anxiety, worse at night -Frequent neuro check -increased hydroxyzine dose from 5 to 10 mg prn  Leukocytosis: no fever. Improving. 12.1-->11.8 -->9.6 -->10.6 --> 10.7 -->10.7 -->10.7 -->10.9. No clear evidence of infection at this time. This may be reactive. -f/u by CBC -Follow-up cultures -->so far negative bx and Ux  Chronic diastolic CHF: Lasix has been held.  2D echo on 10/05/2018 showed EF of 60 to 65%.  Patient has leg edema.  She has mild shortness of breath. Leg edema has worsened. Today, pt has 2+ leg edema. -low dose of oral lasix 20 mg daily -->increased to 40 mg daily -monitoring renal Fx  CAD status post PCI: no chest pain -Continue  Imdur  - d/c'ed ASA and plavis due to newly started Eliquis -Hold simvastatin because of elevated liver enzymes.    Elevated troponins: Trop 14, 110, 85. This is probably from demand ischemia/acute kidney injury.  No complaints of chest pain or shortness of breath.  Elevated liver enzymes: improving. Etiology unclear. Liver US is negative. Hepatitis panel negative.  Improving.   Hepatic Function Latest Ref Rng & Units 08/28/2019 08/26/2019 08/24/2019  Total Protein 6.5 - 8.1 g/dL 5.8(L) 5.2(L) 5.4(L)  Albumin 3.5 - 5.0 g/dL 2.9(L) 2.6(L) 2.7(L)  AST 15 - 41 U/L 25 30 45(H)  ALT 0 - 44 U/L 59(H) 83(H) 139(H)  Alk Phosphatase 38 - 126 U/L 142(H) 125 135(H)  Total Bilirubin 0.3 - 1.2 mg/dL 1.1 1.3(H) 1.3(H)  Bilirubin, Direct 0.0 - 0.2 mg/dL - - -     Hypertension: losartan have been held.  -prn oral hydralazine orally -on Cardizme and lasix    Type II diabetes mellitus with renal manifestations:  -Continue Levemir -SSI    COPD and chronic hypoxemic respiratory failure: stable -Continue bronchodilators and oxygen via nasal cannula  Recent left hip fracture status post ORIF on 08/11/2019: -Prn oxycodone -11/8 consulted ortho, Dr. Roland Rack, for removing staples,  "I have reviewed the patient's history and recent lab work, and have evaluated the patient as well.  I agree with the assessment of Reche Dixon, PA-C, regarding her wounds.  The staples in her distal incision can be removed today, but the other staples should remain in place for a few more days.  She certainly may be discharged today if she is cleared from a medical standpoint, and return to the office toward the end of the week for staple removal". -highly appreciate Dr. Nicholaus Bloom Consultation.    DVT prophylaxis: on Eliquis Code Status: Full code amily Communication: called her daughter Disposition Plan: Discharge to SNF in 1 to 2 days, pending bed availability. Card wants to diurese pt for 1 or 2 more days. Per CM, SNF in [place] will take  patient.  COVID-19 ordered today 11/10. Barriers for discharge:   Consultants:   renal  Procedures:  US-renal  US-liver  Antimicrobials:    Subjective:   Has mild SOB, no CP, no nausea, vomiting, abdominal pain or diarrhea.  No fever or chills. Has leg edema bilaterally.  Objective: Vitals:   08/28/19 0410 08/28/19 0500 08/28/19 0800 08/28/19 1523  BP: (!) 127/59  (!) 151/60 (!) 128/51  Pulse: 69  70 68  Resp:   18 18  Temp:   98.8 F (37.1 C) 97.8 F (36.6 C)  TempSrc:   Oral   SpO2:   94% 99%  Weight:  103.1 kg    Height:        Intake/Output Summary (Last 24 hours) at 08/28/2019 1819 Last data filed at 08/28/2019 1648 Gross per 24 hour  Intake -  Output 2291 ml  Net -2291 ml   Filed Weights    08/26/19 0610 08/27/19 0407 08/28/19 0500  Weight: 102.6 kg 103.3 kg 103.1 kg    Examination: Physical Exam:  General: Not in acute distress HEENT: PERRL, EOMI, no scleral icterus, No JVD or bruit Cardiac: S1/S2, RRR, No murmurs, gallops or rubs Pulm: Clear to auscultation bilaterally. No rales, wheezing, rhonchi or rubs. Abd: Soft, nondistended, nontender, no rebound pain, no organomegaly, BS present Ext: 2+ leg edema bilaterally. 2+DP/PT pulse bilaterally Musculoskeletal: No joint deformities, erythema, or stiffness, ROM full Skin: No rashes.  Neuro: Intermittently confused, currently oriented X3, cranial nerves II-XII grossly intact, moves all extremities.  Psych: Patient is not psychotic, no suicidal or hemocidal ideation.    Data Reviewed: I have personally reviewed following labs and imaging studies  CBC: Recent Labs  Lab 08/23/19 0432 08/24/19 0559 08/25/19 0450 08/26/19 0535 08/27/19 0358 08/28/19 0534  WBC 9.6 10.9* 10.7* 10.7* 10.7* 10.9*  NEUTROABS 7.0  --   --   --   --   --   HGB 8.4* 9.3* 9.5* 9.6* 9.6* 10.3*  HCT 27.8* 31.9* 31.9* 31.6* 33.0* 33.9*  MCV 84.0 87.2 86.7 85.9 89.7 85.4  PLT 225 258 250 245 256 117   Basic Metabolic Panel: Recent Labs  Lab 08/23/19 0432 08/24/19 0559 08/25/19 0450 08/26/19 0535 08/27/19 0358 08/28/19 0534  NA 137 138 139 136 138 138  K 4.0 4.0 4.2 4.0 4.2 3.9  CL 105 105 107 105 106 104  CO2 '23 25 24 23 25 27  ' GLUCOSE 212* 105* 104* 150* 178* 186*  BUN 50* 28* '20 16 14 12  ' CREATININE 1.40* 0.99 0.96 0.90 1.07* 1.06*  CALCIUM 8.3* 8.5* 8.8* 8.4* 8.6* 8.7*  MG 2.1  --  1.8 2.1 1.8 1.8   GFR: Estimated Creatinine Clearance: 48.4 mL/min (A) (by C-G formula based on SCr of 1.06 mg/dL (H)). Liver Function Tests: Recent Labs  Lab 08/23/19 0432 08/24/19 0559 08/26/19 0535 08/28/19 0534  AST 47* 45* 30 25  ALT 174* 139* 83* 59*  ALKPHOS 123 135* 125 142*  BILITOT 1.5* 1.3* 1.3* 1.1  PROT 5.0* 5.4* 5.2* 5.8*   ALBUMIN 2.4* 2.7* 2.6* 2.9*   No results for input(s): LIPASE, AMYLASE in the last 168 hours. Recent Labs  Lab 08/22/19 1829  AMMONIA 18   Coagulation Profile: No results for input(s): INR, PROTIME in the last 168 hours. Cardiac Enzymes: No results for input(s): CKTOTAL, CKMB, CKMBINDEX, TROPONINI in the last 168 hours. BNP (last 3 results) No results for input(s): PROBNP in the last 8760 hours. HbA1C: No results for input(s): HGBA1C  in the last 72 hours. CBG: Recent Labs  Lab 08/27/19 1658 08/27/19 2119 08/28/19 0802 08/28/19 1157 08/28/19 1645  GLUCAP 255* 221* 170* 259* 157*   Lipid Profile: No results for input(s): CHOL, HDL, LDLCALC, TRIG, CHOLHDL, LDLDIRECT in the last 72 hours. Thyroid Function Tests: No results for input(s): TSH, T4TOTAL, FREET4, T3FREE, THYROIDAB in the last 72 hours. Anemia Panel: No results for input(s): VITAMINB12, FOLATE, FERRITIN, TIBC, IRON, RETICCTPCT in the last 72 hours. Sepsis Labs: No results for input(s): PROCALCITON, LATICACIDVEN in the last 168 hours.  Recent Results (from the past 240 hour(s))  Urine culture     Status: None   Collection Time: 08/20/19  5:19 PM   Specimen: In/Out Cath Urine  Result Value Ref Range Status   Specimen Description   Final    IN/OUT CATH URINE Performed at [facility], [address] [place], [place] [address]    Special Requests   Final    NONE Performed at [facility], [address] [place], [place] [address]    Culture   Final    NO GROWTH Performed at [facility], [place] [address], [place], [place] [address]    Report Status 08/21/2019 FINAL  Final  Culture, blood (routine x 2)     Status: None   Collection Time: 08/20/19  5:21 PM   Specimen: BLOOD  Result Value Ref Range Status   Specimen Description BLOOD RIGHT ANTECUBITAL  Final   Special Requests   Final    BOTTLES DRAWN AEROBIC AND ANAEROBIC Blood Culture adequate volume   Culture   Final    NO  GROWTH 8 DAYS Performed at [facility], [address], [place], [place] [address]    Report Status 08/28/2019 FINAL  Final  SARS Coronavirus 2 by RT PCR (hospital order, performed in [place] hospital lab) Nasopharyngeal Nasopharyngeal Swab     Status: None   Collection Time: 08/20/19  5:47 PM   Specimen: Nasopharyngeal Swab  Result Value Ref Range Status   SARS Coronavirus 2 NEGATIVE NEGATIVE Final    Comment: (NOTE) If result is NEGATIVE SARS-CoV-2 target nucleic acids are NOT DETECTED. The SARS-CoV-2 RNA is generally detectable in upper and lower  respiratory specimens during the acute phase of infection. The lowest  concentration of SARS-CoV-2 viral copies this assay can detect is 250  copies / mL. A negative result does not preclude SARS-CoV-2 infection  and should not be used as the sole basis for treatment or other  patient management decisions.  A negative result may occur with  improper specimen collection / handling, submission of specimen other  than nasopharyngeal swab, presence of viral mutation(s) within the  areas targeted by this assay, and inadequate number of viral copies  (<250 copies / mL). A negative result must be combined with clinical  observations, patient history, and epidemiological information. If result is POSITIVE SARS-CoV-2 target nucleic acids are DETECTED. The SARS-CoV-2 RNA is generally detectable in upper and lower  respiratory specimens dur ing the acute phase of infection.  Positive  results are indicative of active infection with SARS-CoV-2.  Clinical  correlation with patient history and other diagnostic information is  necessary to determine patient infection status.  Positive results do  not rule out bacterial infection or co-infection with other viruses. If result is PRESUMPTIVE POSTIVE SARS-CoV-2 nucleic acids MAY BE PRESENT.   A presumptive positive result was obtained on the submitted specimen  and confirmed on repeat  testing.  While 2019 novel coronavirus  (SARS-CoV-2) nucleic acids may be present in the submitted sample  additional confirmatory testing may be necessary for epidemiological  and / or clinical management purposes  to differentiate between  SARS-CoV-2 and other Sarbecovirus currently known to infect humans.  If clinically indicated additional testing with an alternate test  methodology [phone]) is advised. The SARS-CoV-2 RNA is generally  detectable in upper and lower respiratory sp ecimens during the acute  phase of infection. The expected result is Negative. Fact Sheet for Patients:  StrictlyIdeas.no Fact Sheet for Healthcare Providers: BankingDealers.co.za This test is not yet approved or cleared by the [place] FDA and has been authorized for detection and/or diagnosis of SARS-CoV-2 by FDA under an Emergency Use Authorization (EUA).  This EUA will remain in effect (meaning this test can be used) for the duration of the COVID-19 declaration under Section 564(b)(1) of the Act, 21 U.S.C. section 360bbb-3(b)(1), unless the authorization is terminated or revoked sooner. Performed at [facility], [address] [place], [place] [address]       Radiology Studies: No results found.      Scheduled Meds: . apixaban  5 mg Oral BID  . Chlorhexidine Gluconate Cloth  6 each Topical Daily  . cholecalciferol  2,000 Units Oral Daily  . diltiazem  120 mg Oral Daily  . ferrous [id number] C-folic acid  1 capsule Oral BID PC  . fluticasone  2 spray Each Nare Daily  . furosemide  40 mg Intravenous Daily  . insulin aspart  0-9 Units Subcutaneous TID WC  . insulin detemir  14 Units Subcutaneous QHS  . isosorbide mononitrate  30 mg Oral BID  . levothyroxine  125 mcg Oral Daily  . loratadine  10 mg Oral Daily  . pantoprazole  40 mg Oral Daily  . senna  1 tablet Oral Daily  . sodium chloride flush  3 mL Intravenous  Q12H  . umeclidinium-vilanterol  1 puff Inhalation Daily   Continuous Infusions: . sodium chloride Stopped (08/27/19 1042)  . magnesium sulfate bolus IVPB 1 g (08/28/19 1756)     LOS: 8 days    Time spent: 30 min    Ivor Costa, DO [facility] PAGER is on [place]  If 7PM-7AM, please contact night-coverage [email or web address] Password [facility] 08/28/2019, 6:19 PM

## 2019-08-29 LAB — GLUCOSE, CAPILLARY
Glucose-Capillary: 142 mg/dL — ABNORMAL HIGH (ref 70–99)
Glucose-Capillary: 304 mg/dL — ABNORMAL HIGH (ref 70–99)
Glucose-Capillary: 262 mg/dL — ABNORMAL HIGH (ref 70–99)

## 2019-08-29 LAB — COMPREHENSIVE METABOLIC PANEL
ALT: 49 U/L — ABNORMAL HIGH (ref 0–44)
AST: 24 U/L (ref 15–41)
Albumin: 2.9 g/dL — ABNORMAL LOW (ref 3.5–5.0)
Alkaline Phosphatase: 137 U/L — ABNORMAL HIGH (ref 38–126)
Anion gap: 8 (ref 5–15)
BUN: 11 mg/dL (ref 8–23)
CO2: 27 mmol/L (ref 22–32)
Calcium: 8.9 mg/dL (ref 8.9–10.3)
Chloride: 103 mmol/L (ref 98–111)
Creatinine, Ser: 0.86 mg/dL (ref 0.44–1.00)
GFR calc Af Amer: >60 mL/min (ref >60)
GFR calc non Af Amer: >60 mL/min (ref >60)
Glucose, Bld: 174 mg/dL — ABNORMAL HIGH (ref 70–99)
Potassium: 3.8 mmol/L (ref 3.5–5.1)
Sodium: 138 mmol/L (ref 135–145)
Total Bilirubin: 1.1 mg/dL (ref 0.3–1.2)
Total Protein: 5.8 g/dL — ABNORMAL LOW (ref 6.5–8.1)

## 2019-08-29 LAB — CBC
HCT: 36.1 % (ref 36.0–46.0)
Hemoglobin: 10.9 g/dL — ABNORMAL LOW (ref 12.0–15.0)
MCH: 26.2 pg (ref 26.0–34.0)
MCHC: 30.2 g/dL (ref 30.0–36.0)
MCV: 86.8 fL (ref 80.0–100.0)
Platelets: 312 10*3/uL (ref 150–400)
RBC: 4.16 MIL/uL (ref 3.87–5.11)
RDW: 24.4 % — ABNORMAL HIGH (ref 11.5–15.5)
WBC: 10.2 10*3/uL (ref 4.0–10.5)
nRBC: 0 % (ref 0.0–0.2)

## 2019-08-29 LAB — SARS CORONAVIRUS 2 (TAT 6-24 HRS): SARS Coronavirus 2: NEGATIVE

## 2019-08-29 LAB — MAGNESIUM: Magnesium: 1.8 mg/dL (ref 1.7–2.4)

## 2019-08-29 MED ORDER — OXYCODONE HCL 5 MG PO TABS: 5.0000 mg | ORAL_TABLET | Freq: Four times a day (QID) | ORAL | 0 refills | Status: AC | PRN

## 2019-08-29 MED ORDER — DILTIAZEM HCL ER COATED BEADS 120 MG PO CP24: 120.0000 mg | ORAL_CAPSULE | Freq: Every day | ORAL | 0 refills | Status: AC

## 2019-08-29 MED ORDER — OXYCODONE HCL 5 MG PO TABS
5.0000 mg | ORAL_TABLET | Freq: Once | ORAL | Status: AC
  Administered 2019-08-29: 5 mg via ORAL
  Filled 2019-08-29: qty 1

## 2019-08-29 MED ORDER — POLYETHYLENE GLYCOL 3350 17 G PO PACK: 17.0000 g | PACK | Freq: Every day | ORAL | 0 refills | Status: AC | PRN

## 2019-08-29 MED ORDER — INSULIN ASPART 100 UNIT/ML ~~LOC~~ SOLN: 8.0000 [IU] | Freq: Three times a day (TID) | SUBCUTANEOUS | 11 refills | Status: AC

## 2019-08-29 MED ORDER — APIXABAN 5 MG PO TABS: 5.0000 mg | ORAL_TABLET | Freq: Two times a day (BID) | ORAL | 0 refills | Status: AC

## 2019-08-29 NOTE — Progress Notes (Signed)
16 staples removed from Left hip incision/ edges approximated/ steri strips applied/ tolerated well

## 2019-08-29 NOTE — Progress Notes (Signed)
Discharge report called to [place] / iv and tele removed/ pt will be discharged with foley per MD order/ EMS called to transport

## 2019-08-29 NOTE — TOC Progression Note (Signed)
Transition of Care [facility]) - Progression Note    Patient Details  Name: Destiny Zuniga MRN: [id number] Date of Birth: [birth year]  Transition of Care [facility]) CM/SW [place], LCSW Phone Number: 08/29/2019, 12:43 PM  Clinical Narrative: CSW met with patient to provide update. COVID test from yesterday came back negative. Results are good through tomorrow.    Expected Discharge Plan: [place] Barriers to Discharge: Continued Medical Work up  Expected Discharge Plan and Services Expected Discharge Plan: [place] In-house Referral: NA Discharge Planning Services: NA Post Acute Care Choice: [place] arrangements for the past 2 months: [place], Single Family Home                 DME Arranged: N/A DME Agency: NA                   Social Determinants of Health (SDOH) Interventions    Readmission Risk Interventions Readmission Risk Prevention Plan 08/24/2019  Transportation Screening Complete  PCP or Specialist Appt within 3-5 Days Complete  HRI or [place] Complete  Social Work Consult for [place] Planning/Counseling Complete  Palliative Care Screening Not Applicable  Medication Review Press photographer) Referral to Pharmacy

## 2019-08-29 NOTE — TOC Transition Note (Addendum)
Transition of Care [facility]) - CM/SW Discharge Note   Patient Details  Name: Destiny Zuniga MRN: [id number] Date of Birth: 1941/01/29  Transition of Care [facility]) CM/SW Contact:  Candie Chroman, LCSW Phone Number: 08/29/2019, 2:00 PM   Clinical Narrative: Patient has orders to discharge to [facility]F today. RN will call report to [phone] (Ask for [facility] [place]) prior to setting up transport. No further concerns. CSW signing off.    2:30 pm: EMS told RN it was too far for them to transport her. [facility] will pick her up after 5:00.  Final next level of care: Skilled Nursing Facility Barriers to Discharge: Barriers Resolved   Patient Goals and CMS Choice Patient states their goals for this hospitalization and ongoing recovery are:: To return to SNF to continue with therapy. CMS Medicare.gov Compare Post Acute Care list provided to:: Patient Choice offered to / list presented to : Patient, Adult Children  Discharge Placement   Existing PASRR number confirmed : 08/24/19          Patient chooses bed at: [facility] Patient to be transferred to facility by: EMS Name of family member notified: Lattie Haw [place] Patient and family notified of of transfer: 08/29/19  Discharge Plan and Services In-house Referral: NA Discharge Planning Services: NA Post Acute Care Choice: [place]          DME Arranged: N/A DME Agency: NA                  Social Determinants of Health ([place]) Interventions     Readmission Risk Interventions Readmission Risk Prevention Plan 08/24/2019  Transportation Screening Complete  PCP or Specialist Appt within 3-5 Days Complete  HRI or [place] Complete  Social Work Consult for [place] Planning/Counseling Complete  Palliative Care Screening Not Applicable  Medication Review Press photographer) Referral to Pharmacy

## 2019-08-29 NOTE — Discharge Summary (Signed)
[facility] [place] [facility]   PATIENT NAME: Destiny PressmanMiriam Godwin Zuniga    MR#:  [id number]  DATE OF BIRTH:  1941/02/06  DATE OF ADMISSION:  08/20/2019 ADMITTING PHYSICIAN: Charlsie QuestVishal R Patel, MD  DATE OF DISCHARGE: 08/29/19  PRIMARY CARE PHYSICIAN: Lauro RegulusAnderson, Marshall W, MD    ADMISSION DIAGNOSIS:  Hyperkalemia [E87.5] Elevated liver enzymes [R74.8] Acute renal failure (ARF) (HCC) [N17.9] Acute renal failure, unspecified acute renal failure type (HCC) [N17.9] Altered mental status, unspecified altered mental status type [R41.82]  DISCHARGE DIAGNOSIS:  Principal Problem:   New onset atrial fibrillation with RVR Active Problems:   Hip fracture (HCC)   COPD (chronic obstructive pulmonary disease) (HCC)   Type II diabetes mellitus with renal manifestations (HCC)   Hyperlipidemia   Hyperkalemia   Coronary artery disease   Chronic diastolic CHF (congestive heart failure) (HCC)   Hypothyroidism   Hypertension associated with diabetes (HCC)   Elevated liver enzymes   Acute renal failure superimposed on stage 3a chronic kidney disease (HCC)   Altered mental status   Acute on chronic right-sided heart failure (HCC)   Pulmonary hypertension, unspecified (HCC)   SECONDARY DIAGNOSIS:   Past Medical History:  Diagnosis Date  . Arthritis   . CHF (congestive heart failure) (HCC)   . COPD (chronic obstructive pulmonary disease) (HCC)   . Diabetes mellitus without complication (HCC)   . Pulmonary hypertension (HCC)   . Tricuspid regurgitation     HOSPITAL COURSE:   1.  New onset atrial fibrillation with rapid ventricular response.  The patient's heart rate went up into the 130s.  She did have bradycardia on this admission also.  Seen by cardiology.  Patient was started on Eliquis for anticoagulation.  Patient on oral Cardizem for rate control.  Coreg was stopped by cardiology.  May end up needing to follow-up for EP studies as outpatient.  [place]. 2.  Acute kidney injury on chronic kidney disease stage II.  Creatinine was as high as 5.21 on admission.  Creatinine down to 0.86. 3.  Hyperkalemia with the acute kidney injury.  Potassium now in the normal range. 4.  Hyponatremia.  This has resolved 5.  Acute metabolic encephalopathy.  This has improved. 6.  Chronic diastolic congestive heart failure.  Patient seen by cardiology they recommended IV Lasix for a few days.  Switch back to oral Lasix 20 mg twice daily. 7.  History of CAD.  Aspirin and Plavix was stopped secondary to recently starting Eliquis.  Restart simvastatin 8.  Slightly elevated troponin secondary to demand ischemia and acute kidney injury. 9.  Elevated liver enzymes.  Hepatitis profile negative.  Liver ultrasound negative.  Liver enzymes improved.  Can go back on simvastatin. 10.  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  Continue 2 L nasal cannula.  Continue inhalers. 11.  Hypertension on Cardizem and Lasix 12.  Type 2 diabetes mellitus with chronic kidney disease.  Patient on Levemir and short acting insulin prior to meals.  Check fingersticks before every meal and nightly. 13.  Recent left hip fracture status post ORIF on 08/11/2019.  Physical therapy as tolerated.  Follow-up with orthopedic surgery.  Removing rest of staples today. 14.  Urinary retention.  Would do a voiding trial tomorrow or the following day.  If she cannot pass the voiding trial then will have to follow-up with urology as outpatient.  DISCHARGE CONDITIONS:   Satisfactory  CONSULTS OBTAINED:  Treatment Team:  Kennedy BuckerMenz, Michael, MD  Antonieta Iba, MD  DRUG ALLERGIES:   Allergies  Allergen Reactions  . Codeine Anaphylaxis  . Midazolam Other (See Comments)    Altered mental status    . Celecoxib Other (See Comments)    Excessive bleeding   . Lisinopril Cough    DISCHARGE MEDICATIONS:   Allergies as of 08/29/2019      Reactions   Codeine  Anaphylaxis   Midazolam Other (See Comments)   Altered mental status    Celecoxib Other (See Comments)   Excessive bleeding   Lisinopril Cough      Medication List    STOP taking these medications   aspirin 325 MG tablet   carvedilol 3.125 MG tablet Commonly known as: COREG   clopidogrel 75 MG tablet Commonly known as: PLAVIX   diclofenac sodium 1 % Gel Commonly known as: VOLTAREN   gabapentin 600 MG tablet Commonly known as: NEURONTIN   losartan 100 MG tablet Commonly known as: COZAAR   potassium chloride 10 MEQ tablet Commonly known as: KLOR-CON   traMADol 50 MG tablet Commonly known as: ULTRAM     TAKE these medications   albuterol 108 (90 Base) MCG/ACT inhaler Commonly known as: VENTOLIN HFA Inhale 2 puffs into the lungs every 6 (six) hours as needed for wheezing or shortness of breath.   apixaban 5 MG Tabs tablet Commonly known as: ELIQUIS Take 1 tablet (5 mg total) by mouth 2 (two) times daily.   cetirizine 10 MG tablet Commonly known as: ZYRTEC Take 10 mg by mouth daily as needed for allergies.   cholecalciferol 25 MCG (1000 UT) tablet Commonly known as: VITAMIN D3 Take 2,000 Units by mouth daily.   diltiazem 120 MG 24 hr capsule Commonly known as: CARDIZEM CD Take 1 capsule (120 mg total) by mouth daily. Start taking on: August 30, 2019   ferrous fumarate-b12-vitamic C-folic acid capsule Commonly known as: TRINSICON / FOLTRIN Take 1 capsule by mouth 2 (two) times daily after a meal.   fluticasone 50 MCG/ACT nasal spray Commonly known as: FLONASE Place 2 sprays into both nostrils daily.   furosemide 20 MG tablet Commonly known as: LASIX Take 1 tablet (20 mg total) by mouth 2 (two) times daily. What changed:   medication strength  how much to take   insulin aspart 100 UNIT/ML injection Commonly known as: NovoLOG Inject 8 Units into the skin 3 (three) times daily with meals. What changed: how much to take   Insulin Detemir 100  UNIT/ML Pen Commonly known as: LEVEMIR Inject 14 Units into the skin daily with supper. What changed: how much to take   isosorbide mononitrate 30 MG 24 hr tablet Commonly known as: IMDUR Take 30 mg by mouth 2 (two) times daily.   nitroGLYCERIN 0.4 MG/SPRAY spray Commonly known as: NITROLINGUAL Place 1 spray under the tongue every 5 (five) minutes as needed for chest pain.   omeprazole 20 MG capsule Commonly known as: PRILOSEC Take 20 mg by mouth daily.   ondansetron 4 MG tablet Commonly known as: ZOFRAN Take 4 mg by mouth daily as needed for nausea or vomiting.   oxyCODONE 5 MG immediate release tablet Commonly known as: Oxy IR/ROXICODONE Take 1 tablet (5 mg total) by mouth every 6 (six) hours as needed for moderate pain. What changed:   how much to take  when to take this  reasons to take this   polyethylene glycol 17 g packet Commonly known as: MIRALAX / GLYCOLAX Take 17 g by mouth daily as  needed for mild constipation.   simvastatin 20 MG tablet Commonly known as: ZOCOR Take 20 mg by mouth daily.   Synthroid 125 MCG tablet Generic drug: levothyroxine Take 125 mcg by mouth daily.   Trelegy Ellipta 100-62.5-25 MCG/INH Aepb Generic drug: Fluticasone-Umeclidin-Vilant Inhale 1 puff into the lungs daily.        DISCHARGE INSTRUCTIONS:   Follow-up team at rehab 1 day Follow-up cardiology 2 weeks Follow-up orthopedic surgery.  If you experience worsening of your admission symptoms, develop shortness of breath, life threatening emergency, suicidal or homicidal thoughts you must seek medical attention immediately by calling 911 or calling your MD immediately  if symptoms less severe.  You Must read complete instructions/literature along with all the possible adverse reactions/side effects for all the Medicines you take and that have been prescribed to you. Take any new Medicines after you have completely understood and accept all the possible adverse  reactions/side effects.   Please note  You were cared for by a hospitalist during your hospital stay. If you have any questions about your discharge medications or the care you received while you were in the hospital after you are discharged, you can call the unit and asked to speak with the hospitalist on call if the hospitalist that took care of you is not available. Once you are discharged, your primary care physician will handle any further medical issues. Please note that NO REFILLS for any discharge medications will be authorized once you are discharged, as it is imperative that you return to your primary care physician (or establish a relationship with a primary care physician if you do not have one) for your aftercare needs so that they can reassess your need for medications and monitor your lab values.    Today   CHIEF COMPLAINT:   Chief Complaint  Patient presents with  . Weakness    HISTORY OF PRESENT ILLNESS:  Daley Mooradian  is a 78 y.o. female came in with weakness   VITAL SIGNS:  Blood pressure 131/63, pulse 78, temperature 98.1 F (36.7 C), temperature source Oral, resp. rate 18, height  (1.575 m), weight 99.5 kg, SpO2 94 %.  I/O:    Intake/Output Summary (Last 24 hours) at 08/29/2019 1310 Last data filed at 08/29/2019 0958 Gross per 24 hour  Intake 240 ml  Output 2000 ml  Net -1760 ml    PHYSICAL EXAMINATION:  GENERAL:  78 y.o.-year-old patient lying in the bed with no acute distress.  EYES: Pupils equal, round, reactive to light and accommodation. No scleral icterus. Extraocular muscles intact.  HEENT: Head atraumatic, normocephalic. Oropharynx and nasopharynx clear.  NECK:  Supple, no jugular venous distention. No thyroid enlargement, no tenderness.  LUNGS: Decreased breath sounds bilaterally, no wheezing, rales,rhonchi or crepitation. No use of accessory muscles of respiration.  CARDIOVASCULAR: S1, S2 irregularly irregular. No murmurs, rubs,  or gallops.  ABDOMEN: Soft, non-tender, non-distended. Bowel sounds present. No organomegaly or mass.  EXTREMITIES: No pedal edema, cyanosis, or clubbing.  NEUROLOGIC: Cranial nerves II through XII are intact. Muscle strength 5/5 in all extremities. Sensation intact. Gait not checked.  PSYCHIATRIC: The patient is alert and oriented x 3.  SKIN: Some bruising on the lower extreme  DATA REVIEW:   CBC Recent Labs  Lab 08/29/19 0548  WBC 10.2  HGB 10.9*  HCT 36.1  PLT 312    Chemistries  Recent Labs  Lab 08/29/19 0548  NA 138  K 3.8  CL 103  CO2 27  GLUCOSE 174*  BUN 11  CREATININE 0.86  CALCIUM 8.9  MG 1.8  AST 24  ALT 49*  ALKPHOS 137*  BILITOT 1.1     Microbiology Results  Results for orders placed or performed during the hospital encounter of 08/20/19  Urine culture     Status: None   Collection Time: 08/20/19  5:19 PM   Specimen: In/Out Cath Urine  Result Value Ref Range Status   Specimen Description   Final    IN/OUT CATH URINE Performed at [facility], [address] [place], [place] [address]    Special Requests   Final    NONE Performed at [facility], [address] [place], [place] [address]    Culture   Final    NO GROWTH Performed at [facility], [address] [place], [place] [address]    Report Status 08/21/2019 FINAL  Final  Culture, blood (routine x 2)     Status: None   Collection Time: 08/20/19  5:21 PM   Specimen: BLOOD  Result Value Ref Range Status   Specimen Description BLOOD RIGHT ANTECUBITAL  Final   Special Requests   Final    BOTTLES DRAWN AEROBIC AND ANAEROBIC Blood Culture adequate volume   Culture   Final    NO GROWTH 8 DAYS Performed at [facility], [address], [place], [place] [address]    Report Status 08/28/2019 FINAL  Final  SARS Coronavirus 2 by RT PCR (hospital order, performed in [facility] hospital lab) Nasopharyngeal Nasopharyngeal Swab     Status: None    Collection Time: 08/20/19  5:47 PM   Specimen: Nasopharyngeal Swab  Result Value Ref Range Status   SARS Coronavirus 2 NEGATIVE NEGATIVE Final    Comment: (NOTE) If result is NEGATIVE SARS-CoV-2 target nucleic acids are NOT DETECTED. The SARS-CoV-2 RNA is generally detectable in upper and lower  respiratory specimens during the acute phase of infection. The lowest  concentration of SARS-CoV-2 viral copies this assay can detect is 250  copies / mL. A negative result does not preclude SARS-CoV-2 infection  and should not be used as the sole basis for treatment or other  patient management decisions.  A negative result may occur with  improper specimen collection / handling, submission of specimen other  than nasopharyngeal swab, presence of viral mutation(s) within the  areas targeted by this assay, and inadequate number of viral copies  (<250 copies / mL). A negative result must be combined with clinical  observations, patient history, and epidemiological information. If result is POSITIVE SARS-CoV-2 target nucleic acids are DETECTED. The SARS-CoV-2 RNA is generally detectable in upper and lower  respiratory specimens dur ing the acute phase of infection.  Positive  results are indicative of active infection with SARS-CoV-2.  Clinical  correlation with patient history and other diagnostic information is  necessary to determine patient infection status.  Positive results do  not rule out bacterial infection or co-infection with other viruses. If result is PRESUMPTIVE POSTIVE SARS-CoV-2 nucleic acids MAY BE PRESENT.   A presumptive positive result was obtained on the submitted specimen  and confirmed on repeat testing.  While 2019 novel coronavirus  (SARS-CoV-2) nucleic acids may be present in the submitted sample  additional confirmatory testing may be necessary for epidemiological  and / or clinical management purposes  to differentiate between  SARS-CoV-2 and other Sarbecovirus  currently known to infect humans.  If clinically indicated additional testing with an alternate test  methodology [id number]) is advised. The SARS-CoV-2 RNA is generally  detectable in upper and lower respiratory sp ecimens during the acute  phase of infection. The expected result is Negative. Fact Sheet for Patients:  StrictlyIdeas.no Fact Sheet for Healthcare Providers: BankingDealers.co.za This test is not yet approved or cleared by the [place] FDA and has been authorized for detection and/or diagnosis of SARS-CoV-2 by FDA under an Emergency Use Authorization (EUA).  This EUA will remain in effect (meaning this test can be used) for the duration of the COVID-19 declaration under Section 564(b)(1) of the Act, 21 U.S.C. section 360bbb-3(b)(1), unless the authorization is terminated or revoked sooner. Performed at [facility], [place], [place] [address]   SARS CORONAVIRUS 2 (TAT 6-24 HRS) Nasopharyngeal Nasopharyngeal Swab     Status: None   Collection Time: 08/28/19  4:29 PM   Specimen: Nasopharyngeal Swab  Result Value Ref Range Status   SARS Coronavirus 2 NEGATIVE NEGATIVE Final    Comment: (NOTE) SARS-CoV-2 target nucleic acids are NOT DETECTED. The SARS-CoV-2 RNA is generally detectable in upper and lower respiratory specimens during the acute phase of infection. Negative results do not preclude SARS-CoV-2 infection, do not rule out co-infections with other pathogens, and should not be used as the sole basis for treatment or other patient management decisions. Negative results must be combined with clinical observations, patient history, and epidemiological information. The expected result is Negative. Fact Sheet for Patients: SugarRoll.be Fact Sheet for Healthcare Providers: https://www.woods-mathews.com/ This test is not yet approved or cleared by the  [place] FDA and  has been authorized for detection and/or diagnosis of SARS-CoV-2 by FDA under an Emergency Use Authorization (EUA). This EUA will remain  in effect (meaning this test can be used) for the duration of the COVID-19 declaration under Section 56 4(b)(1) of the Act, 21 U.S.C. section 360bbb-3(b)(1), unless the authorization is terminated or revoked sooner. Performed at [facility], [place] [address], [place], [place] [address]      Management plans discussed with the patient, family and they are in agreement.  CODE STATUS:     Code Status Orders  (From admission, onward)         Start     Ordered   08/20/19 1928  Full code  Continuous     08/20/19 1931        Code Status History    Date Active Date Inactive Code Status Order ID Comments User Context   08/10/2019 1252 08/14/2019 1809 Full Code [id number]  Hillary Bow, MD ED   Advance Care Planning Activity      TOTAL TIME TAKING CARE OF THIS PATIENT: 32 minutes.    Loletha Grayer M.D on 08/29/2019 at 1:10 PM  Between 7am to 6pm - Pager - [phone]  After 6pm go to [email or web address] - password [email or web address]  [facility]  CC: Primary care physician; Kirk Ruths, MD

## 2019-08-29 NOTE — Progress Notes (Addendum)
Progress Note  Patient Name: Destiny Zuniga Date of Encounter: 08/29/2019  Primary Cardiologist: New-Dr. Rockey Situ; [facility]  Subjective   States doing okay.  Denies chest pain or shortness of breath.  Edema is improving.  Inpatient Medications    Scheduled Meds: . apixaban  5 mg Oral BID  . Chlorhexidine Gluconate Cloth  6 each Topical Daily  . cholecalciferol  2,000 Units Oral Daily  . diltiazem  120 mg Oral Daily  . ferrous [id number] C-folic acid  1 capsule Oral BID PC  . fluticasone  2 spray Each Nare Daily  . furosemide  40 mg Intravenous Daily  . insulin aspart  0-9 Units Subcutaneous TID WC  . insulin detemir  14 Units Subcutaneous QHS  . isosorbide mononitrate  30 mg Oral BID  . levothyroxine  125 mcg Oral Daily  . loratadine  10 mg Oral Daily  . pantoprazole  40 mg Oral Daily  . senna  1 tablet Oral Daily  . sodium chloride flush  3 mL Intravenous Q12H  . umeclidinium-vilanterol  1 puff Inhalation Daily   Continuous Infusions: . sodium chloride Stopped (08/27/19 1042)   PRN Meds: sodium chloride, acetaminophen **OR** acetaminophen, albuterol, hydrALAZINE, hydrOXYzine, nitroGLYCERIN, ondansetron **OR** ondansetron (ZOFRAN) IV, oxyCODONE, polyethylene glycol, polyvinyl alcohol   Vital Signs    Vitals:   08/28/19 1523 08/28/19 1948 08/29/19 0452 08/29/19 0742  BP: (!) 128/51 (!) 151/71 (!) 145/61 131/63  Pulse: 68 83 82 78  Resp: 18 20 20 18   Temp: 97.8 F (36.6 C) 98 F (36.7 C) 97.8 F (36.6 C) 98.1 F (36.7 C)  TempSrc:  Oral Oral Oral  SpO2: 99% 98% 98% 99%  Weight:   99.5 kg   Height:        Intake/Output Summary (Last 24 hours) at 08/29/2019 1035 Last data filed at 08/29/2019 0958 Gross per 24 hour  Intake 240 ml  Output 2000 ml  Net -1760 ml   Last 3 Weights 08/29/2019 08/28/2019 08/27/2019  Weight (lbs) 219 lb 4.8 oz 227 lb 4.8 oz 227 lb 11.2 oz  Weight (kg) 99.474 kg 103.103 kg 103.284 kg      Telemetry     Atrial fibrillation, heart rate 88- Personally Reviewed   Physical Exam   GEN: No acute distress.   Neck: No JVD Cardiac:  Irregular irregular, no murmurs, rubs, or gallops.  Respiratory: Clear to auscultation bilaterally. GI: Soft, nontender, non-distended  MS: 2+ edema; No deformity. Neuro:  Nonfocal  Psych: Normal affect   Labs    High Sensitivity Troponin:   Recent Labs  Lab 08/10/19 1140 08/20/19 1543 08/20/19 2056  TROPONINIHS 14 110* 85*      Chemistry Recent Labs  Lab 08/26/19 0535 08/27/19 0358 08/28/19 0534 08/29/19 0548  NA 136 138 138 138  K 4.0 4.2 3.9 3.8  CL 105 106 104 103  CO2 23 25 27 27   GLUCOSE 150* 178* 186* 174*  BUN 16 14 12 11   CREATININE 0.90 1.07* 1.06* 0.86  CALCIUM 8.4* 8.6* 8.7* 8.9  PROT 5.2*  --  5.8* 5.8*  ALBUMIN 2.6*  --  2.9* 2.9*  AST 30  --  25 24  ALT 83*  --  59* 49*  ALKPHOS 125  --  142* 137*  BILITOT 1.3*  --  1.1 1.1  GFRNONAA >60 49* 50* >60  GFRAA >60 57* 58* >60  ANIONGAP 8 7 7 8      Hematology Recent Labs  Lab 08/27/19 0358  08/28/19 0534 08/29/19 0548  WBC 10.7* 10.9* 10.2  RBC 3.68* 3.97 4.16  HGB 9.6* 10.3* 10.9*  HCT 33.0* 33.9* 36.1  MCV 89.7 85.4 86.8  MCH 26.1 25.9* 26.2  MCHC 29.1* 30.4 30.2  RDW 24.7* 24.7* 24.4*  PLT 256 292 312    BNPNo results for input(s): BNP, PROBNP in the last 168 hours.   DDimer No results for input(s): DDIMER in the last 168 hours.   Radiology    No results found.  Cardiac Studies   08/26/19 ECHO:1. Global right ventricle has moderately reduced systolic function.The right ventricular size is mildly enlarged. No increase in right ventricular wall thickness. 2. Right atrial size was moderately dilated. 3. Severely elevated pulmonary artery systolic pressure. 4. Left ventricular ejection fraction, by visual estimation, is 60 to 65%. The left ventricle has normal function. There is no left ventricular hypertrophy. 5. Left ventricular diastolic  parameters are indeterminate. 6. Left atrial size was normal. 7. Tricuspid valve regurgitation moderate-severe. 8. The tricuspid regurgitant velocity is 4.09 m/s, and with an assumed right atrial pressure of 10 mmHg, the estimated right ventricular systolic pressure is severely elevated at 76.9 mmHg.  Patient Profile     78 y.o. female with history of CAD/PCI to LAD and LCx, heart failure preserved ejection fraction, pulmonary hypertension recent left hip fracture status post surgical repair who presents with altered mental status.  Found to be in atrial fibrillation with RVR.  Assessment & Plan    Heart rate currently well controlled 88 bpm.  She is still in atrial fibrillation.  She is net -2.7 L.  Creatinine improved from yesterday to 0.86.  New onset atrial fibrillation. -Continue Cardizem 120 mg daily Continue Eliquis 5 mg twice daily.  Pulmonary hypertension Management can be pursued as outpatient per primary cardiologist. Continue Imdur  Lower extremity edema Continue Lasix twice daily.  Plan to switch to oral on discharge. Continue to monitor creatinine.  Improved to normal today.     Signed, Debbe Odea, MD  08/29/2019, 10:35 AM

## 2019-08-29 NOTE — Progress Notes (Addendum)
Physical Therapy Treatment Patient Details Name: Destiny Zuniga MRN: [id number] DOB: [birth year] Today's Date: 08/29/2019    History of Present Illness Patient is a 78 year old female admitted from SNF with AMS, acute renal failure. S/P hip fx and ORIF 08/11/19. PMH to include: COPD, Home O2, DM, HLD, CAD, CHF, HTN.    PT Comments    Pt in chair upon entry, agreeable to participate. 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 Rest breaks provided as needed. Pt wheened down from 3L to 2L with sats remaining over 93%. Pt defers transfers training at this time d/t worsening pain after exercises. NA reports pt was able to come to standing shortly thereafter for assistance with bathing/pericare. Pt continues to progress steadily but slowly. Pt remains in good spirits, motivated to improve her strength and function, talks at length about her very many grand children, great grandchildren, and handful of great-great-grandchildren.     Follow Up Recommendations  SNF;Supervision for mobility/OOB     Equipment Recommendations  None recommended by PT    Recommendations for Other Services       Precautions / Restrictions Precautions Precautions: Fall Restrictions RLE Weight Bearing: Weight bearing as tolerated LLE Weight Bearing: Partial weight bearing Other Position/Activity Restrictions: Per PA-C Horris Latino via phone call 08/24/19 pt to be 50% PWB on the LLE until 08/31/19 at which time she may be WBAT    Mobility  Bed Mobility               General bed mobility comments: received up in chair; +1=2 with nursing to transfer STS or SPT  Transfers                 General transfer comment: deferred; pt has increased pain with  Ambulation/Gait                 Stairs             Wheelchair Mobility    Modified Rankin (Stroke Patients  Only)       Balance                                            Cognition Arousal/Alertness: Awake/alert Behavior During Therapy: WFL for tasks assessed/performed Overall Cognitive Status: Within Functional Limits for tasks assessed                                        Exercises General Exercises - Lower Extremity Ankle Circles/Pumps: AROM;Both;20 reps;Seated Long Arc Quad: AROM;AAROM;Both;15 reps;Seated Hip ABduction/ADduction: AAROM;Left;15 reps;Both;Seated;AROM Hip Flexion/Marching: AROM;AAROM;Both;15 reps;Supine Other Exercises Other Exercises: Resisted knee flexion into pillow seated 1x15x3secH bilat Other Exercises: Manually resisted hip extension 110 degrees to 80 degrees 1x15 bilat, seated in recliner    General Comments        Pertinent Vitals/Pain Pain Assessment: 0-10 Pain Score: 8  Pain Location: L hip/LE - with effort Pain Descriptors / Indicators: Guarding;Discomfort;Sore Pain Intervention(s): Limited activity within patient's tolerance;Monitored during session;Premedicated before session;Repositioned    Home Living                      Prior Function  PT Goals (current goals can now be found in the care plan section) Acute Rehab PT Goals Patient Stated Goal: to go back home after rehab PT Goal Formulation: With patient Time For Goal Achievement: 09/02/19 Potential to Achieve Goals: Good Progress towards PT goals: Progressing toward goals    Frequency    7X/week      PT Plan Current plan remains appropriate    Co-evaluation              AM-PAC PT "6 Clicks" Mobility   Outcome Measure  Help needed turning from your back to your side while in a flat bed without using bedrails?: A Lot Help needed moving from lying on your back to sitting on the side of a flat bed without using bedrails?: A Lot Help needed moving to and from a bed to a chair (including a wheelchair)?: A Lot Help  needed standing up from a chair using your arms (e.g., wheelchair or bedside chair)?: A Lot Help needed to walk in hospital room?: Total Help needed climbing 3-5 steps with a railing? : Total 6 Click Score: 10    End of Session Equipment Utilized During Treatment: Oxygen Activity Tolerance: Patient tolerated treatment well Patient left: with call bell/phone within reach;in chair;with chair alarm set Nurse Communication: Mobility status PT Visit Diagnosis: Unsteadiness on feet (R26.81);Muscle weakness (generalized) (M62.81);Difficulty in walking, not elsewhere classified (R26.2);History of falling (Z91.81);Pain;Other abnormalities of gait and mobility (R26.89) Pain - Right/Left: Left Pain - part of body: Hip     Time: 8546-2703 PT Time Calculation (min) (ACUTE ONLY): 29 min  Charges:  $Therapeutic Exercise: 23-37 mins                     12:25 PM, 08/29/19 Etta Grandchild, PT, DPT Physical Therapist - [facility]  [phone] ([place])     , C 08/29/2019, 12:17 PM

## 2019-12-17 DEATH — deceased

## 2020-06-29 IMAGING — CR DG CHEST 1V PORT
1 series · 1 of 1 positions shown · non-contrast
Comparison: None.

CLINICAL DATA: Altered mental status, weakness

EXAM:
PORTABLE CHEST 1 VIEW

[chest ap]
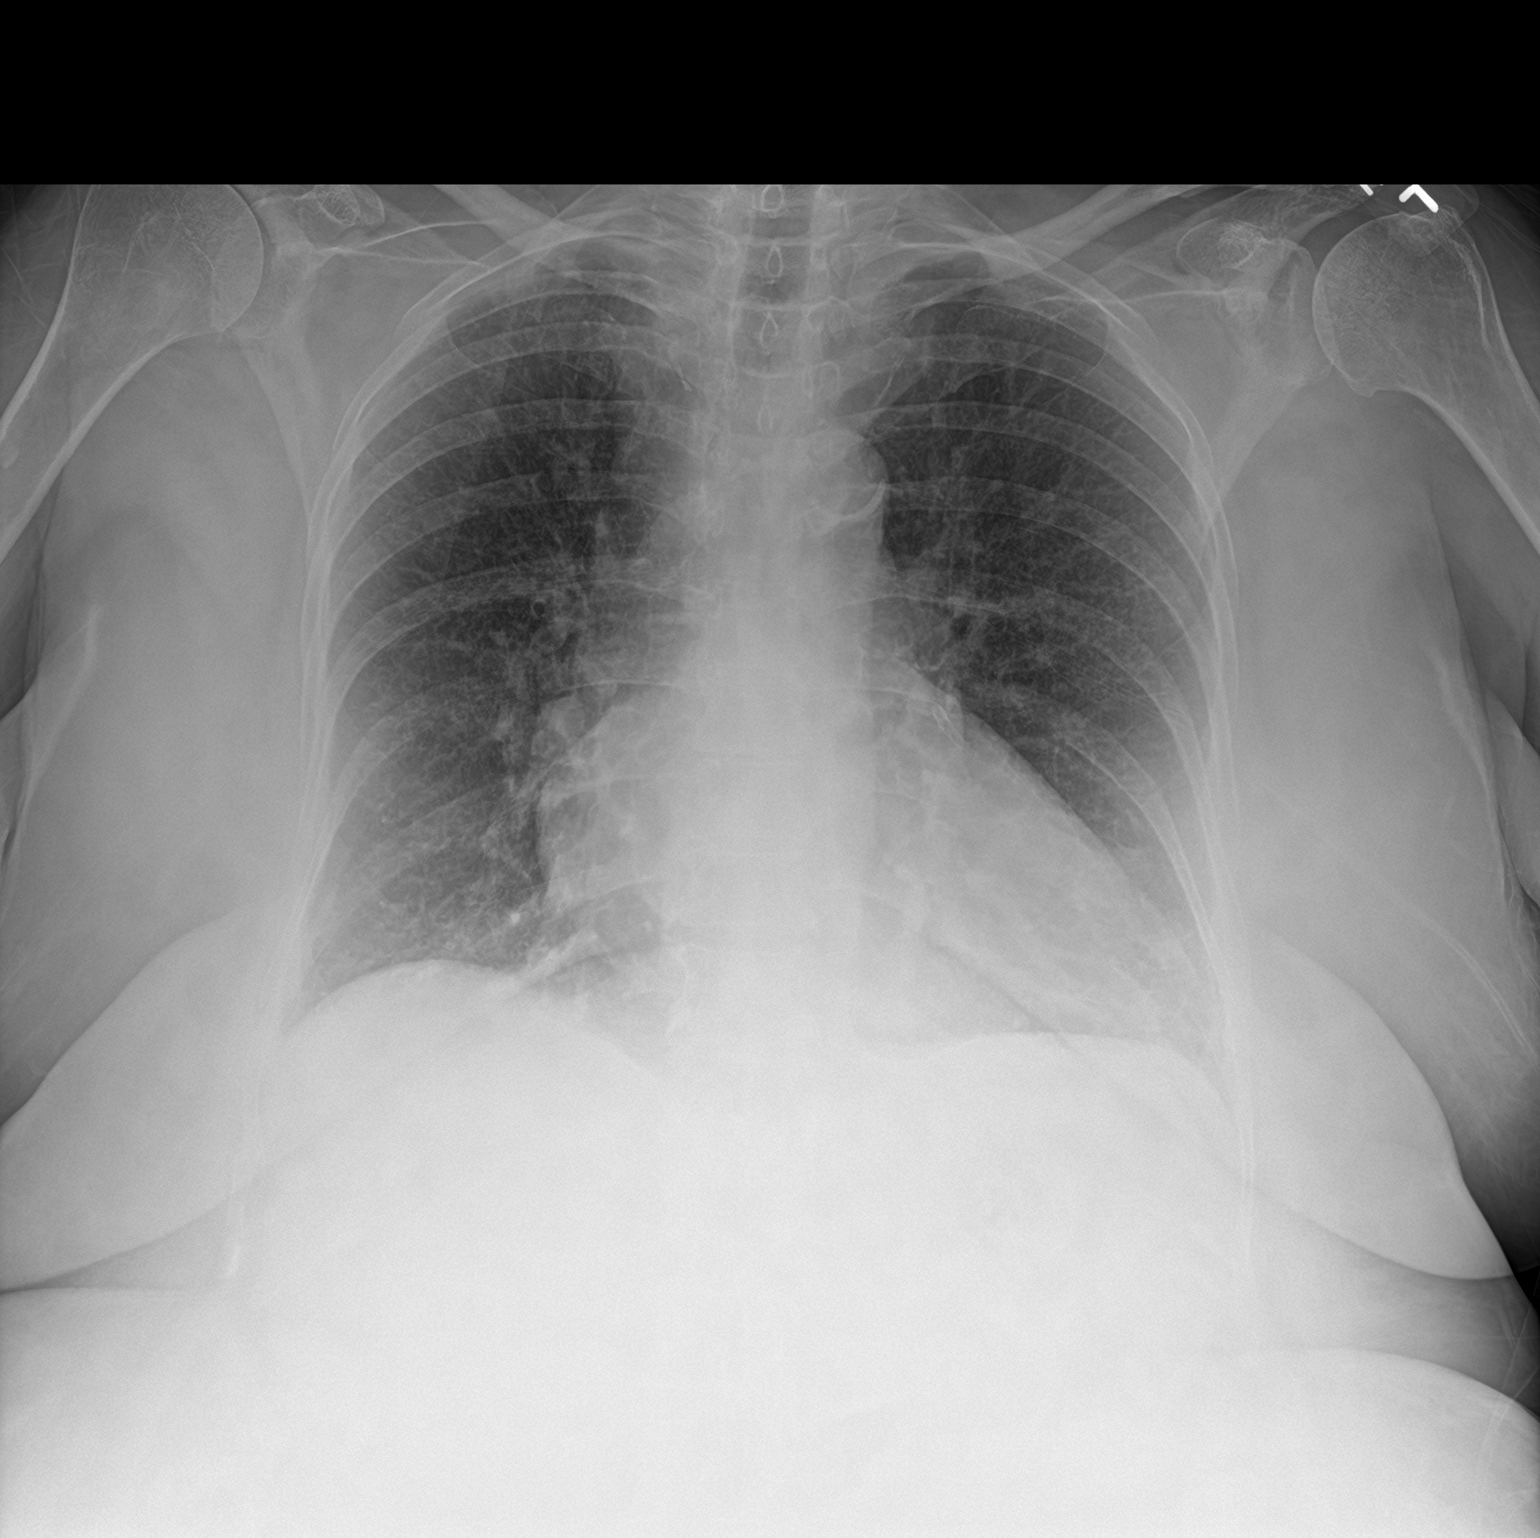

[1 of 1 positions shown; findings below may reference images not displayed]

FINDINGS: The heart size and mediastinal contours are within normal limits.
Calcific aortic knob. Coronary artery calcification. No focal
airspace consolidation, pleural effusion, or pneumothorax.
IMPRESSION: No acute cardiopulmonary findings.
